# Patient Record
Sex: Female | Born: 1955 | Race: White | Hispanic: No | Marital: Single
Health system: Midwestern US, Community
[De-identification: ages and names within clinical notes are randomized; demographics above are authoritative.]

## PROBLEM LIST (undated history)

## (undated) DIAGNOSIS — J189 Pneumonia, unspecified organism: Secondary | ICD-10-CM

## (undated) DIAGNOSIS — Z9289 Personal history of other medical treatment: Secondary | ICD-10-CM

## (undated) DIAGNOSIS — Z8719 Personal history of other diseases of the digestive system: Secondary | ICD-10-CM

## (undated) DIAGNOSIS — F909 Attention-deficit hyperactivity disorder, unspecified type: Secondary | ICD-10-CM

## (undated) DIAGNOSIS — I341 Nonrheumatic mitral (valve) prolapse: Secondary | ICD-10-CM

## (undated) HISTORY — PX: TUBAL LIGATION: SHX77

## (undated) HISTORY — PX: VAGINAL HYSTERECTOMY: SUR661

## (undated) HISTORY — PX: DE QUERVAIN'S RELEASE: SHX1439

## (undated) HISTORY — PX: FRACTURE SURGERY: SHX138

## (undated) HISTORY — PX: TONSILLECTOMY: SUR1361

---
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
## 2003-11-10 HISTORY — PX: ROUX-EN-Y GASTRIC BYPASS: SHX1104

## 2003-11-10 HISTORY — PX: CHOLECYSTECTOMY: SHX55

## 2005-11-09 DIAGNOSIS — Z9289 Personal history of other medical treatment: Secondary | ICD-10-CM

## 2005-11-09 HISTORY — DX: Personal history of other medical treatment: Z92.89

## 2013-07-31 ENCOUNTER — Emergency Department (HOSPITAL_COMMUNITY): Payer: BC Managed Care – PPO

## 2013-07-31 ENCOUNTER — Emergency Department (HOSPITAL_COMMUNITY)
Admission: EM | Admit: 2013-07-31 | Discharge: 2013-07-31 | Disposition: A | Discharge status: Home / Self Care | Payer: BC Managed Care – PPO | Attending: Emergency Medicine | Admitting: Emergency Medicine

## 2013-07-31 DIAGNOSIS — Z79899 Other long term (current) drug therapy: Secondary | ICD-10-CM | POA: Insufficient documentation

## 2013-07-31 DIAGNOSIS — K920 Hematemesis: Secondary | ICD-10-CM | POA: Insufficient documentation

## 2013-07-31 DIAGNOSIS — Z9889 Other specified postprocedural states: Secondary | ICD-10-CM | POA: Insufficient documentation

## 2013-07-31 DIAGNOSIS — Z880 Allergy status to penicillin: Secondary | ICD-10-CM | POA: Insufficient documentation

## 2013-07-31 LAB — COMPREHENSIVE METABOLIC PANEL
ALT: 18 U/L (ref 0–35)
AST: 28 U/L (ref 0–37)
Albumin: 3.9 g/dL (ref 3.5–5.2)
CO2: 19 mEq/L (ref 19–32)
Calcium: 9 mg/dL (ref 8.4–10.5)
Chloride: 99 mEq/L (ref 96–112)
Creatinine, Ser: 0.55 mg/dL (ref 0.50–1.10)
Potassium: 4 mEq/L (ref 3.5–5.1)
Sodium: 133 mEq/L — ABNORMAL LOW (ref 135–145)
Total Bilirubin: 0.2 mg/dL — ABNORMAL LOW (ref 0.3–1.2)

## 2013-07-31 LAB — LIPASE, BLOOD: Lipase: 22 U/L (ref 11–59)

## 2013-07-31 LAB — CBC
Hemoglobin: 10 g/dL — ABNORMAL LOW (ref 12.0–15.0)
MCH: 21.7 pg — ABNORMAL LOW (ref 26.0–34.0)
MCV: 71.4 fL — ABNORMAL LOW (ref 78.0–100.0)
Platelets: 392 10*3/uL (ref 150–400)
RBC: 4.61 MIL/uL (ref 3.87–5.11)
RDW: 17.5 % — ABNORMAL HIGH (ref 11.5–15.5)
WBC: 7.1 10*3/uL (ref 4.0–10.5)

## 2013-07-31 LAB — PROTIME-INR
INR: 0.95 (ref 0.00–1.49)
Prothrombin Time: 12.5 seconds (ref 11.6–15.2)

## 2013-07-31 MED ORDER — SODIUM CHLORIDE 0.9 % IV SOLN
1000.0000 mL | Freq: Once | INTRAVENOUS | Status: AC
  Administered 2013-07-31: 1000 mL via INTRAVENOUS

## 2013-07-31 MED ORDER — OMEPRAZOLE 20 MG PO CPDR: 20.0000 mg | DELAYED_RELEASE_CAPSULE | Freq: Every day | ORAL | Status: DC

## 2013-07-31 MED ORDER — SUCRALFATE 1 G PO TABS: 1.0000 g | ORAL_TABLET | Freq: Four times a day (QID) | ORAL | Status: DC

## 2013-07-31 MED ORDER — SODIUM CHLORIDE 0.9 % IV SOLN
8.0000 mg/h | INTRAVENOUS | Status: DC
  Administered 2013-07-31: 8 mg/h via INTRAVENOUS
  Filled 2013-07-31 (×2): qty 80

## 2013-07-31 MED ORDER — PANTOPRAZOLE SODIUM 40 MG IV SOLR
40.0000 mg | Freq: Once | INTRAVENOUS | Status: AC
  Administered 2013-07-31: 40 mg via INTRAVENOUS
  Filled 2013-07-31: qty 40

## 2013-07-31 MED ORDER — SODIUM CHLORIDE 0.9 % IV SOLN
1000.0000 mL | INTRAVENOUS | Status: DC
  Administered 2013-07-31: 1000 mL via INTRAVENOUS

## 2013-07-31 MED ORDER — ONDANSETRON HCL 4 MG PO TABS: 4.0000 mg | ORAL_TABLET | Freq: Three times a day (TID) | ORAL | Status: DC | PRN

## 2013-07-31 NOTE — ED Provider Notes (Signed)
CSN: [id number]     Arrival date & time 07/31/13  1026 History   First MD Initiated Contact with Patient 07/31/13 1050     Chief Complaint  Patient presents with  . Emesis   (Consider location/radiation/quality/duration/timing/severity/associated sxs/prior Treatment) HPI Patient presents with concern of multiple episodes of emesis today. She states that prior to today she had no episodes of emesis, but however, had recent generalized illness, described as"flu like". Today, in the hours prior to arrival the patient had sudden onset nausea.  Subsequent she had 5 episodes of emesis.  The material seemed to coffee ground like. No concurrent chest pain, dyspnea, lightheadedness, syncope, significant abdominal pain. Patient has a notable history of prior gastric bypass surgery. And this was performed 7 years ago, after lap band procedure failed. She states that she lost 300 pounds following the bypass procedure.  No past medical history on file. No past surgical history on file. No family history on file. History  Substance Use Topics  . Smoking status: Not on file  . Smokeless tobacco: Not on file  . Alcohol Use: Not on file   OB History   No data available     Review of Systems  Constitutional:       Per HPI, otherwise negative  HENT:       Per HPI, otherwise negative  Respiratory:       Per HPI, otherwise negative  Cardiovascular:       Per HPI, otherwise negative  Gastrointestinal: Positive for nausea and vomiting. Negative for abdominal pain and diarrhea.  Endocrine:       Negative aside from HPI  Genitourinary:       Neg aside from HPI   Musculoskeletal:       Per HPI, otherwise negative  Skin: Negative.   Neurological: Negative for syncope.    Allergies  Penicillins  Home Medications   Current Outpatient Rx  Name  Route  Sig  Dispense  Refill  . amphetamine-dextroamphetamine (ADDERALL) 20 MG tablet   Oral   Take 20 mg by mouth 3 (three) times daily.          . Multiple Vitamins-Minerals (OPTISOURCE PO)   Oral   Take 1 tablet by mouth daily.          SpO2 99% Physical Exam  Nursing note and vitals reviewed. Constitutional: She is oriented to person, place, and time. She appears well-developed and well-nourished. No distress.  HENT:  Head: Normocephalic and atraumatic.  There is trace vomiting about dentures.  Patient is edentulous.  Eyes: Conjunctivae and EOM are normal.  Cardiovascular: Normal rate and regular rhythm.   Pulmonary/Chest: Effort normal and breath sounds normal. No stridor. No respiratory distress.  Abdominal: Soft. Normal appearance. She exhibits no distension. There is no tenderness.  Musculoskeletal: She exhibits no edema.  Neurological: She is alert and oriented to person, place, and time. No cranial nerve deficit.  Skin: Skin is warm and dry.  Psychiatric: She has a normal mood and affect.    ED Course  Procedures (including critical care time) Labs Review Labs Reviewed  CBC  COMPREHENSIVE METABOLIC PANEL  LACTIC ACID, PLASMA  LIPASE, BLOOD  PROTIME-INR   Imaging Review No results found.  12:56 PM On repeat exam the patient is sitting upright.  Vital signs are stable.  We discussed her laboratory findings.  She states that her hemoglobin is actually increased from her last lab check.  Update: I discussed the patient's case with our gastroenterologist on  call.we'll repeat labs, history.  Patient will follow up in the office promptly.  This patient with history of gastric bypass  MDM  No diagnosis found.  This patient with a history of gastric bypass now presents after several episodes of emesis.  On exam she is awake and alert and hemodynamically stable.  Patient has no abdominal tenderness, no additional episodes of emesis, and has had no syncope, chest pain, dyspnea throughout.  Given her description of coffee ground like emesis, there is some suspicion for esophageal or gastric irritation.  Absent  distress, there is low suspicion for early systemic compromise.  Patient's labs are abnormal, though the patient states that they are actually improved from her baseline.  Patient is appropriate for discharge with close outpatient followup, which I discussed with our gastroenterology team.   Gerhard Munch, MD 07/31/13 1258

## 2013-07-31 NOTE — ED Notes (Signed)
Pt comfortable with d/c and f/u instructions. Prescriptions x3 

## 2013-07-31 NOTE — ED Notes (Signed)
EMS called out for c/o coffee ground emesis.  Pt states she started vomiting coffee ground emesis at 0600.  Pt told EMS she had vomited x 6.  Denies any other symptoms.  EMS reports that pt has not vomited with them and they have been with her since 1000.  Pt now denies nausea.  Pt states her BP is normally around 96/60.

## 2016-07-01 HISTORY — PX: ANKLE FRACTURE SURGERY: SHX122

## 2016-07-01 NOTE — Nursing Note (Signed)
Basic Admission Information - Text       Basic Admission Information Adult Entered On:  07/01/2016 15:42 EDT    Performed On:  07/01/2016 15:42 EDT by Anice PaganiniKREIDER, RN, KATHERINE A               Safety   Environmental Safety in Place :   Adequate room lighting, Bed exit alarm, Bed in low position, Call device within reach   Demonstrates Ability to Use Call Light :   Yes   Room Orientation/Facility Policy Reviewed With :   Patient   Ephraim HamburgerKREIDER, RN, KATHERINE A - 07/01/2016 15:42 EDT

## 2016-07-01 NOTE — Nursing Note (Signed)
Medication Administration Follow Up-Text       Medication Administration Follow Up Entered On:  07/01/2016 19:07 EDT    Performed On:  07/01/2016 19:21 EDT by Milagros EvenerHUNNICUTT, RN, Foy GuadalajaraSUZANNE R      Intervention Information:     morphine  Performed by Milagros EvenerHUNNICUTT, RN, SUZANNE R on 07/01/2016 19:06:00 EDT       morphine,5mg   IV Push,Forearm, Mid Right,other (see comment)       Medication Effectiveness Evaluation   Medication Administration Reason :   Pain   Medication Response :   Continue to observe for symptoms   Milagros EvenerHUNNICUTT, RRosalita Chessman, SUZANNE R - 07/01/2016 19:07 EDT

## 2016-07-01 NOTE — Nursing Note (Signed)
Medication Administration Follow Up-Text       Medication Administration Follow Up Entered On:  07/01/2016 13:11 EDT    Performed On:  07/01/2016 13:10 EDT by Marliss Czar, RN, Bridgette Habermann      Intervention Information:     morphine  Performed by Marliss Czar, RN, Bridgette Habermann on 07/01/2016 11:38:00 EDT       morphine,2mg   IV Push,Forearm, Mid Right,moderate pain (4-7)       Medication Effectiveness Evaluation   Medication Administration Reason :   Pain   Medication Effective :   Yes   Medication Response :   Symptoms improved, Continue to observe for symptoms, Provider notified, Other: still c/ pain 10/10 after splinting, PA ordered dilaudid   Marliss Czar, RN, Bridgette Habermann - 07/01/2016 13:10 EDT

## 2016-07-01 NOTE — Procedures (Signed)
 IntraOp Record - [facility]             IntraOp Record - [facility] Summary                                                                   Primary Physician:        FRITZ SHARPER    Case Number:              [id number]    Finalized Date/Time:      07/01/16 22:08:05    Pt. Name:                 Anita Vazquez, Anita Vazquez    D.O.B./Sex:               04-16-1956    Female    Med Rec #:                [id number]    Physician:                FRITZ SHARPER    Financial #:              [id number]    Pt. Type:                 I    Room/Bed:                 0513/01    Admit/Disch:              07/01/16 11:20:00 -    Institution:       [facility] - Case Times                                                                                                         Entry 1                                                                                                          Patient      In Room Time             07/01/16 17:13:00               Out Room Time                   07/01/16 18:48:00    Anesthesia     Procedure  Start Time               07/01/16 17:44:00               Stop Time                       07/01/16 18:45:00    Last Modified By:         Jereld OBIE Camellia GORMAN                              07/01/16 18:47:16      SFOR - Case Times Audit                                                                          07/01/16 18:47:16         Owner: [employer]                               Modifier: HANSER                                                        <+> 1         Out Room Time        <+> 1         Stop Time        SFOR - Safety Checklist - Sign In                                                               Pre-Care Text:            A.10 Confirms patient identity A.20 Verifies operative procedure, surgical site, and laterality A.20.1 Verifies           consent for planned procedure A.30 Verifies allergies                              Entry 1                                                                                                           History/Physical on       Yes  Procedure Consent               Yes    Chart                                                     on Chart     Site Marked (if           Yes    applicable)     Last Modified By:         Jereld OBIE Camellia GORMAN                              07/01/16 18:00:01    Post-Care Text:            E.30 Evaluates verification process for correct patient, site, side, and level surgery      S[facility] - Case Attendance                                                                                                    Entry 1                         Entry 2                         Entry 3                                          Case Attendee             WILDSTEIN-MD,  MICHAEL          GERDING-MD,  REX ELSIE Jereld, RN, Camellia GORMAN    Role Performed            Surgeon Primary                 Anesthesiologist                Circulator    Time In                   07/01/16 17:13:00               07/01/16 17:13:00               07/01/16 17:13:00    Time Out                  07/01/16 18:48:00               07/01/16 18:48:00               07/01/16 18:48:00    Procedure  Ankle ORIF(Right, Ankle)        Ankle ORIF(Right, Ankle)        Ankle ORIF(Right, Ankle)    Last Modified By:         Jereld, RN, Camellia GORMAN Jereld, RN, Camellia GORMAN Jereld, RNCamellia GORMAN                              07/01/16 18:47:17               07/01/16 18:47:17               07/01/16 18:47:17                                Entry 4                         Entry 5                         Entry 6                                          Case Attendee             SHERRILEE,  JESSICA LITTIE CARALEE VINIE DELENA           MASSE-CRNA,  LYNWOOD CHARLESTON    Role Performed            Surgical Scrub                  Anesthesiologist                CRNA    Time In                    07/01/16 17:13:00               07/01/16 18:00:00               07/01/16 18:25:00    Time Out                  07/01/16 18:48:00               07/01/16 18:19:00               07/01/16 18:48:00    Procedure                 Ankle ORIF(Right, Ankle)        Ankle ORIF(Right, Ankle)  Ankle ORIF(Right, Ankle)    Last Modified By:         Jereld, RN, Camellia GORMAN Jereld, RN, Camellia GORMAN Jereld, RNCamellia GORMAN                              07/01/16 18:47:17               07/01/16 18:47:17               07/01/16 18:47:17      SFOR - Case Attendance Audit                                                                     07/01/16 18:47:17         Owner: [employer]                               Modifier: HANSER                                                            1     <+> Time Out            1     <*> Procedure                              Ankle ORIF(Right, Ankle)            2     <+> Time Out            2     <*> Procedure                              Ankle ORIF(Right, Ankle)            3     <+> Time Out            3     <*> Procedure                              Ankle ORIF(Right, Ankle)            4     <+> Time Out            4     <*> Procedure                              Ankle ORIF(Right, Ankle)            5     <*> Procedure  Ankle ORIF(Right, Ankle)            6     <+> Time Out            6     <*> Procedure                              Ankle ORIF(Right, Ankle)     07/01/16 18:25:48         Owner: [employer]                               Modifier: HANSER                                                        <+> 6         Case Attendee        <+> 6         Role Performed        <+> 6         Time In        <+> 6         Procedure     07/01/16 18:19:17         Owner: [employer]                               Modifier: HANSER                                                            2     <-> Time Out                               07/01/16 18:01:00            2      <*> Procedure                              Ankle ORIF(Right, Ankle)            5     <+> Time Out            5     <*> Procedure                              Ankle ORIF(Right, Ankle)     07/01/16 18:17:54         Owner: [employer]                               Modifier: HANSER  1     <*> Procedure                              Ankle ORIF(Right, Ankle)            2     <+> Time In            2     <+> Time Out            2     <*> Procedure                              Ankle ORIF(Right, Ankle)            3     <+> Time In            3     <*> Procedure                              Ankle ORIF(Right, Ankle)            4     <+> Time In            4     <*> Procedure                              Ankle ORIF(Right, Ankle)        <+> 5         Case Attendee        <+> 5         Role Performed        <+> 5         Time In        <+> 5         Procedure     07/01/16 17:51:26         Owner: [employer]                               Modifier: HANSER                                                            1     <+> Time In            1     <*> Procedure                              Ankle ORIF(Right, Ankle)        <+> 2         Case Attendee        <+> 2         Role Performed        <+> 2         Procedure        <+> 3         Case Attendee        <+> 3         Role Performed        <+>  3         Procedure        <+> 4         Case Attendee        <+> 4         Role Performed        <+> 4         Procedure        SFOR - Skin Assessment                                                                          Pre-Care Text:            A.240 Assesses baseline skin condition Im.120 Implements protective measures to prevent skin or tissue injury           due to mechanical sources  Im.280.1 Implements progective measures to prevent skin or tissue injury due to           thermal sources Im.360 Monitors for signs and symptons of infection                              Entry 1                                                                                                           Skin Integrity            Not Intact/Abnormality          Abnormality                     RT. ANKLE IN SUGAR TONG                                                              Description                     SPLINT, REMOVED PER DR.                                                                                              ANDA PRIOR TO  PROCEDURE    Last Modified By:         Jereld, RN, Camellia RAMAN                              07/01/16 18:00:57    Post-Care Text:            E.10 Evaluates for signs and symptoms of physical injury to skin and tissue E.270 Evaluate tissue perfusion           O.60 Patient is free from signs and symptoms of injury caused by extraneous objects   O.210 Patinet's tissue           perfusion is consistent with or improved from baseline levels      SFOR - Patient Positioning                                                                      Pre-Care Text:            A.240 Assesses baseline skin condition A.280 Identifies baseline musculoskeletal status A.280.1 Identifies           physical alterations that require additional precautions for procedure-specific positioning A.510.8 Maintains           patient's dignity and privacy Im.120 Implements protective measures to prevent skin/tissue injury due to           mechanical sources Im.40 Positions the patient Im.80 Applies safety devices                              Entry 1                                                                                                          Procedure                 Ankle ORIF(Right, Ankle)        Body Position                   Supine    Left Arm Position         Extended on Padded Arm          Right Arm Position              Extended on Padded Arm                              Board w/Security Strap  Board w/Security Strap    Left Leg Position         Extended Security Strap         Right Leg Position              Held on [employer] Uncrossed            Yes                             Pressure Points                 Yes                                                              Checked     Positioning Device        Rolled Blanket, Pillow          Positioned By                   [place], RN, Camellia GORMAN RECORDS,  MICHAEL    Outcome Met (O.80)        Yes    Last Modified By:         Jereld RN, Camellia GORMAN                              07/01/16 17:59:51    Post-Care Text:            E.10 Evaluates for signs and symptoms of physical injury to skin and tissue E.290 Evaluates musculoskeletal           status O.80 Patient is free from signs and symptoms of injury related to positioning O.120 the patient is free           from signs and symptoms of injury related to transfer/transport  O.250 Patient's musculoskeletal status is           maintained at or improved from baseline levels    General Comments:            BUMP UNDER RT. HIP      SFOR - Skin Prep                                                                                Pre-Care Text:  A.30 Verifies allergies A.20 Verifies procedure, surgical site, and laterality A.510.8 Maintains paritnet's           dignity and privacy Im.270 Performs Skin Preparation Im.270.1 Implements protective measures to prevent skin           and tissue injury due to chemical sources  A.300.1 Protects from cross-contamination                              Entry 1                                                                                                          Hair Removal     Skin Prep      Prep Agents (Im.270)     Chlorhexidine Gluconate         Prep Area (Im.270)              Ankle                              2% w/Alcohol     Prep By                   FRITZ SHARPER    Outcome Met (O.100)       Yes    Last Modified By:         Jereld, RN, Camellia RAMAN                              07/01/16 18:01:41    Post-Care Text:            E.10 Evaluates for signs and symptoms of physical injury to skin and tissue O.100 Patient is free from signs           and symptoms of chemical injury  O.740 The patient's right to privacy is maintained    General Comments:            PRE PREP PER DR. ANDA WITH CHLORAHEXIDINE SCRUB BRUSH OF THE FOOT AND ANKLE      SFOR - Counts Initial and Final                                                                 Pre-Care Text:            A.20 Verifies operative procedure, sugical site, and laterality A.20.2 Assesses the risk for unintended           retained foreign body Im.20 Performs required counts                              Entry  1                                                                                                          Initial Counts      Initial Counts           Hanselman, RN, Camellia RAMAN,          Items included in               Sponges, Sharps     Performed By             [employer],  JESSICA L            the Initial Count     Final Counts      Final Counts             Hanselman, RN, Camellia RAMAN,          Final Count Status              Correct     Performed By             SHERRILEE,  JESSICA L     Items Included in        Sponges, Sharps     Final Count     Outcome Met (O.20)        Yes    Last Modified By:         Jereld RNCamellia RAMAN                              07/01/16 18:47:54    Post-Care Text:            E.50 Evaluates results of the surgical count O.20 Patient is free from unintended retained foreign objects      SFOR - Counts Initial and Final Audit                                                            07/01/16 18:47:54         Owner: [employer]                               Modifier: HANSER                                                        <+> 1         Final Count Status        SFOR - Counts  Additional  Pre-Care Text:            A.20 Verifies operative procedure, sugical site, and laterality A.20.2 Assesses the risk for unintended           retained foreign body Im.20 Performs required counts                              Entry 1                                                                                                          Additional Count          Closing Count                   Additional Count                MCKENZIE,  JESSICA L,    Type                                                      Participants                    Hanselman, RN, Camellia RAMAN    Count Status              Correct                         Items Counted                   Sponges, Sharps    Outcome Met (O.20)        Yes    Last Modified By:         Jereld RN, Camellia RAMAN                              07/01/16 18:18:14    Post-Care Text:            E.50 Evaluates results of the surgical count O.20 Patient is free from unintended retained foreign objects      SFOR - General Case Data                                                                        Pre-Care Text:            A.350.1 Classifies surgical wound  Entry 1                                                                                                          Case Information      ASA Class                1                               Case Level                      Level 3     OR                       SF 08                           Specialty                       Orthopedic (SN)     Wound Class              1-Clean    Preop Diagnosis           RIGHT BIMALLEOLAR                              FRACTURE    Last Modified By:         Jereld OBIE Camellia GORMAN                              07/01/16 19:08:56    Post-Care Text:            O.760 Patient receives consistent and comparable care regardless of the setting      [facility] - General Case Data Audit                                                                    07/01/16 19:08:56         Owner: [employer]                               Modifier: HANSER                                                            1     <*> OR  SF Add On        SFOR - Fire Risk Assessment                                                                                               Entry 1                                                                                                          Fire Risk                 Alcohol Based Prep              Fire Risk Score                 2    Assessment: If            Solution, Ignition    checked, checkmark        Source In Use    = 1 point     Last Modified By:         Jereld OBIE Camellia GORMAN                              07/01/16 17:58:55      SFOR - Safety Checklist - Time Out                                                              Pre-Care Text:            A.10 Confirms patient identity A.20 Verifies operative procedure, surgical site, and laterality A.20.1 Verifies           consent for planned procedure A.30 Verifies allergies                              Entry 1  Surgical/Procedure        Yes                             Time Out Complete               07/01/16 17:38:00    Team confirms     correct patient,     correct site and     correct procedure     Last Modified By:         Jereld RN, Camellia RAMAN                              07/01/16 18:00:17    Post-Care Text:            E.30 Evaluates verification process for correct patient, site, side, and level surgery      SFOR - Cautery                                                                                  Pre-Care Text:            A.240 Assesses baseline skin condition A280.1 Identifies baseline musculoskeletal status Im.50 Implements           protective measures to prevent injury due to electrical sources  Im.60 Uses  supplies and equipment within safe           parameters Im.80 Applies safety devices                              Entry 1                                                                                                          ESU Type                  GENERATOR                       Identification                  [id number]                              [employer]OVIDI[employer]              Number     Coag Setting (watts)      35                              Cut Setting (  watts)             1    Grounding Pad             Yes                             Grounding Pad Site              Thigh, right    Needed?     Grounding Pastor Neptune, RN, Camellia RAMAN           Outcome Met (O.10)              Yes    Applied By     Last Modified By:         Neptune RNCamellia RAMAN                              07/01/16 17:57:52    Post-Care Text:            E.10 Evaluates for signs and symptoms of physical injury to skin and tissue O.10 Patient is free from signs and           symptoms of injury related to thermal sources  O.70 Patient is free from signs and symptoms of electrical injury      SFOR - Patient Care Devices                                                                     Pre-Care Text:            A.200 Assesses risk for normothermia regulation A.40 Verifies presence of prosthetics or corrective devices           Im.280 Implements thermoregulation measures Im.60 Uses supplies and equipment within safe parameters                              Entry 1                                                                                                          Equipment Type            MACHINE SEQUENTIAL              SCD Sleeve Site                 Leg Right                              COMPRESSION    Equipment/Tag Number  [id number]                           Initiated Pre                   Yes                                                              Induction     Last Modified By:         Jereld RN, Camellia RAMAN                               07/01/16 17:59:23    Post-Care Text:            E.10 Evaluates signs and symptoms of physical injury to skin and tissue O.60 Patient is free from signs and           symptoms of injury caused by extraneous objects      SFOR - Tourniquet                                                                               Pre-Care Text:            A.20 Verifies procedure, surgical site, and laterality A.220.2 Identifies baseline tissue perfusion A.240           Assesses baseline skin condition A.40 Verifies presence of prosthetics or corrective devices Im.120 Implements           protective measures to prevent skin or tissue injury due to mechanical sources  Im.60 Uses supplies and           equipement within safe parameters Im.80 Applies safety devices                              Entry 1                                                                                                          Tourniquet Type           TOURNIQUET PNEUMATIC            Serial Number                   [id number]    Setting                   250 mmHg  Placement                       Ankle Right    Padding (Im.120)          Yes    Tourniquet Times      Inflated                 07/01/16 17:43:00               Deflated                        07/01/16 18:36:00    Applied By                Jereld RN, Eric S           Outcome Met (O.60)              Yes    Last Modified By:         Jereld, RN, Camellia RAMAN                              07/01/16 18:41:34    Post-Care Text:            E.10 Evaluates for signs and symptoms of physical injury to skin and tissue E. 270 Evaluates tissue perfusion           O.210 Patient's tissue perfusion is consisent with or improved from baseline levels O.30 Patient's procedure is           performed on the correct site, side, level O.60 Patient is free from sign and symptoms of injury caused by           extraneous objects    General Comments:            53 MINUTES      SFOR - Tourniquet Audit                                                                           07/01/16 18:41:34         Owner: [employer]                               Modifier: HANSER                                                            1     <*> Tourniquet Type            1     <*> Tourniquet Type            1     <*> Tourniquet Type            1     <*> Tourniquet Type  TOURNIQUET PNEUMATIC            1     <*> Tourniquet Type                        TOURNIQUET PNEUMATIC            1     <*> Tourniquet Type                        TOURNIQUET PNEUMATIC            1     <*> Tourniquet Type                        TOURNIQUET PNEUMATIC            1     <*> Deflated            1     <*> Deflated            1     <*> Deflated        SFOR - Implants/Endoscopy Stents                                                                Pre-Care Text:            A.20 Verifies operative procedure, surgical site, and laterality A.20.1 Verifies consent for planned procedure           Im.350 Records implants inserted during the operative or invasive procedure                              Entry 1                         Entry 2                         Entry 3                                          Implant/Explant           Implant                         Implant                         Implant    Catalog #                [id number]                         212.103    Implant     Identification      Description              SCREW CORTEX 3.5MM X  SCREW CANCELLOUS 4.0MM          SCREW LOCKING 3.5MM X                              SYNTHES 204.814            X F/T SYNTHES              SYNTHES 212.103                                                              206.012     Expiration Date      Lot Number      Unique ID Number      Manufacturer             [employer]                      [employer]                      [employer]      Serial Number     Usage Data      Implant Site              RT. ANKLE                       RT. ANKLE                       RT. ANKLE     Quantity                 2                               1                               1     Last Modified By:         Jereld, RN, Camellia GORMAN Jereld, RN, Camellia GORMAN Jereld, RNCamellia GORMAN                              07/01/16 18:12:02               07/01/16 18:12:02               07/01/16 18:12:02                                Entry 4                         Entry 5  Implant/Explant           Implant                         Implant    Catalog #                [id number]                         [id number]    Implant     Identification      Description              PLATE LCP ONE-THIRD             SCREW CORTEX 3.5MM X                              TUBULAR 6 HOLE             SY[employer]THES 204.840                              SYNTHES 241.361     Expiration Date      Lot Number      Unique ID Number      Manufacturer             [employer]                      [employer]      Serial Number     Usage Data      Implant Site             RT. ANKLE                       RT. ANKLE     Quantity                 1                               2    Last Modified By:         Jereld, RN, Camellia GORMAN Jereld, RNCamellia GORMAN                              07/01/16 18:12:02               07/01/16 18:25:18    Post-Care Text:            E.30 Evaluates verification process for correct patient, site, side and level surgery O.30 Patient's procedure           is performed on the correct site, side, and level      [facility] - Implants/Endoscopy Stents Audit                                                           07/01/16 18:25:18         Owner: [employer]  Modifier: HANSER                                                        <+> 5         Description        <+> 5         Manufacturer        <+> 5         Implant Site        <+> 5          Quantity        <+> 5         Catalog #        <+> 5         Implant/Explant        [facility] - Communication                                                                            Pre-Care Text:            A.520 Identifies barriers to communication (Patient and Family Communications) A.20 Verifies operative           procedure, surgical site, and laterality (Hand-off Communications) Im.500 Provides status reports to family           members Im.150 Develops individualized plan of care                              Entry 1                                                                                                          Communication             Phone Call                      Communication By                FRITZ SHARPER    Last Modified By:         Jereld OBIE Camellia GORMAN                              07/01/16 17:58:11    Post-Care Text:            E.520 Evaluates psychosocial response to plan of care O.500 Patient or designated support person demonstrates           knowledge of the expected psychosocial responses to  the procedure E.800 Ensures continuity of care O.50           Patient's current status is communicated throughout the continuum of care      [facility] - Dressing/Packing                                                                         Pre-Care Text:            A.350 Assesses susceptibility for infection Im.250 Administers care to invasive devices Im.290 Administer care           to wound sites  Im.300 Implements aseptic technique                              Entry 1                                                                                                          Site                      Ankle                           Site Details                    Right    Dressing Item     Details      Dressing Item            Medicated Gauze, 4x4's,         Cast/Splint (Im.290)            Cast Padding, Plaster     (Im.290)                 Elastic wrap                                                     Cast    Last Modified By:         Jereld RN, Camellia RAMAN                              07/01/16 18:18:57    Post-Care Text:            E.320 Evaluate factors associted with increased risk for postoperative infection at the completion of the           procedure O.200 Patient's wound perfusion is consistent with or improved from baseline levels  O.Patient is  free from signs and symptoms of infection      [facility] - Procedures                                                                               Pre-Care Text:            A.20 Verifies operative procedure, surgical site, and laterality Im.150 Develops individualized plan of care                              Entry 1                                                                                                          Procedure     Description      Procedure                Ankle ORIF                      Modifiers                       Right, Ankle     Surgical Procedure       RIGHT ANKLE ORIF     Text     Primary Procedure         Yes                             Primary Surgeon                 FRITZ SHARPER    Start                     07/01/16 17:44:00               Stop                            07/01/16 18:45:00    Anesthesia Type           General                         Surgical Service                Orthopedic (SN)    Wound Class               1-Clean    Last Modified By:         Jereld RN, Camellia RAMAN  07/01/16 18:47:19    Post-Care Text:            O.730 The patient's care is consistent with the individualized perioperative plan of care      [facility] - Procedures Audit                                                                          07/01/16 18:47:19         Owner: [employer]                               Modifier: HANSER                                                        <+> 1         Stop        [facility] - Safety Checklist - Sign Out                                                              Pre-Care  Text:            Im.330 Manages specimen handling and disposition                              Entry 1                                                                                                          Patient Safety            Yes    Communication Guide     Used Throughout Case     Last Modified By:         Jereld OBIE Camellia GORMAN                              07/01/16 18:47:24    Post-Care Text:            E.800 Ensures continuity of care E.50 Evaluates results of the surgical count O.30 Patient's procedure is           performed on the correct site, side, and level O.50 patient's current status is communicated throughout the           continuum of  care O.40 Patient's specimen(s) is managed in the appropriate manner      [facility] - Transfer                                                                                                           Entry 1                                                                                                          Transferred By            Jereld, RN, Camellia RAMAN,          Via                             Bed                              GERDING-MD,  TRESEA FALLOW    Post-op Destination       PACU    Skin Assessment      Condition                Intact    Last Modified By:         Jereld OBIE Camellia RAMAN                              07/01/16 18:03:51      Case Comments                                                                                         <None>              Finalized By: Jereld OBIE Camellia RAMAN      Document Signatures  Signed By:           Jereld OBIE Camellia GORMAN 07/01/16 18:47          Jereld, RN, Camellia GORMAN 07/01/16 19:09          Jereld, RN, Camellia GORMAN 07/01/16 22:08      Unfinalized History                                                                                     Date/Time            Username    Reason for Unfinalizing         Freetext Reason for Unfinalizing                                           07/01/16 19:08       HANSER      Modify Pick List          07/01/16 21:59       HANSER      Modify Pick List

## 2016-07-01 NOTE — Nursing Note (Signed)
Medication Administration Follow Up-Text       Medication Administration Follow Up Entered On:  07/01/2016 18:57 EDT    Performed On:  07/01/2016 19:11 EDT by Milagros EvenerHUNNICUTT, RN, Foy GuadalajaraSUZANNE R      Intervention Information:     morphine  Performed by Milagros EvenerHUNNICUTT, RN, SUZANNE R on 07/01/2016 18:56:00 EDT       morphine,5mg   IV Push,Forearm, Mid Right,other (see comment)       Medication Effectiveness Evaluation   Medication Administration Reason :   Pain   Medication Response :   Continue to observe for symptoms   Milagros EvenerHUNNICUTT, RFoy Guadalajara, SUZANNE R - 07/01/2016 18:57 EDT

## 2016-07-01 NOTE — Nursing Note (Signed)
Medication Administration Follow Up-Text       Medication Administration Follow Up Entered On:  07/01/2016 19:37 EDT    Performed On:  07/01/2016 19:45 EDT by Milagros EvenerHUNNICUTT, RN, Foy GuadalajaraSUZANNE R      Intervention Information:     lorazepam  Performed by Milagros EvenerHUNNICUTT, RN, SUZANNE R on 07/01/2016 19:30:00 EDT       lorazepam,0.5mg   IV Push,Forearm, Mid Right,anxiety       Medication Effectiveness Evaluation   Medication Administration Reason :   Anxiety, Pain   Medication Response :   Continue to observe for symptoms   Milagros EvenerHUNNICUTT, RFoy [place], SUZANNE R - 07/01/2016 19:37 EDT

## 2016-07-01 NOTE — Nursing Note (Signed)
Adult Patient History Form-Text       Adult Patient History Entered On:  07/01/2016 14:39 EDT    Performed On:  07/01/2016 14:32 EDT by Mitzi HansenMOODY, RN, Anita CrouchJEAN Vazquez               General Info   In Charge of News (ICON) Name :   Anita LionsMolly Vazquez-daughter [phone]   MOODY, RN, Anita CrouchJEAN Vazquez - 07/01/2016 14:39 EDT   Preferred Name :   Anita Vazquez   Admitted From :   ER   Mode of Arrival on Unit :   Stretcher   Information Given By :   Self   Primary Language :   English   Pregnancy Status :   Patient denies   Has the patient received chemotherapy or biotherapy within the last 48 hours? :   No   Is the patient currently (2-3 days) receiving radiation treatment? :   No   MOODY, RN, Anita Vazquez - 07/01/2016 14:32 EDT   Problem History   (As Of: 07/01/2016 14:39:22 EDT)   Problems(Active)    Anemia (SNOMED CT  :ACSYtQDsdrRUkXtoqf79/w )  Name of Problem:   Anemia ; Recorder:   CANADY, RN, BROOKE Vazquez; Confirmation:   Confirmed ; Classification:   Medical ; Code:   ACSYtQDsdrRUkXtoqf79/w ; Contributor System:   DietitianowerChart ; Last Updated:   05/19/2016 7:01 EDT ; Life Cycle Date:   05/19/2016 ; Life Cycle Status:   Active ; Vocabulary:   SNOMED CT        Gastric bypass (SNOMED CT  :[phone] )  Name of Problem:   Gastric bypass ; Recorder:   Ronny FlurryWORTHINGTON,  Anita Vazquez; Confirmation:   Confirmed ; Classification:   Medical ; Code:   [phone] ; Contributor System:   DietitianowerChart ; Last Updated:   05/19/2016 7:07 EDT ; Life Cycle Date:   05/19/2016 ; Life Cycle Status:   Active ; Vocabulary:   SNOMED CT   ; Comments:        05/19/2016 7:07 - Ronny FlurryWORTHINGTON,  Anita Vazquez  1997        Diagnoses(Active)    Ankle injury - Minor  Date:   07/01/2016 ; Diagnosis Type:   Reason For Visit ; Confirmation:   Complaint of ; Clinical Dx:   Ankle injury - Minor ; Classification:   Medical ; Clinical Service:   Emergency medicine ; Code:   PNED ; Probability:   0 ; Diagnosis Code:   [id number]      Fall  Date:   07/01/2016 ; Diagnosis Type:   Discharge ; Confirmation:    Confirmed ; Clinical Dx:   Fall ; Classification:   Medical ; Clinical Service:   Non-Specified ; Code:   ICD-10-CM ; Probability:   0 ; Diagnosis Code:   [id number]      Fx bimalleolar-closed  Date:   07/01/2016 ; Diagnosis Type:   Discharge ; Confirmation:   Confirmed ; Clinical Dx:   Fx bimalleolar-closed ; Classification:   Medical ; Clinical Service:   Non-Specified ; Code:   ICD-10-CM ; Probability:   0 ; Diagnosis Code:   [id number]        Family History   Family History   (As Of: 07/01/2016 14:39:22 EDT)     Allergy   (As Of: 07/01/2016 14:39:22 EDT)   Allergies (Active)   penicillins  Estimated Onset Date:   Unspecified ; Created ByPatsi Sears:   CANADY, RN, BROOKE Vazquez; Reaction Status:   Active ; Category:  Drug ; Substance:   penicillins ; Type:   Allergy ; Severity:   Severe ; Updated By:   Annamarie Major; Reviewed Date:   07/01/2016 14:34 EDT        Immunizations   Influenza Vaccine Status :   Non-influenza season (before Oct 1st and after Mar 31st)   MOODY, RN, Anita Vazquez - 07/01/2016 14:32 EDT   ID Risk Screen   Patient Recent Travel History :   No recent travel   Family Member Travel History :   No recent travel   MOODY, RN, Anita Vazquez - 07/01/2016 14:32 EDT   Infectious Disease Risk Factor Grid   Chills :   No   Fever :   No   Fatigue :   No   Headache :   No   Runny or Stuffy Nose :   No   Sore Throat :   No   Difficulty Breathing :   No   Shortness of Breath :   No   New or Worsening Cough :   No   Wheezing :   No   Vomiting :   No   Diarrhea :   No   Abdominal (Stomach Pain) :   No   Muscle Pain :   No   Weakness/Numbness :   No   Recent Exposure to Communicable Disease :   No   Illness With Generalized Rash :   No   Abnormal Bleeding :   No   Unexplained Hemorrhage (Bleeding or Bruising) :   No   Arthralgia :   No   Conjunctivitis :   No   MOODY, RN, Anita Vazquez - 07/01/2016 14:32 EDT   MRSA/VRE Screening :   No   MOODY, RN, Anita Vazquez - 07/01/2016 14:32 EDT   Chills :   No   Cough (Any Duration) :   No   Fever :   No    Hemoptysis (Blood in Sputum) :   No   Night Sweats :   No   Weight Loss Greater Than 10 Pounds :   No   Hx of TB Now or at Any Time In the Past (Even if on Meds) :   No   Foreign-Born :   No   Homeless or In Shelter :   No   Incarcerated Within Last 2 Years :   No   Intravenous Drug User :   No   Female Homosexual :   No   New TST/IGRA Results Pos(Within 2 yrs),Hx Recent TB Exposure :   No   MOODY, RN, Anita Vazquez - 07/01/2016 14:32 EDT   Does the Patient Have any of the Following Conditions That Compromise the Immune System :   None   MOODY, RN, Anita Vazquez - 07/01/2016 14:32 EDT   C. diff Screen   Have you had 3 or more loose/watery stool in 24 hours? :   No   MOODY, RN, Anita Vazquez - 07/01/2016 14:32 EDT   Procedure History        -    Procedure History   (As Of: 07/01/2016 14:39:22 EDT)     Anesthesia Minutes:   0 ; Procedure Name:   Gastric bypass operation ; Procedure Minutes:   0 ; Last Reviewed Dt/Tm:   07/01/2016 14:35:46 EDT            Anesthesia Minutes:   0 ; Procedure Name:   C section delivery ;  Procedure Minutes:   0 ; Last Reviewed Dt/Tm:   07/01/2016 14:35:46 EDT            Anesthesia Minutes:   0 ; Procedure Name:   Cholecystectomy ; Procedure Minutes:   0 ; Last Reviewed Dt/Tm:   07/01/2016 14:35:46 EDT            Anesthesia/Sedation   Anesthesia History :   Prior general anesthesia   Anesthesia Reaction :   None   Moderate Sedation History :   Prior sedation for procedure   MOODY, RN, Anita Vazquez - 07/01/2016 14:32 EDT   Transfusion/Bloodless Med   Transfusion History :   Prior transfusion without reaction   Will Patient Accept Blood Transfusion and/or Blood Products :   Yes   MOODY, RN, Anita Vazquez - 07/01/2016 14:32 EDT   Nutrition   Home Diet :   Regular   Appetite :   Good   Feeding Ability :   Complete independence   Unintentional Weight Change Greater Than 10 lbs in the Last 6 Months :   No   MOODY, RN, Anita Vazquez - 07/01/2016 14:32 EDT   Functional   Cognitive Function Concerns Prior to Admission :   None reported   Ability to  Ambulate Prior to Admission :   Independent   Ability to Move in Bed Prior to Admission :   Independent   ADLs :   Independent   MOODY, RN, Anita CrouchJEAN Vazquez - 07/01/2016 14:32 EDT   Living and Resources   Lines/Tubes Present on Admission :   None   MOODY, RN, Anita CrouchJEAN Vazquez - 07/01/2016 14:32 EDT   Social History   Social History   (As Of: 07/01/2016 14:39:22 EDT)   Tobacco:        Never smoker, Cigarettes   (Last Updated: 07/01/2016 08:06:54 EDT by Marliss CzarLeigh, RN, Bridgette HabermannKerry A)          Alcohol:        Current, Wine, 1-2 times per week   (Last Updated: 07/01/2016 14:38:39 EDT by MOODY, RN, Anita Vazquez)            Sexual Assault/Domestic Violence Screen   Have You Been Physically Hurt by Someone Within the Past Year :   No   MOODY, RN, Anita CrouchJEAN Vazquez - 07/01/2016 14:32 EDT   Spiritual   Do You Receive Comfort From Spiritual Practices :   Yes   Religious Preference :   [employer] [place], [employer]   MOODY, RN, Anita CrouchJEAN Vazquez - 07/01/2016 14:32 EDT   Advance Directive   *Advance Directive :   No   MOODY, RN, Anita CrouchJEAN Vazquez - 07/01/2016 14:32 EDT   Educ Needs   Patient/Family Learning Style Preferences   Patient :   Verbal explanation   MOODY, RN, Anita Vazquez - 07/01/2016 14:32 EDT   Barriers to Learning :   None evident   Preventative Measures Information Given :   Hand Hygiene, Environmental Safety, Rapid Response Team (RRT) program, Unit/Room Orientation   MOODY, RN, Anita Vazquez - 07/01/2016 14:32 EDT   DC Needs   Discharge To :   Home independently   MOODY, RN, Anita CrouchJEAN Vazquez - 07/01/2016 14:32 EDT

## 2016-07-01 NOTE — Nursing Note (Signed)
Medication Administration Follow Up-Text       Medication Administration Follow Up Entered On:  07/01/2016 19:25 EDT    Performed On:  07/01/2016 19:28 EDT by Milagros Evener, RN, Foy Guadalajara      Intervention Information:     lorazepam  Performed by Milagros Evener RN, SUZANNE R on 07/01/2016 19:13:00 EDT       lorazepam,0.5mg   IV Push,Forearm, Mid Right,anxiety       Medication Effectiveness Evaluation   Medication Administration Reason :   Anxiety, Pain   HUNNICUTT, RN, SUZANNE R - 07/01/2016 19:37 EDT     Medication Response :   Continue to observe for symptoms   Milagros Evener RNFoy Guadalajara - 07/01/2016 19:25 EDT

## 2016-07-02 NOTE — Nursing Note (Signed)
Medication Administration Follow Up-Text       Medication Administration Follow Up Entered On:  07/02/2016 5:40 EDT    Performed On:  07/02/2016 3:34 EDT by Shirl HarrisASSIN, RN, EDWIN L      Intervention Information:     acetaminophen-hydrocodone  Performed by Shirl HarrisASSIN, RN, EDWIN L on 07/02/2016 02:34:00 EDT       HYDROcodone-acetaminophen,2tabs  Oral,moderate pain (4-7)       Medication Effectiveness Evaluation   Medication Administration Reason :   Pain   Medication Effective :   Yes   Medication Response :   Symptoms improved   TASSIN, RN, EDWIN L - 07/02/2016 5:39 EDT   Pain Assessment   Numeric Rating Pain Scale :   4   TASSIN, RN, EDWIN L - 07/02/2016 5:39 EDT   Image 4 -  Images currently included in the form version of this document have not been included in the text rendition version of the form.

## 2016-07-02 NOTE — Nursing Note (Signed)
Medication Administration Follow Up-Text       Medication Administration Follow Up Entered On:  07/02/2016 17:13 EDT    Performed On:  07/02/2016 12:53 EDT by Matthias Hughs'ANGELO, RN, ALEXANDRA      Intervention Information:     ondansetron  Performed by Matthias Hughs'ANGELO, RN, ALEXANDRA on 07/02/2016 12:38:00 EDT       ondansetron,4mg   IV Push,Forearm, Mid Left,nausea/vomiting       Medication Effectiveness Evaluation   Medication Administration Reason :   Nausea   Medication Effective :   Yes   Medication Response :   Symptoms improved   D'ANGELO, RN, ALEXANDRA - 07/02/2016 17:13 EDT

## 2016-07-02 NOTE — Progress Notes (Signed)
 Inpatient PT Examination - Text       Inpatient PT Examination Entered On:  07/02/2016 13:28 EDT    Performed On:  07/02/2016 13:14 EDT by STORHOLT, PT, CASSIE W               Reason for Treatment   Subjective Statement :   Pt found supine, requires min encouragement to attempt PT eval 2* pt anxiety about getting up for 1st time. RN cleared pt for session. How am I going to walk? i laid on the floor for 12 hours     *Reason for Referral :   Pt admit c fall, R ankle fx s/p ORIF 8/23    fall precautions, R LE NWB, IV    Pt reports she was ind amb s AD prior to admit. [place] c large flight of rickedy steps to enter. Owns no DME. No family/ neighbors available to A. Works as IT consultant.     *Chief Complaint :   R LE pain 6/10 c movement     STORHOLT, PT, CASSIE W - 07/02/2016 13:14 EDT   General Info   Physical Therapy Orders :   Physical Therapy Evaluation and Treatment Acute - 07/02/16 2:22:00 EDT, AM performed - prior to noon, Stop date 07/02/16 2:22:00 EDT, NWB LLE     Precautions RTF :    Communication Order, 07/02/16 2:22:00 EDT, Constant order, elevate affected ankle above heart level for 48 hours.  Ice pack to affected ankle PRN, 07/02/16 2:22:00 EDT, Ordered   Notify Provider, 07/02/16 2:22:00 EDT, Courtesy notify of patient's procedure, 07/02/16 2:22:00 EDT, 07/02/16 2:22:00 EDT, Ordered   Notify Provider Vital Signs, 07/02/16 2:22:00 EDT, SBP greater than 180, SBP less than 90, Ordered   Notify Provider Vital Signs, 07/02/16 2:22:00 EDT, O2 sat less than 90, Ordered   Notify Provider Vital Signs, 07/02/16 2:22:00 EDT, RR greater than 30, Ordered   Notify Provider Vital Signs, 07/02/16 2:22:00 EDT, HR greater than 120, HR less than 50, Ordered   Notify Provider Vital Signs, 07/02/16 2:22:00 EDT, UO less than 120 mL per 4hrs, Ordered   Notify Provider Vital Signs, 07/02/16 2:22:00 EDT, Temp greater than 38.3, Ordered   Notify Rapid Response Team, 07/02/16 2:22:00 EDT, For concerns  regarding patient condition & notify MD, 07/02/16 2:22:00 EDT, 07/02/16 2:22:00 EDT, Ordered   Oxygen Therapy, 07/02/16 2:22:00 EDT, L/min: 2, Nasal Cannula, titrate for SpO2 greater than or equal to 92%, Ordered   Communication Order, 07/01/16 14:38:00 EDT, NPO: Per Anesthesia Protocol, 07/01/16 14:38:00 EDT, 07/01/16 14:38:00 EDT, Ordered   Notify Rapid Response Team, 07/01/16 14:38:00 EDT, For concerns regarding patient condition & notify MD, 07/01/16 14:38:00 EDT, 07/01/16 14:38:00 EDT, Ordered   Home Med Hx Update Notification, 07/01/16 14:14:00 EDT, 07/01/16 14:14:00 EDT, 07/01/16 14:14:00 EDT, Ordered   Change attending to, 07/01/16 11:20:00 EDT, FRITZ SHARPER, Ordered     Orientation Assessment :   Oriented x 4   Affect/Behavior :   Fearful   Basic Command Following :   Intact   Safety/Judgment :   Intact   Pain Present :   Yes actual or suspected pain   STORHOLT, PT, CASSIE W - 07/02/2016 13:14 EDT   Problem List   (As Of: 07/02/2016 13:28:38 EDT)   Problems(Active)    Alteration in comfort: pain (SNOMED CT  :65759985 )  Name of Problem:   Alteration in comfort: pain ; Recorder:   SYSTEM,  SYSTEM; Confirmation:   Confirmed ;  Classification:   Interdisciplinary ; Code:   [id number] ; Last Updated:   07/02/2016 6:20 EDT ; Life Cycle Date:   07/02/2016 ; Life Cycle Status:   Active ; Vocabulary:   SNOMED CT   ; Comments:        07/02/2016 6:20 - SYSTEM,  SYSTEM  Problem added automatically by system based on initiation of Alteration in Comfort Plan of Care      Anemia (SNOMED CT  :ACSYtQDsdrRUkXtoqf79/w )  Name of Problem:   Anemia ; Recorder:   CANADY, RN, BROOKE N; Confirmation:   Confirmed ; Classification:   Medical ; Code:   ACSYtQDsdrRUkXtoqf79/w ; Contributor System:   PowerChart ; Last Updated:   05/19/2016 7:01 EDT ; Life Cycle Date:   05/19/2016 ; Life Cycle Status:   Active ; Vocabulary:   SNOMED CT        Gastric bypass (SNOMED CT  :7180576986 )  Name of Problem:   Gastric bypass ; Recorder:    VIKI NEARING; Confirmation:   Confirmed ; Classification:   Medical ; Code:   7180576986 ; Contributor System:   PowerChart ; Last Updated:   05/19/2016 7:07 EDT ; Life Cycle Date:   05/19/2016 ; Life Cycle Status:   Active ; Vocabulary:   SNOMED CT   ; Comments:        05/19/2016 7:07 - VIKI NEARING  1997      Impaired tissue integrity (SNOMED CT  :18596984 )  Name of Problem:   Impaired tissue integrity ; Recorder:   SYSTEM,  SYSTEM; Confirmation:   Confirmed ; Classification:   Interdisciplinary ; Code:   18596984 ; Last Updated:   07/02/2016 6:20 EDT ; Life Cycle Date:   07/02/2016 ; Life Cycle Status:   Active ; Vocabulary:   SNOMED CT   ; Comments:        07/02/2016 6:20 - SYSTEM,  SYSTEM  Problem added automatically by system based on initiation of Impaired Tissue Integrity Plan of Care        Diagnoses(Active)    Ankle injury - Minor  Date:   07/01/2016 ; Diagnosis Type:   Reason For Visit ; Confirmation:   Complaint of ; Clinical Dx:   Ankle injury - Minor ; Classification:   Medical ; Clinical Service:   Emergency medicine ; Code:   PNED ; Probability:   0 ; Diagnosis Code:   [id number]      Closed displaced bimalleolar fracture of right ankle  Date:   07/02/2016 ; Diagnosis Type:   Discharge ; Confirmation:   Confirmed ; Clinical Dx:   Closed displaced bimalleolar fracture of right ankle ; Classification:   Medical ; Clinical Service:   Non-Specified ; Code:   ICD-10-CM ; Probability:   0 ; Diagnosis Code:   D17.158J      Fall  Date:   07/01/2016 ; Diagnosis Type:   Discharge ; Confirmation:   Confirmed ; Clinical Dx:   Fall ; Classification:   Medical ; Clinical Service:   Non-Specified ; Code:   ICD-10-CM ; Probability:   0 ; Diagnosis Code:   T80.XXXA      Other abnormalities of gait and mobility  Date:   07/02/2016 ; Diagnosis Type:   Other ; Confirmation:   Differential ; Clinical Dx:   Other abnormalities of gait and mobility ; Classification:   Interdisciplinary ;  Clinical Service:   Non-Specified ; Code:   ICD-10-CM ; Probability:   0 ; Diagnosis  Code:   R26.89        Pain Assessment   Pain Location :   Ankle   Laterality :   Right   Self Report Pain :   Numeric rating scale   Numeric Pain Scale :   6   Numeric Pain Score :   6    STORHOLT, PT, CASSIE W - 07/02/2016 13:14 EDT   Home Environment   Living Environment :   Home Environment  *ADL:  Independent  Performed By:  HOLLICE, OT, STEPHEN 07/02/2016  *Cognitive-Communication Skills:  Independent  Performed By:  HOLLICE, OT, STEPHEN 07/02/2016  *Instrumental ADL:  Independent  Performed By:  HOLLICE, OT, STEPHEN 07/02/2016     Living Situation :   Home independently   Lives With :   Alone   STORHOLT, PT, CASSIE W - 07/02/2016 13:14 EDT   Stairs     Outside Stairs          Number of Stairs :    15                 STORHOLT, PT, CASSIE W - 07/02/2016 13:14 EDT         Home Environment II   Living Environment :   Home Environment  *ADL:  Independent  Performed By:  HOLLICE, OT, STEPHEN 07/02/2016  *Cognitive-Communication Skills:  Independent  Performed By:  HOLLICE, OT, STEPHEN 07/02/2016  *Instrumental ADL:  Independent  Performed By:  HOLLICE GILLIE SENIOR 07/02/2016     STORHOLT, PT, CASSIE W - 07/02/2016 13:14 EDT   Prior Functional Status   ADL :   Independent   Mobility :   Independent   Instrumental ADL :   Independent   Cognitive-Communication Skills :   Independent   STORHOLT, PT, CASSIE W - 07/02/2016 13:14 EDT   LE Range/Strength   LE Overall Range of Motion Grid   Left Lower Extremity Active Range :   Within functional limits   Right Lower Extremity Active Range :   limitations 2* pain& surgery yesterday, distal R LE in splint   STORHOLT, PT, CASSIE W - 07/02/2016 13:14 EDT   Lt Lower Extremity Strength :   Within functional limits   Rt Lower Extremity Strength :   Other: limitations 2* pain & surgery yesterday, not formally MMT   STORHOLT, PT, CASSIE W - 07/02/2016 13:14 EDT   Mobility   Mobility Grid   Supine to Sit :   Rehab  Contact guard assistance   Transfer Sit to Stand :   Rehab Minimal assistance   Transfer Stand to Sit :   Rehab Moderate assistance   (Comment: uncontrolled descent s therapist A, pt sat before close enough despite VCs for safe sequencing 2* pain & anxiety [STORHOLT, PT, CASSIE W - 07/02/2016 13:14 EDT] )   Transfer Bed to and From Chair :   Rehab Moderate assistance   STORHOLT, PT, CASSIE W - 07/02/2016 13:14 EDT   Ambulation Level :   Moderate assistance   Ambulation Quality :   pt able to keep R LE off floor, unable to completely lift  L LE off floor during swing phase/ shuffles, VCs for safety & sequencing. min - up to mod A for balance throughout   Ambulation Distance :   4 ft   Device :   Rolling walker   STORHOLT, PT, CASSIE W - 07/02/2016 13:14 EDT   Balance   Balance Tests Performed :   Other: good seated  balance, fair standing balacne c RW   STORHOLT, PT, CASSIE W - 07/02/2016 13:14 EDT   Assessment   PT Impairments or Limitations :   Ambulation deficits, Balance deficits, Endurance deficits, Pain limiting function, Range of motion deficits, Strength deficits, Transfer deficits   Discharge Recommendations :   eval 8/24 - inpatinet rehab stay     PT Treatment Recommendations :   Pt presents to acute PT 2* fall requiring R ankle ORIF c deficits in functional mobility, requiring up to mod A for bed to chair transfers. Unable to assess gait further than bed to chair on PT eval 2* pain & anxiety. Pt would benefit from acute PT while inpatinet & continued PT in post-acute setting to maximize functional independence / safety & return to high PLOF.      STORHOLT, PT, CASSIE W - 07/02/2016 13:14 EDT   Education   Responsible Learner Present for Session :   Yes   Teaching Method :   Demonstration, Explanation   STORHOLT, PT, CASSIE W - 07/02/2016 13:14 EDT   Physical Therapy Education Grid   Ambulation with Carlis Finder :   Needs further teaching, Needs practice/supervision   Bed Mobility :   Demonstrates   Bed to Chair  Transfers :   Needs further teaching, Needs practice/supervision   Physical Therapy Plan of Care :   Verbalizes understanding   Stairs :   Needs further teaching, Needs practice/supervision   STORHOLT, PT, CASSIE W - 07/02/2016 13:14 EDT   Long Term Goals   Bed, Chair, Wheelchair Goal   Bed, Chair, Wheelchair Goal :   Supervision or setup   (Comment: to be met in 8 days [STORHOLT, PT, CASSIE W - 07/02/2016 13:14 EDT] )   Toilet Transfer Goal :   Supervision or setup   Ambulation Level Surfaces Goal :   Supervision or setup   (Comment: 75 ft c RW [STORHOLT, PT, CASSIE W - 07/02/2016 13:14 EDT] )   Stairs Ambulation :   Minimal contact assistance   (Comment: 8 steps [STORHOLT, PT, CASSIE W - 07/02/2016 13:14 EDT] )   STORHOLT, PT, CASSIE W - 07/02/2016 13:14 EDT   PT LT Goals Reviewed :   Yes   STORHOLT, PT, CASSIE W - 07/02/2016 13:14 EDT   Short Term Goals   Other PT Goals Grid     Goal #1  Goal #2  Goal #3      Goal :    1. Pt no > min A amb 30 ft c RW   2. Pt CGA bed to chair transfers c RW   3. Pt ind c HEP        Status :    Initial   Initial   Initial        Comments :    to be met in 4 days                STORHOLT, PT, CASSIE W - 07/02/2016 13:14 EDT  STORHOLT, PT, CASSIE W - 07/02/2016 13:14 EDT  STORHOLT, PT, CASSIE W - 07/02/2016 13:14 EDT       PT ST Goals Reviewed :   Yes   STORHOLT, PT, CASSIE W - 07/02/2016 13:14 EDT   Plan   PT Frequency Acute :   Daily   Duration :   30    PT Duration Unit Rehab :   Days   Treatments Planned :   Balance training, Basic activities of daily living, Bed  mobility training, Functional training, Patient education, Stair training, Therapeutic activities, Transfer training   Treatment Plan/Goals Established With Patient/Caregiver :   Yes   Other PT Treatment Provided :   daily for acute PT as able   Evaluation Complete :   Yes   STORHOLT, PT, CASSIE W - 07/26/2016 13:14 EDT   Time Spent With Patient   PT Evaluation Units, Moderate Complexity :   1 Unit   PT Individual Eval Time,  Moderate Complexity :   15 minutes   PT Therapeutic Activity Units :   1 units   PT Therapeutic Activity Time :   10 minutes   PT Total Individual Min :   15    PT Treatment Time Comment :   PT eval, sup to sit to stand, amb 4 ft c RW to chair, reviewed HEP (UE punches & shld flex, antiembolisms), education re: d/c planning & safety c transfers, left in chiar c needs in reach , nsg updated   PT Total Timed Code Treatment Units :   1 units   PT Total Timed Code Min :   10    PT Total Untimed Min Ac/Outp :   15    PT Total Treatment Time Ac/Outp :   25    STORHOLT, PT, CASSIE W - 07-26-2016 13:14 EDT   PT G-Codes and Modifiers   Primary Functional Limitation Current Status (ref) :   Mobility   Mobility Current Status [phone]) :   CL At least 60% but less than 80% impaired   Current Status Selection Method :   Used clinical judgment   Primary Functional Limitation Goal Status (ref) :   Mobility   Mobility Goal Status (G-8979) :   CJ At least 20% but less than 40% impaired   Goal Status Selection Method :   Used clinical judgment   STORHOLT, PT, CASSIE W - July 26, 2016 13:14 EDT

## 2016-07-02 NOTE — Nursing Note (Signed)
Medication Administration Follow Up-Text       Medication Administration Follow Up Entered On:  07/02/2016 17:13 EDT    Performed On:  07/02/2016 15:07 EDT by Matthias Hughs, RN, ALEXANDRA      Intervention Information:     acetaminophen-oxycodone  Performed by Matthias Hughs, RN, ALEXANDRA on 07/02/2016 14:07:00 EDT       oxyCODONE-acetaminophen,1tabs  Oral,moderate pain (4-7)       Medication Effectiveness Evaluation   Medication Administration Reason :   Pain   Medication Effective :   Yes   Medication Response :   Symptoms improved   D'ANGELO, RN, ALEXANDRA - 07/02/2016 17:13 EDT

## 2016-07-02 NOTE — Nursing Note (Signed)
Medication Administration Follow Up-Text       Medication Administration Follow Up Entered On:  07/02/2016 12:42 EDT    Performed On:  07/02/2016 9:12 EDT by Matthias Hughs, RN, ALEXANDRA      Intervention Information:     acetaminophen-oxycodone  Performed by Matthias Hughs, RN, ALEXANDRA on 07/02/2016 08:12:00 EDT       oxyCODONE-acetaminophen,1tabs  Oral,moderate pain (4-7)       Medication Effectiveness Evaluation   Medication Administration Reason :   Pain   Medication Effective :   Yes   Medication Response :   Symptoms improved   D'ANGELO, RN, ALEXANDRA - 07/02/2016 12:41 EDT

## 2016-07-02 NOTE — Progress Notes (Signed)
Outpatient OT Certification Letter-Text       Outpatient OT Certification Letter Entered On:  07/02/2016 13:12 EDT    Performed On:  07/02/2016 13:10 EDT by Crisoforo Oxford OT, STEPHEN               Physician Certification   Date of Injury or Surgery :   07/01/2016 EDT   Date of Evaluation :   07/02/2016 EDT   Ordering Physician Name :   Junious Silk   Number of Visits This Interval :   1   Dear Physician :   Thank you for your referral. Below is the patient information for the stated interval of treatment. Please review, modify (if necessary), sign, and return. Thank you.   OT Certification Interval Start :   07/02/2016 EDT   OT Certification Interval End :   08/02/2016 EDT   Physician Signature Required :   Yes   Staff Physician Signature :   The physician's electronic signature noted above indicates approval of the documented Plan of Care for the stated interval.   AMMONS, OT, STEPHEN - 07/02/2016 13:10 EDT   Problem List   (As Of: 07/02/2016 13:12:03 EDT)   Problems(Active)    Alteration in comfort: pain (SNOMED CT  :16109604 )  Name of Problem:   Alteration in comfort: pain ; Recorder:   SYSTEM,  SYSTEM; Confirmation:   Confirmed ; Classification:   Interdisciplinary ; Code:   54098119 ; Last Updated:   07/02/2016 6:20 EDT ; Life Cycle Date:   07/02/2016 ; Life Cycle Status:   Active ; Vocabulary:   SNOMED CT   ; Comments:        07/02/2016 6:20 - SYSTEM,  SYSTEM  Problem added automatically by system based on initiation of Alteration in Comfort Plan of Care      Anemia (SNOMED CT  :ACSYtQDsdrRUkXtoqf79/w )  Name of Problem:   Anemia ; Recorder:   CANADY, RN, BROOKE N; Confirmation:   Confirmed ; Classification:   Medical ; Code:   ACSYtQDsdrRUkXtoqf79/w ; Contributor System:   Dietitian ; Last Updated:   05/19/2016 7:01 EDT ; Life Cycle Date:   05/19/2016 ; Life Cycle Status:   Active ; Vocabulary:   SNOMED CT        Gastric bypass (SNOMED CT  :1478295621 )  Name of Problem:   Gastric bypass ; Recorder:   Ronny Flurry; Confirmation:   Confirmed ; Classification:   Medical ; Code:   3086578469 ; Contributor System:   Dietitian ; Last Updated:   05/19/2016 7:07 EDT ; Life Cycle Date:   05/19/2016 ; Life Cycle Status:   Active ; Vocabulary:   SNOMED CT   ; Comments:        05/19/2016 7:07 - Ronny Flurry  1997      Impaired tissue integrity (SNOMED CT  :62952841 )  Name of Problem:   Impaired tissue integrity ; Recorder:   SYSTEM,  SYSTEM; Confirmation:   Confirmed ; Classification:   Interdisciplinary ; Code:   32440102 ; Last Updated:   07/02/2016 6:20 EDT ; Life Cycle Date:   07/02/2016 ; Life Cycle Status:   Active ; Vocabulary:   SNOMED CT   ; Comments:        07/02/2016 6:20 - SYSTEM,  SYSTEM  Problem added automatically by system based on initiation of Impaired Tissue Integrity Plan of Care        Diagnoses(Active)    Ankle injury - Minor  Date:   07/01/2016 ; Diagnosis Type:   Reason For Visit ; Confirmation:   Complaint of ; Clinical Dx:   Ankle injury - Minor ; Classification:   Medical ; Clinical Service:   Emergency medicine ; Code:   PNED ; Probability:   0 ; Diagnosis Code:   [id number]      Closed displaced bimalleolar fracture of right ankle  Date:   07/02/2016 ; Diagnosis Type:   Discharge ; Confirmation:   Confirmed ; Clinical Dx:   Closed displaced bimalleolar fracture of right ankle ; Classification:   Medical ; Clinical Service:   Non-Specified ; Code:   ICD-10-CM ; Probability:   0 ; Diagnosis Code:   Z61.096E      Fall  Date:   07/01/2016 ; Diagnosis Type:   Discharge ; Confirmation:   Confirmed ; Clinical Dx:   Fall ; Classification:   Medical ; Clinical Service:   Non-Specified ; Code:   ICD-10-CM ; Probability:   0 ; Diagnosis Code:   A54.XXXA      Other abnormalities of gait and mobility  Date:   07/02/2016 ; Diagnosis Type:   Other ; Confirmation:   Differential ; Clinical Dx:   Other abnormalities of gait and mobility ; Classification:   Interdisciplinary ; Clinical  Service:   Non-Specified ; Code:   ICD-10-CM ; Probability:   0 ; Diagnosis Code:   R26.89        Plan   OT Evaluation Date :   07/02/2016 EDT   OT Frequency Rehab :   Mo/Tu/We/Th/Fr   OT Duration Unit Rehab :   Days   OT Duration Rehab :   15   OT Anticipated Treatments :   Activity of daily living training, Balance training, Edema management, Mobility training, Pain management, Safety education, Therapeutic exercises   OT Certification Letter Complete :   Yes   AMMONS, OT, STEPHEN - 07/02/2016 13:10 EDT   Outpatient Review   Rehab Potential Occupational Therapy :   Good   AMMONS, OT, STEPHEN - 07/02/2016 13:10 EDT   Prior Functional Level Grid   ADL :   Independent   Bed Mobility :   Independent   Instrumental ADL :   Independent   Cognitive-Communication Skills :   Independent   AMMONS, OT, STEPHEN - 07/02/2016 13:10 EDT   OT Impairments or Limitations :   Balance deficits, Basic activity of daily living deficits, Endurance deficits, Equipment training, Mobility deficits, Pain, Strength deficits, Other: NWB status R LE   OT Clinical Assessment Summary :   Pt may benefit from acute rehab upon d/c.     AMMONS, OT, STEPHEN - 07/02/2016 13:10 EDT    Signature Line                                                 Electronically Signed On 07/02/16 01:10 PM                                               _________________________________________________  AMMONS, OT, STEPHEN                                                     Electronically Signed On 07/07/16 10:46 AM                                               _________________________________________________                                                Junious Silk                  Dicatation Date: 07/02/16 01:10 PM

## 2016-07-02 NOTE — Progress Notes (Signed)
Inpatient OT Evaluation - Text       Inpatient OT Evaluation Entered On:  07/02/2016 13:09 EDT    Performed On:  07/02/2016 12:59 EDT by AMMONS, OT, STEPHEN               Reason for Treatment   Subjective Statement :   Evaluation complete     *Reason for Referral :   s/p R ankle fx     *Chief Complaint :   R LE pain     AMMONS, OT, STEPHEN - 07/02/2016 12:59 EDT   General Information   Occupational Therapy Orders :   Occupational Therapy Evaluation and Treatment Inpatient Acute - 07/02/16 11:26:00 EDT, Soon, Stop date 07/02/16 11:26:00 EDT     Precautions RTF :   No qualifying data available.     Pain Present :   Yes actual or suspected pain   Orientation Assessment :   Oriented x 4   Affect/Behavior :   Appropriate   AMMONS, OT, STEPHEN - 07/02/2016 12:59 EDT   Problem List   (As Of: 07/02/2016 13:09:58 EDT)   Problems(Active)    Alteration in comfort: pain (SNOMED CT  :16109604 )  Name of Problem:   Alteration in comfort: pain ; Recorder:   SYSTEM,  SYSTEM; Confirmation:   Confirmed ; Classification:   Interdisciplinary ; Code:   [id number] ; Last Updated:   07/02/2016 6:20 EDT ; Life Cycle Date:   07/02/2016 ; Life Cycle Status:   Active ; Vocabulary:   SNOMED CT   ; Comments:        07/02/2016 6:20 - SYSTEM,  SYSTEM  Problem added automatically by system based on initiation of Alteration in Comfort Plan of Care      Anemia (SNOMED CT  :ACSYtQDsdrRUkXtoqf79/w )  Name of Problem:   Anemia ; Recorder:   CANADY, RN, BROOKE N; Confirmation:   Confirmed ; Classification:   Medical ; Code:   ACSYtQDsdrRUkXtoqf79/w ; Contributor System:   Dietitian ; Last Updated:   05/19/2016 7:01 EDT ; Life Cycle Date:   05/19/2016 ; Life Cycle Status:   Active ; Vocabulary:   SNOMED CT        Gastric bypass (SNOMED CT  :1478295621 )  Name of Problem:   Gastric bypass ; Recorder:   Ronny Flurry; Confirmation:   Confirmed ; Classification:   Medical ; Code:   [id number] ; Contributor System:   Dietitian ; Last Updated:   05/19/2016 7:07  EDT ; Life Cycle Date:   05/19/2016 ; Life Cycle Status:   Active ; Vocabulary:   SNOMED CT   ; Comments:        05/19/2016 7:07 - Ronny Flurry  1997      Impaired tissue integrity (SNOMED CT  :62952841 )  Name of Problem:   Impaired tissue integrity ; Recorder:   SYSTEM,  SYSTEM; Confirmation:   Confirmed ; Classification:   Interdisciplinary ; Code:   32440102 ; Last Updated:   07/02/2016 6:20 EDT ; Life Cycle Date:   07/02/2016 ; Life Cycle Status:   Active ; Vocabulary:   SNOMED CT   ; Comments:        07/02/2016 6:20 - SYSTEM,  SYSTEM  Problem added automatically by system based on initiation of Impaired Tissue Integrity Plan of Care        Diagnoses(Active)    Ankle injury - Minor  Date:   07/01/2016 ; Diagnosis Type:   Reason For Visit ;  Confirmation:   Complaint of ; Clinical Dx:   Ankle injury - Minor ; Classification:   Medical ; Clinical Service:   Emergency medicine ; Code:   PNED ; Probability:   0 ; Diagnosis Code:   [id number]      Closed displaced bimalleolar fracture of right ankle  Date:   07/02/2016 ; Diagnosis Type:   Discharge ; Confirmation:   Confirmed ; Clinical Dx:   Closed displaced bimalleolar fracture of right ankle ; Classification:   Medical ; Clinical Service:   Non-Specified ; Code:   ICD-10-CM ; Probability:   0 ; Diagnosis Code:   [id number]      Fall  Date:   07/01/2016 ; Diagnosis Type:   Discharge ; Confirmation:   Confirmed ; Clinical Dx:   Fall ; Classification:   Medical ; Clinical Service:   Non-Specified ; Code:   ICD-10-CM ; Probability:   0 ; Diagnosis Code:   [id number]      Other abnormalities of gait and mobility  Date:   07/02/2016 ; Diagnosis Type:   Other ; Confirmation:   Differential ; Clinical Dx:   Other abnormalities of gait and mobility ; Classification:   Interdisciplinary ; Clinical Service:   Non-Specified ; Code:   ICD-10-CM ; Probability:   0 ; Diagnosis Code:   R26.89        Pain Assessment   Pain Location :   Ankle   Laterality :    Right   Quality :   Aching   Self Report Pain :   Numeric rating scale   Numeric Pain Scale :   6   Numeric Pain Score :   6    AMMONS, OT, STEPHEN - 07/02/2016 12:59 EDT   OT Basic ADL   Basic ADL Grid   Eating :   Supervision or setup   Grooming :   Supervision or setup   Bathing :   Minimal contact assistance   UE Dressing :   Supervision or setup   LE Dressing :   Minimal contact assistance   AMMONS, OT, STEPHEN - 07/02/2016 12:59 EDT   ADL Comments :   supine>sit sba;  sit>stand min assist    NWB R LE     Limiting Factors :   Pain, Other: NWB status R LE   AMMONS, OT, STEPHEN - 07/02/2016 12:59 EDT   UE Strength/ROM   Upper Extremity Overall ROM Grid   Left Upper Extremity Passive Range :   Within functional limits   Left Upper Extremity Active Range :   Within functional limits   Right Upper Extremity Passive Range :   Within functional limits   Right Upper Extremity Active Range :   Within functional limits   AMMONS, OT, STEPHEN - 07/02/2016 12:59 EDT   Lt Upper Extremity Strength :   Within functional limits   AMMONS, OT, STEPHEN - 07/02/2016 12:59 EDT   Left Upper Extremity Strength Grid   Scapular Elevation :   5   Shoulder Flexion :   5   Shoulder Extension :   5   Shoulder Abduction :   5   Shoulder Adduction :   5   Shoulder External Rotation :   5   Shoulder Internal Rotation :   5   Elbow Flexion :   5   Elbow Extension :   5   Forearm Pronation :   5   Forearm Supination :   5  Wrist Flexion :   5   Wrist Extension :   5   Finger Flexion :   5   Finger Extension :   5   AMMONS, OT, STEPHEN - 07/02/2016 12:59 EDT   Rt Upper Extremity Strength :   Within functional limits   AMMONS, OT, STEPHEN - 07/02/2016 12:59 EDT   Right Upper Extremity Strength Grid   Scapular Elevation :   5   Shoulder Flexion :   5   Shoulder Extension :   5   Shoulder Abduction :   5   Shoulder Adduction :   5   Shoulder External Rotation :   5   Shoulder Internal Rotation :   5   Elbow Flexion :   5   Elbow Extension :   5   Forearm  Pronation :   5   Forearm Supination :   5   Wrist Flexion :   5   Wrist Extension :   5   Finger Flexion :   5   Finger Extension :   5   AMMONS, OT, STEPHEN - 07/02/2016 12:59 EDT   Long Term Goals   Outpatient PT Long Term Goals Rehab     Long Term Goal 1  Long Term Goal 2  Long Term Goal 3      Goal :    Mod Indep with LE ADLs   Mod Indep with toilet t/f   Maximize B UE strength/edurance          AMMONS, OT, STEPHEN - 07/02/2016 12:59 EDT  AMMONS, OT, STEPHEN - 07/02/2016 12:59 EDT  AMMONS, OT, STEPHEN - 07/02/2016 12:59 EDT       OT LT Goals Reviewed :   Yes   AMMONS, OT, STEPHEN - 07/02/2016 12:59 EDT   Short Term Goals   Eating Goal Grid     Goal #1          Assist :    Setup                AMMONS, OT, STEPHEN - 07/02/2016 12:59 EDT         Grooming Goal Grid     Goal #1          Assist :    Setup                AMMONS, OT, STEPHEN - 07/02/2016 12:59 EDT         Upper Body Dressing Short Term Goal Grid     Goal #1          Assist :    Setup                AMMONS, OT, STEPHEN - 07/02/2016 12:59 EDT         Lower Body Dressing Grid     Goal #1          Assist :    Contact guard assistance                AMMONS, OT, STEPHEN - 07/02/2016 12:59 EDT         Bathing Goal Grid     Goal #1          Assist :    Contact guard assistance                AMMONS, OT, STEPHEN - 07/02/2016 12:59 EDT         Toileting  and Transfers Goal Grid     Goal #1          Activity :    Toilet transfers              Assist :    Contact guard assistance                AMMONS, OT, STEPHEN - 07-14-16 12:59 EDT         OT ST Goals Reviewed :   Yes   AMMONS, OT, STEPHEN - July 14, 2016 12:59 EDT   Plan   OT Evaluation Date :   07-14-16 EDT   OT Frequency Acute :   Mo/Tu/We/Th/Fr   Duration :   15    Duration Unit :   Days   Estimated Hours Per Day :   Other: 15 mins per day   Planned Treatments :   Balance training, Basic Activities of Daily Living, Equipment training, Mobility training, Pain management, Patient education, Safety education, Therapeutic  activities, Therapeutic exercises, Therapeutic exercises for strengthening and ROM   Treatment Plan/Goals Established With Patient/Caregiver :   Yes   OT Evaluation Complete :   Yes   AMMONS, OT, STEPHEN - 07/14/2016 12:59 EDT   Time Spent With Patient   OT Evaluation Units, Low Complexity :   1 Unit   OT Time In :   11:45 EST   OT Time Out :   11:55 EST   OT Individual Eval Time, Low Complexity :   10 minutes   OT Total Individual Therapy Time :   10 minutes   OT Total Untimed Code Treatment Minutes :   10 minutes   OT Total Treatment Time Rehab :   10 minutes   AMMONS, OT, STEPHEN - 07-14-2016 12:59 EDT   Assessment   OT Impairments or Limitations :   Balance deficits, Basic activity of daily living deficits, Endurance deficits, Equipment training, Mobility deficits, Pain, Strength deficits, Other: NWB status R LE   Barriers to Safe Discharge OT :   Limited family support, Limited social support, Severity of deficits   OT Discharge Recommendations :   Pt may benefit from acute rehab upon d/c.     OT Treatment Recommendations :   Pt limited by pain and NWB status R LE.  Pt has 15 STE home.     AMMONS, OT, STEPHEN - 07/14/16 12:59 EDT   OT G-Codes and Modifiers   G-Code Required :   Yes   Primary Functional Limitation Reported :   Self-Care   Reason for G-Code :   Initial assessment   Status Selection Method :   Used clinical judgment   Self-Care Current Status (E-4540) :   CJ At least 20% but less than 40% impaired   Self-Care Goal Status (J-8119) :   CI At least 1% but less than 20% impaired   OT Required Sections - Self Care :   Current Status and Goal Status   AMMONS, OT, STEPHEN - 07/14/2016 12:59 EDT

## 2016-07-03 NOTE — Case Communication (Signed)
CM Discharge Planning Assessment - Text       CM Discharge Planning Assessment Entered On:  07/03/2016 12:36 EDT    Performed On:  07/03/2016 12:33 EDT by Lee,  [place] :   Living Situation: Home independently  Current Home Treatments:   Home Devices/Equipment   Professional Skilled Services:   Special Services and Community Resources:   Sensory Deficits:   Performed by: Dedra Skeens, PT, CASSIE W-07/02/16 13:14:00       Affect/Behavior :   Appropriate   Lives With :   Alone   Lives In :   Single level home   Anita Vazquez - 07/03/2016 12:33 EDT   [place]     Outside Stairs          Number of Stairs :    [place] - 07/03/2016 12:33 EDT         Living Situation :   Home independently   Job Responsibilities :   works at [employer] at Home :   External stairs   Anita Vazquez - 07/03/2016 12:33 EDT   Home Environment   ADLs :   Minimal assist   Anita Vazquez - 07/03/2016 12:33 EDT   Discharge Needs I   Previously Documented Discharge Needs :   DISCHARGE PLAN/NEEDS:  Discharge To, Anticipated: Home independently - MOODY, RN, JEAN N - 07/01/16 14:32:00  EQUIPMENT/TREATMENT NEEDS:       Previously Documented Benefits Information :   No discharge data available.     Anticipated Discharge Date :   07/03/2016 EDT   Anticipated Discharge Time Slot :   1400-1600   Discharge To :   Acute Rehab   CM Progress Note :   07/03/16 jml: Per Cassie PT yesterday, pt lives alone and will need acute rehab. She lives on [address] and would prefer [facility]. I contacted [facility] and requested an eval. Precert was started yesterday and approved today. I met w/pt; she is anxious to d/c to rehab today after she speaks w/Dr. Tamera Stands. [facility] arranged for 2 PM. She has notified her family.     Anita Vazquez - 07/03/2016 12:33 EDT   Discharge Needs II   Professional Skilled Services :   No Needs   Needs Assistance with Transportation :    Yes   Needs Assistance at Home Upon Discharge :   Yes   Discharge Planning Time Spent :   25 minutes   Anita Vazquez - 07/03/2016 12:33 EDT   Benefits   Insurance Information :   [place]   Anita Vazquez - 07/03/2016 12:33 EDT   Advance Directive   *Advance Directive :   No   Patient Wishes to Receive Further Information on Advance Directives :   No   Anita Vazquez - 07/03/2016 12:33 EDT

## 2016-07-03 NOTE — Nursing Note (Signed)
Basic Admission Information - Text       Basic Admission Information Adult Entered On:  07/03/2016 18:53 EDT    Performed On:  07/03/2016 18:52 EDT by Georgian CoWILLIAMS,  LAUREN F               Vital Signs   Temperature Oral :   36.6 degC   Peripheral Pulse Rate :   71 bpm   Respiratory Rate :   19 br/min   Systolic/ Diastolic BP :   137 mmHg   Diastolic Blood Pressure :   72 mmHg   SpO2 :   96 %   O2 Therapy :   Room air   Michaelle BirksWILLIAMS,  LAUREN F - 07/03/2016 18:52 EDT   Height/Weight   Weight Dosing :   84.3 kg(Converted to: 185 lb 14 oz, 185.850 lb)    Weight Measured :   84.3 kg(Converted to: 185 lb 14 oz, 185.850 lb)    Height/Length Measured :   160 cm(Converted to: 5 ft 3 in, 5.25 ft, 62.99 in)    BSA Measured :   1.87 m2   Body Mass Index Measured :   32.93 kg/m2   Ideal Body Weight Calculated :   52.382 kg   Michaelle BirksWILLIAMS,  LAUREN F - 07/03/2016 18:52 EDT   Valuables/Belongings   Patient Search Completed :   Not done   Michaelle BirksWILLIAMS,  LAUREN F - 07/03/2016 18:52 EDT   Clothing Valuables Belongings Grid     Clothes, Patient Valuables          At Bedside :    1  (Comment: clothes jewlery and phone and glasses    [employer][WILLIAMS,  LAUREN F - 07/03/2016 18:52 EDT] )               Georgian CoWILLIAMS,  LAUREN F - 07/03/2016 18:52 EDT

## 2016-07-03 NOTE — Progress Notes (Signed)
Preadmission Screen Chart Review - Text       Preadmission Screening Chart Review Entered On:  07/03/2016 11:05 EDT    Performed On:  07/03/2016 10:39 EDT by Anita Vazquez               Data History   Type of Evaluation :   Medical record review onsite   Date of Onset :   07/01/2016 EDT   Referring Physician :   Matthew Folks   Review  of Present Illness :   59yoF admitted to hospital for a right ankle fx. She had displaced bimalleolar ankle fx . She denies any numbness or tingling of the right  lower extremity. She have a h/o anemia. gastric bypass     Anita Vazquez,  Anita Vazquez - 07/03/2016 10:39 EDT   Med History   Medication List   (As Of: 07/03/2016 11:05:57 EDT)   Normal Order    Sodium Chloride 0.9% intravenous solution 1,000 mL  :   Sodium Chloride 0.9% intravenous solution 1,000 mL ; Status:   Ordered ; Ordered As Mnemonic:   Sodium Chloride 0.9% 1,000 mL ; Simple Display Line:   75 mL/hr, IV ; Ordering Provider:   Matthew Folks; Catalog Code:   Sodium Chloride 0.9% ; Order Dt/Tm:   07/02/2016 02:22:26          Lactated Ringers Injection solution 1,000 mL  :   Lactated Ringers Injection solution 1,000 mL ; Status:   Ordered ; Ordered As Mnemonic:   Lactated Ringers Injection 1,000 mL ; Simple Display Line:   40 mL/hr, IV ; Ordering Provider:   Matthew Folks; Catalog Code:   Lactated Ringers Injection ; Order Dt/Tm:   07/01/2016 14:38:55 ; Comment:   Perioperative use ONLY  For Non Dialysis Patient          Sodium Chloride 0.9%  solution 500 mL  :   Sodium Chloride 0.9%  solution 500 mL ; Status:   Ordered ; Ordered As Mnemonic:   Sodium Chloride 0.9% 500 mL ; Simple Display Line:   10 mL/hr, IV ; Ordering Provider:   Matthew Folks; Catalog Code:   Sodium Chloride 0.9% ; Order Dt/Tm:   07/01/2016 14:38:55 ; Comment:   Perioperative use ONLY  For Dialysis Patient          ondansetron 2 mg/mL Inj Soln 2 mL  :   ondansetron 2 mg/mL Inj Soln 2 mL ; Status:   Ordered ; Ordered As  Mnemonic:   Zofran ; Simple Display Line:   4 mg, 2 mL, IV Push, q6hr, PRN: nausea/vomiting ; Ordering Provider:   Matthew Folks; Catalog Code:   ondansetron ; Order Dt/Tm:   07/03/2016 04:36:01          acetaminophen-oxyCODONE 325 mg-5 mg Tab  :   acetaminophen-oxyCODONE 325 mg-5 mg Tab ; Status:   Ordered ; Ordered As Mnemonic:   Percocet 5/325 oral tablet range dose ; Simple Display Line:   see comments, Oral, q4hr, PRN: moderate pain (4-7) ; Ordering Provider:   Matthew Folks; Catalog Code:   acetaminophen-oxyCODONE ; Order Dt/Tm:   07/02/2016 18:22:43 ; Comment:   MAX DAILY DOSE OF ACETAMINOPHEN = 3000 MG          acetaminophen-oxyCODONE 325 mg-7.5 mg Tab  :   acetaminophen-oxyCODONE 325 mg-7.5 mg Tab ; Status:   Discontinued ; Ordered As Mnemonic:   Percocet 7.5/325 oral tablet range dose ; Simple  Display Line:   see comments, Oral, q4hr, PRN: moderate pain (4-7) ; Ordering Provider:   Matthew Folks; Catalog Code:   acetaminophen-oxyCODONE ; Order Dt/Tm:   07/02/2016 18:21:10 ; Comment:   MAX DAILY DOSE OF ACETAMINOPHEN = 3000 MG          A Patient Specific Medication  :   A Patient Specific Medication ; Status:   Ordered ; Ordered As Mnemonic:   A Patient Specific Medication ; Simple Display Line:   1 EA, Kit-Combo, q60mn, PRN: other (see comment) ; Ordering Provider:   WMatthew Folks Catalog Code:   A Patient Specific Medication ; Order Dt/Tm:   07/02/2016 13:25:43 ; Comment:   to access the patient specific medication drawer          A Patient Specific Refrigerated Medication  :   A Patient Specific Refrigerated Medication ; Status:   Ordered ; Ordered As Mnemonic:   A Patient Specific Refrigerated Medication ; Simple Display Line:   1 EA, Kit-Combo, q583m, PRN: other (see comment) ; Ordering Provider:   WIMatthew FolksCatalog Code:   A Patient Specific Refrigerated Medicati ; Order Dt/Tm:   07/02/2016 13:25:43 ; Comment:   to access the patient specific Refrigerated  medications          lidocaine 1% PF Inj Soln 2 mL  :   lidocaine 1% PF Inj Soln 2 mL ; Status:   Ordered ; Ordered As Mnemonic:   lidocaine 1% preservative-free injectable solution ; Simple Display Line:   0.5 mL, ID, q5m98m PRN: other (see comment) ; Ordering Provider:   WILMatthew Folksatalog Code:   lidocaine ; Order Dt/Tm:   07/02/2016 13:25:44 ; Comment:   to access lidocaine 1%  2 mL vial for IV start and Life Port access          Respiratory MDI Treatment  :   Respiratory MDI Treatment ; Status:   Ordered ; Ordered As Mnemonic:   Respiratory MDI Treatment ; Simple Display Line:   1 EA, Kit-Combo, q5mi72mPRN: other (see comment) ; Ordering Provider:   WILDMatthew Folkstalog Code:   Respiratory MDI Treatment ; Order Dt/Tm:   07/02/2016 13:25:43          sodium chloride 0.9% Inj Soln 10 mL syringe  :   sodium chloride 0.9% Inj Soln 10 mL syringe ; Status:   Ordered ; Ordered As Mnemonic:   sodium chloride 0.9% flush syringe range dose ; Simple Display Line:   30 mL, see comments, IV Push, q5min61mRN: other (see comment) ; Ordering Provider:   WILDSMatthew Folksalog Code:   sodium chloride flush ; Order Dt/Tm:   07/02/2016 13:25:44 ; Comment:   for access to sodium chloride 0.9% syringe for INT flush if needed          sodium chloride 0.9% Inj Soln 10 mL vial  :   sodium chloride 0.9% Inj Soln 10 mL vial ; Status:   Ordered ; Ordered As Mnemonic:   sodium chloride 0.9% vial for reconstitution range dose ; Simple Display Line:   30 mL, see comments, IV Push, q5min,65mN: other (see comment) ; Ordering Provider:   WILDSTMatthew Folkslog Code:   sodium chloride flush ; Order Dt/Tm:   07/02/2016 13:25:44 ; Comment:   for access to sodium chloride 0.9% vial when needed as a diluent for reconstitutable medications          sterile  water Inj Soln 10 mL  :   sterile water Inj Soln 10 mL ; Status:   Ordered ; Ordered As Mnemonic:   sterile water for reconstitution ; Simple Display Line:    10 mL, N/A, q15mn, PRN: other (see comment) ; Ordering Provider:   WMatthew Folks Catalog Code:   sterile water for reconstitution ; Order Dt/Tm:   07/02/2016 13:25:45 ; Comment:   to access sterile water when needed as a diluent for reconstitutable medications. Not for IV use.          acetaminophen-oxyCODONE 325 mg-5 mg Tab  :   acetaminophen-oxyCODONE 325 mg-5 mg Tab ; Status:   Discontinued ; Ordered As Mnemonic:   Percocet 5/325 oral tablet ; Simple Display Line:   1 tabs, Oral, q6hr, PRN: moderate pain (4-7) ; Ordering Provider:   WMatthew Folks Catalog Code:   acetaminophen-oxyCODONE ; Order Dt/Tm:   07/02/2016 07:51:08 ; Comment:   ---  At home, patient was taking medication with the following details:  PRN Instructions: moderate pain (4-7)  Comments: MAX DAILY DOSE OF ACETAMINOPHEN = 3000 MG  ---          clindamycin  :   clindamycin ; Status:   Completed ; Ordered As Mnemonic:   clindamycin IVPB ; Simple Display Line:   900 mg, 50 mL, 100 mL/hr, IV Piggyback, q8hr ; Ordering Provider:   WMatthew Folks Catalog Code:   clindamycin ; Order Dt/Tm:   07/02/2016 064:33:29         acetaminophen 325 mg Tab  :   acetaminophen 325 mg Tab ; Status:   Ordered ; Ordered As Mnemonic:   acetaminophen ; Simple Display Line:   650 mg, 2 tabs, Oral, q4hr, PRN: mild pain (1-3) ; Ordering Provider:   WMatthew Folks Catalog Code:   acetaminophen ; Order Dt/Tm:   07/02/2016 02:22:26 ; Comment:   Max 3 g/24hrs  MAX DAILY DOSE OF ACETAMINOPHEN = 3000 MG          morphine 2 mg/mL preservative-free Sol  :   morphine 2 mg/mL preservative-free Sol ; Status:   Ordered ; Ordered As Mnemonic:   morphine range dose ; Simple Display Line:   2 mg, 1 mL, IV Push, q3hr, PRN: severe pain (8-10) ; Ordering Provider:   WMatthew Folks Catalog Code:   morphine ; Order Dt/Tm:   07/02/2016 02:22:27          ondansetron 2 mg/mL Inj Soln 2 mL  :   ondansetron 2 mg/mL Inj Soln 2 mL ; Status:   Completed ;  Ordered As Mnemonic:   ondansetron ; Simple Display Line:   4 mg, 2 mL, IV Push, Once, PRN: nausea/vomiting ; Ordering Provider:   WMatthew Folks Catalog Code:   ondansetron ; Order Dt/Tm:   07/02/2016 02:22:27          morphine 2 mg/mL preservative-free Sol  :   morphine 2 mg/mL preservative-free Sol ; Status:   Ordered ; Ordered As Mnemonic:   morphine ; Simple Display Line:   2 mg, 1 mL, IV Push, q2hr, PRN: moderate pain (4-7) ; Ordering Provider:   WMatthew Folks Catalog Code:   morphine ; Order Dt/Tm:   07/01/2016 11:13:43            Prescription/Discharge Order    acetaminophen-oxyCODONE  :   acetaminophen-oxyCODONE ; Status:   Prescribed ; Ordered As Mnemonic:   Percocet 5/325 oral tablet ;  Simple Display Line:   1-2 tabs, Oral, q6hr, PRN: moderate pain (4-7), 50 tabs, 0 Refill(s) ; Ordering Provider:   Matthew Folks; Catalog Code:   acetaminophen-oxyCODONE ; Order Dt/Tm:   07/01/2016 19:04:17 ; Comment:   MAX DAILY DOSE OF ACETAMINOPHEN = 3000 MG            Home Meds    acetaminophen-oxyCODONE  :   acetaminophen-oxyCODONE ; Status:   Documented ; Ordered As Mnemonic:   Percocet 5/325 oral tablet ; Simple Display Line:   1 tabs, Oral, q6hr, PRN: moderate pain (4-7), 0 Refill(s) ; Ordering Provider:   Matthew Folks; Catalog Code:   acetaminophen-oxyCODONE ; Order Dt/Tm:   07/02/2016 07:52:39            Problem History   (As Of: 07/03/2016 11:05:57 EDT)   Problems(Active)    Alteration in comfort: pain (SNOMED CT  :40347425 )  Name of Problem:   Alteration in comfort: pain ; Recorder:   SYSTEM,  SYSTEM; Confirmation:   Confirmed ; Classification:   Interdisciplinary ; Code:   [id number] ; Last Updated:   07/02/2016 6:20 EDT ; Life Cycle Date:   07/02/2016 ; Life Cycle Status:   Active ; Vocabulary:   SNOMED CT   ; Comments:        07/02/2016 6:20 - SYSTEM,  SYSTEM  Problem added automatically by system based on initiation of Alteration in Comfort Plan of Care      Anemia (SNOMED  CT  :[id number]/w )  Name of Problem:   Anemia ; Recorder:   CANADY, RN, [place]; Confirmation:   Confirmed ; Classification:   Medical ; Code:   [id number]/w ; Contributor System:   Conservation officer, nature ; Last Updated:   05/19/2016 7:01 EDT ; Life Cycle Date:   05/19/2016 ; Life Cycle Status:   Active ; Vocabulary:   SNOMED CT        Gastric bypass (SNOMED CT  :4332951884 )  Name of Problem:   Gastric bypass ; Recorder:   Augustina Mood; Confirmation:   Confirmed ; Classification:   Medical ; Code:   [id number] ; Contributor System:   Conservation officer, nature ; Last Updated:   05/19/2016 7:07 EDT ; Life Cycle Date:   05/19/2016 ; Life Cycle Status:   Active ; Vocabulary:   SNOMED CT   ; Comments:        05/19/2016 7:07 - Augustina Mood  1997      Impaired tissue integrity (SNOMED CT  :10932355 )  Name of Problem:   Impaired tissue integrity ; Recorder:   SYSTEM,  SYSTEM; Confirmation:   Confirmed ; Classification:   Interdisciplinary ; Code:   [id number] ; Last Updated:   07/02/2016 6:20 EDT ; Life Cycle Date:   07/02/2016 ; Life Cycle Status:   Active ; Vocabulary:   SNOMED CT   ; Comments:        07/02/2016 6:20 - SYSTEM,  SYSTEM  Problem added automatically by system based on initiation of Impaired Tissue Integrity Plan of Care        Allergy   (As Of: 07/03/2016 11:05:57 EDT)   Allergies (Active)   penicillins  Estimated Onset Date:   Unspecified ; Created ByDeri Fuelling, RN, BROOKE N; Reaction Status:   Active ; Category:   Drug ; Substance:   penicillins ; Type:   Allergy ; Severity:   Severe ; Updated By:   Deri Fuelling, RN, Iva Lento; Reviewed Date:  07/01/2016 14:34 EDT        Data   Preadmission Data Results :   Rehab Preadmission Data  History of TB RIPT: No (07/01/16 14:39:21)  Home Diet: Regular (07/01/16 14:39:21)       Weight Dosing :   75.5 kg(Converted to: 166.449 lb, 1,610.960 oz)    Height :   160 cm(Converted to: 5 ft 3 in, 5.25 ft, 62.99 in)    Body Mass Index Dosing :   29 kg/m2   Current IV :   No    Pain Present :   No actual or suspected pain   Isolation Precautions :   Standard   Anita Vazquez,  Anita Vazquez - 07/03/2016 10:39 EDT   Preadm Precautions Grid   Fall :   Yes   Orthopedic :   Yes   Anita Vazquez,  Anita Vazquez - 07/03/2016 10:39 EDT   Behavioral Problems :   None   Dialysis :   None   Diet Type :   Regular   Anita Vazquez,  Anita Vazquez - 07/03/2016 10:39 EDT   Lab Values, Diagnostics, Vital Signs   Lab Results RTF :   No qualifying data available.     Vital Signs Results :   Rehab Vital Signs Grouper  Diastolic Blood Pressure: 67 mmHg (07/03/16 45:40:98)  Diastolic Blood Pressure: 75 mmHg (07/03/16 11:91:47)  Diastolic Blood Pressure: 69 mmHg (07/03/16 82:95:62)  Diastolic Blood Pressure: 68 mmHg (07/02/16 13:08:65)  Diastolic Blood Pressure: 62 mmHg (07/02/16 78:46:96)  Diastolic Blood Pressure: 67 mmHg (07/02/16 29:52:84)  Diastolic Blood Pressure: 74 mmHg (07/02/16 13:24:40)  Diastolic Blood Pressure: 69 mmHg (07/02/16 10:27:25)  Diastolic Blood Pressure: 72 mmHg (07/02/16 36:64:40)  Diastolic Blood Pressure: 77 mmHg (07/01/16 34:74:25)  Diastolic Blood Pressure: 63 mmHg (07/01/16 95:63:87)  Diastolic Blood Pressure: 75 mmHg (07/01/16 56:43:32)  Diastolic Blood Pressure: 72 mmHg (07/01/16 95:18:84)  Diastolic Blood Pressure: 77 mmHg (07/01/16 16:60:63)  Diastolic Blood Pressure: 72 mmHg (07/01/16 01:60:10)  Diastolic Blood Pressure: 85 mmHg (07/01/16 93:23:55)  Diastolic Blood Pressure: 81 mmHg (07/01/16 73:22:02)  Diastolic Blood Pressure: 81 mmHg (07/01/16 54:27:06)  Diastolic Blood Pressure: 96 mmHg High (07/01/16 23:76:28)  Diastolic Blood Pressure: 315 mmHg Critical (07/01/16 17:61:60)  Diastolic Blood Pressure: 90 mmHg (07/01/16 73:71:06)  Diastolic Blood Pressure: 81 mmHg (07/01/16 12:32:14)  Heart Rate Monitored: 70 bpm (07/03/16 09:37:33)  Heart Rate Monitored: 77 bpm (07/02/16 13:42:58)  Heart Rate Monitored: 73 bpm (07/02/16 09:20:21)  Heart Rate Monitored: 74 bpm (07/02/16 02:19:05)  Heart Rate Monitored:  86 bpm (07/01/16 23:13:44)  Heart Rate Monitored: 89 bpm (07/01/16 22:23:37)  Heart Rate Monitored: 89 bpm (07/01/16 20:12:10)  Heart Rate Monitored: 87 bpm (07/01/16 19:56:38)  Heart Rate Monitored: 84 bpm (07/01/16 19:42:47)  Heart Rate Monitored: 89 bpm (07/01/16 19:40:33)  Heart Rate Monitored: 99 bpm (07/01/16 19:36:42)  Heart Rate Monitored: 89 bpm (07/01/16 19:35:13)  Heart Rate Monitored: 101 bpm High (07/01/16 19:35:13)  Heart Rate Monitored: 93 bpm (07/01/16 19:03:28)  Heart Rate Monitored: 104 bpm High (07/01/16 18:59:29)  Heart Rate Monitored: 89 bpm (07/01/16 13:41:24)  Heart Rate Monitored: 92 bpm (07/01/16 12:32:14)  Mean Arterial Pressure, Cuff: 82 mmHg (07/03/16 09:37:33)  Mean Arterial Pressure, Cuff: 90 mmHg (07/03/16 05:49:01)  Mean Arterial Pressure, Cuff: 87 mmHg (07/02/16 21:45:59)  Mean Arterial Pressure, Cuff: 80 mmHg (07/02/16 18:22:16)  Mean Arterial Pressure, Cuff: 83 mmHg (07/02/16 13:42:58)  Mean Arterial Pressure, Cuff: 88 mmHg (07/02/16 09:20:21)  Mean Arterial Pressure, Cuff: 84 mmHg (07/02/16 05:44:51)  Mean  Arterial Pressure, Cuff: 87 mmHg (07/02/16 02:19:05)  Mean Arterial Pressure, Cuff: 96 mmHg (07/01/16 23:13:44)  Mean Arterial Pressure, Cuff: 78 mmHg (07/01/16 22:23:37)  Mean Arterial Pressure, Cuff: 120 mmHg (07/01/16 13:41:24)  Peripheral Pulse Rate: 88 bpm (07/03/16 05:49:01)  Peripheral Pulse Rate: 75 bpm (07/03/16 01:51:16)  Peripheral Pulse Rate: 75 bpm (07/02/16 21:45:59)  Peripheral Pulse Rate: 77 bpm (07/02/16 18:22:16)  Peripheral Pulse Rate: 75 bpm (07/02/16 05:44:51)  Respiratory Rate: 18 br/min (07/03/16 09:37:33)  Respiratory Rate: 16 br/min (07/03/16 05:49:01)  Respiratory Rate: 17 br/min (07/03/16 01:51:16)  Respiratory Rate: 17 br/min (07/02/16 21:45:59)  Respiratory Rate: 17 br/min (07/02/16 18:22:16)  Respiratory Rate: 16 br/min (07/02/16 13:42:58)  Respiratory Rate: 16 br/min (07/02/16 09:20:21)  Respiratory Rate: 15 br/min (07/02/16 05:44:51)  Respiratory  Rate: 15 br/min (07/02/16 02:19:05)  Respiratory Rate: 16 br/min (07/01/16 23:13:44)  Respiratory Rate: 15 br/min (07/01/16 22:23:37)  Respiratory Rate: 16 br/min (07/01/16 20:12:10)  Respiratory Rate: 14 br/min (07/01/16 19:56:38)  Respiratory Rate: 14 br/min (07/01/16 19:42:47)  Respiratory Rate: 12 br/min (07/01/16 19:40:33)  Respiratory Rate: 24 br/min High (07/01/16 19:36:42)  Respiratory Rate: 14 br/min (07/01/16 19:35:13)  Respiratory Rate: 20 br/min (07/01/16 19:35:13)  Respiratory Rate: 16 br/min (07/01/16 19:03:28)  Respiratory Rate: 14 br/min (07/01/16 18:59:29)  Respiratory Rate: 16 br/min (07/01/16 13:41:24)  Respiratory Rate: 16 br/min (07/01/16 62:70:35)  Systolic Blood Pressure: 009 mmHg (07/03/16 38:18:29)  Systolic Blood Pressure: 937 mmHg (07/03/16 16:96:78)  Systolic Blood Pressure: 938 mmHg (07/03/16 10:17:51)  Systolic Blood Pressure: 025 mmHg (07/02/16 85:27:78)  Systolic Blood Pressure: 242 mmHg (07/02/16 35:36:14)  Systolic Blood Pressure: 431 mmHg (07/02/16 54:00:86)  Systolic Blood Pressure: 761 mmHg (07/02/16 95:09:32)  Systolic Blood Pressure: 671 mmHg (07/02/16 24:58:09)  Systolic Blood Pressure: 983 mmHg (07/02/16 38:25:05)  Systolic Blood Pressure: 397 mmHg (07/01/16 67:34:19)  Systolic Blood Pressure: 379 mmHg (07/01/16 02:40:97)  Systolic Blood Pressure: 353 mmHg (07/01/16 29:92:42)  Systolic Blood Pressure: 683 mmHg (07/01/16 41:96:22)  Systolic Blood Pressure: 297 mmHg (07/01/16 98:92:11)  Systolic Blood Pressure: 941 mmHg (07/01/16 74:08:14)  Systolic Blood Pressure: 481 mmHg (07/01/16 85:63:14)  Systolic Blood Pressure: 970 mmHg High (07/01/16 26:37:85)  Systolic Blood Pressure: 885 mmHg High (07/01/16 02:77:41)  Systolic Blood Pressure: 287 mmHg High (07/01/16 86:76:72)  Systolic Blood Pressure: 094 mmHg (07/01/16 70:96:28)  Systolic Blood Pressure: 366 mmHg High (07/01/16 29:47:65)  Systolic Blood Pressure: 465 mmHg (07/01/16 12:32:14)  Temperature Oral: 36.5 degC (07/03/16  05:49:01)  Temperature Oral: 36.8 degC (07/03/16 01:51:16)  Temperature Oral: 36.8 degC (07/02/16 21:45:59)  Temperature Oral: 37 degC (07/02/16 18:22:16)  Temperature Oral: 36.5 degC (07/02/16 13:42:58)  Temperature Oral: 36.6 degC (07/02/16 09:20:21)  Temperature Oral: 36.4 degC (07/02/16 05:44:51)  Temperature Oral: 36.6 degC (07/02/16 02:19:05)  Temperature Oral: 36.8 degC (07/01/16 23:13:44)  Temperature Oral: 36.5 degC (07/01/16 22:23:37)  Temperature Oral: 37 degC (07/01/16 19:42:47)  Temperature Oral: 36.4 degC (07/01/16 18:59:29)  Temperature Oral: 36.6 degC (07/01/16 13:41:24)       Anita Vazquez,  Anita Vazquez - 07/03/2016 10:39 EDT   Respiratory   Respiratory Status Results :   Rehab Respiratory Grouper  Respirations: Unlabored (07/03/16 00:11:19)  Respirations: Unlabored (07/02/16 08:49:58)  Respirations: Unlabored (07/01/16 23:21:28)  Respirations: Unlabored (07/01/16 20:12:10)  Respirations: Unlabored (07/01/16 19:56:38)  Respirations: Unlabored (07/01/16 19:42:47)  Respirations: Unlabored (07/01/16 19:40:33)  Respirations: Unlabored (07/01/16 19:36:42)  Respirations: Unlabored (07/01/16 19:35:13)  Respirations: Unlabored (07/01/16 19:03:28)  Respirations: Unlabored (07/01/16 19:03:28)  Respirations: Unlabored (07/01/16 13:53:05)  Respirations: Unlabored (07/01/16 08:09:50)  Respiratory Pattern: Regular (07/03/16  00:11:19)  Respiratory Pattern: Regular (07/02/16 08:49:58)  Respiratory Pattern: Regular (07/01/16 23:21:28)  Respiratory Pattern: Regular (07/01/16 20:12:10)  Respiratory Pattern: Regular (07/01/16 19:56:38)  Respiratory Pattern: Regular (07/01/16 19:42:47)  Respiratory Pattern: Regular (07/01/16 19:40:33)  Respiratory Pattern: Regular (07/01/16 19:36:42)  Respiratory Pattern: Regular (07/01/16 19:35:13)  Respiratory Pattern: Regular (07/01/16 19:03:28)  Respiratory Pattern: Regular (07/01/16 19:03:28)  Respiratory Pattern: Regular (07/01/16 13:53:05)  Respiratory Pattern: Regular (07/01/16  08:09:50)  Respiratory Symptoms: None (07/03/16 00:11:19)  Respiratory Symptoms: None (07/02/16 23:56:18)  Respiratory Symptoms: None (07/02/16 08:49:58)  Respiratory Symptoms: None (07/01/16 23:21:28)  Respiratory Symptoms: None (07/01/16 19:52:41)  Respiratory Symptoms: None (07/01/16 19:51:01)  Respiratory Symptoms: None (07/01/16 13:58:21)       Respiratory Needs :   Room air   Anita Vazquez,  Anita Vazquez - 07/03/2016 10:39 EDT   Bowel and Bladder   Premorbid Bladder Management :   Continent   Premorbid Urinary Elimination :   Voiding, no difficulties   Urinary Elimination :   Voiding, no difficulties   Bladder Management :   Continent   Bowel Results :   No qualifying data available.     Premorbid Bowel Management :   Continent   Premorbid Bowel Program :   None   Bowel Program :   None   Bowel Management :   Continent   Ostomy :   No   Anita Vazquez,  Anita Vazquez - 07/03/2016 10:39 EDT   Wound   Incision Wound Results :   Rehab Incision/Wound Information  Ankle Right - Incision, Wound Dressing Assessment: Clean, Dry, Intact (07/02/16 23:56:18)  Ankle Right - Incision, Wound Dressing Assessment: Clean, Dry, Intact (07/02/16 08:49:58)  Ankle Right - Incision, Wound Dressing Assessment: Clean, Dry, Intact (07/01/16 23:21:28)  Ankle Right - Incision, Wound Dressing Assessment: Clean, Dry, Intact (07/01/16 20:12:10)  Ankle Right - Incision, Wound Dressing Assessment: Clean, Dry, Intact (07/01/16 19:56:38)  Ankle Right - Incision, Wound Dressing Assessment: Clean, Dry, Intact (07/01/16 19:42:47)  Ankle Right - Incision, Wound Dressing Assessment: Clean, Dry, Intact (07/01/16 19:03:28)  Ankle Right - Incision, Wound Dressing: Adhesive bandage, Composite (07/02/16 23:56:18)  Ankle Right - Incision, Wound Dressing: Composite (07/02/16 08:49:58)  Ankle Right - Incision, Wound Dressing: Composite, Elastic wrap bandage (07/01/16 23:21:28)  Ankle Right - Incision, Wound Dressing: Composite, Elastic wrap bandage, Gauze bandage roll  (07/01/16 20:12:10)  Ankle Right - Incision, Wound Dressing: Composite, Elastic wrap bandage, Gauze bandage roll (07/01/16 19:42:47)  Ankle Right - Incision, Wound Dressing: Composite, Elastic wrap bandage, Gauze dressing (07/01/16 19:03:28)  Ankle Right - Incision,Wound Surrounding Tissue Color: Normal (07/01/16 20:12:10)  Ankle Right - Incision,Wound Surrounding Tissue Color: Normal (07/01/16 19:56:38)  Ankle Right - Incision,Wound Surrounding Tissue Color: Normal (07/01/16 19:42:47)  Ankle Right - Incision,Wound Surrounding Tissue Color: Normal (07/01/16 19:03:28)  Ankle Right - Skin Abnormality Type: Surgical incision (07/02/16 23:56:18)  Ankle Right - Skin Abnormality Type: Surgical incision (07/02/16 08:49:58)  Ankle Right - Skin Abnormality Type: Surgical incision (07/01/16 23:21:28)  Ankle Right - Skin Abnormality Type: Surgical incision (07/01/16 20:12:10)  Ankle Right - Skin Abnormality Type: Surgical incision (07/01/16 19:42:47)  Ankle Right - Skin Abnormality Type: Surgical incision (07/01/16 19:03:28)  Ankle Right - Wound Exudate Amount: None (07/01/16 23:21:28)  Ankle Right - Wound Exudate Amount: None (07/01/16 20:12:10)  Ankle Right - Wound Exudate Amount: None (07/01/16 19:56:38)  Ankle Right - Wound Exudate Amount: None (07/01/16 19:42:47)  Ankle Right - Wound Exudate Amount: None (07/01/16 19:03:28)  Anita Vazquez - 07/03/2016 10:39 EDT   Functional Status   Languages :   English   Preferred Mode of Communication :   Verbal   Supports in Hospital? :   Glasses   Functional Status Results :   Rehab Functional Status Grouper  ADL Index Score: 9 (07/02/16 23:56:18)  ADL Index Score: 8 (07/02/16 08:49:58)  ADL Index Score: 8 (07/01/16 23:21:28)  ADLs: Minimal assist (07/02/16 23:56:18)  ADLs: Minimal assist (07/02/16 08:49:58)  ADLs: Minimal assist (07/01/16 23:21:28)  ADLs: Independent (07/01/16 14:39:21)  Ambulation Distance: 2 ft (07/03/16 05:45:53)  Ambulation Distance: 4 ft (07/02/16  13:28:37)  Ambulation Distance: 4 ft (07/02/16 09:50:23)  Antiembolism Device: Intermittent pneumatic compression devices, knee high (07/01/16 19:54:23)  Antiembolism Device: Intermittent pneumatic compression devices, knee high (07/01/16 13:53:05)  Bathing ADL Index: Independent (2) (07/02/16 23:56:18)  Bathing ADL Index: Requires assistance (1) (07/02/16 08:49:58)  Bathing ADL Index: Requires assistance (1) (07/01/16 23:21:28)  Continence ADL Index: Independent (2) (07/02/16 23:56:18)  Continence ADL Index: Independent (2) (07/02/16 08:49:58)  Continence ADL Index: Independent (2) (07/01/16 23:21:28)  Demos Ability-Uses Call Light w/ Success: Yes (07/01/16 15:42:59)  Dressing ADL Index: Requires assistance (1) (07/02/16 23:56:18)  Dressing ADL Index: Requires assistance (1) (07/02/16 08:49:58)  Dressing ADL Index: Requires assistance (1) (07/01/16 23:21:28)  Elimination Assistance Offered Q2H: Offered/Declined (07/03/16 09:37:33)  Elimination Assistance Offered Q2H: Offered/Declined (07/03/16 08:16:40)  Elimination Assistance Offered Q2H: Offered/Performed (07/03/16 05:45:53)  Elimination Assistance Offered Q2H: Offered/Declined (07/02/16 21:45:59)  Elimination Assistance Offered Q2H: Offered/Declined (07/02/16 18:23:48)  Elimination Assistance Offered Q2H: Offered/Declined (07/02/16 13:43:43)  Elimination Assistance Offered Q2H: Offered/Performed (07/02/16 09:50:23)  Elimination Assistance Offered Q2H: Offered/Declined (07/02/16 09:23:46)  Elimination Assistance Offered Q2H: Offered/Declined (07/02/16 05:44:51)  Elimination Assistance Offered Q2H: Offered/Declined (07/02/16 02:19:05)  Elimination Assistance Offered Q2H: Offered/Declined (07/01/16 22:23:37)  Elimination Assistance Offered Q2H: Offered/Declined (07/01/16 13:42:10)  Environmental Safety Implemented: Adequate room lighting, Bed exit alarm, Bed in low position, Call device within reach, Non-slip footwear, Patient specific safety measures, Personal  items within reach, Traffic path in room free of clutter, Wheels locked (07/03/16 09:37:33)  Environmental Safety Implemented: Adequate room lighting, Bed exit alarm, Bed in low position, Call device within reach, Non-slip footwear, Patient specific safety measures, Personal items within reach, Traffic path in room free of clutter, Wheels locked (07/03/16 08:16:40)  Environmental Safety Implemented: Adequate room lighting, Attend patient with toileting, Bed exit alarm, Bed in low position, Call device within reach, Encourage handrail/safety bar use, Fall risk magnet on door, Night light, Non-slip footwear, Personal items within reach, Traffic path in room free of clutter, Upper/Half length side rails for bed mobility, Wheels locked (07/03/16 05:45:53)  Environmental Safety Implemented: Adequate room lighting, Bed exit alarm, Bed in low position, Call device within reach, Non-slip footwear, Traffic path in room free of clutter, Wheels locked (07/03/16 01:48:41)  Environmental Safety Implemented: Adequate room lighting, Bed exit alarm, Bed in low position, Call device within reach, Fall risk magnet on door, Night light, Non-slip footwear, Personal items within reach, Traffic path in room free of clutter, Upper/Half length side rails for bed mobility, Wheels locked (07/02/16 21:45:59)  Environmental Safety Implemented: Adequate room lighting, Bed exit alarm, Bed in low position, Call device within reach, Encourage handrail/safety bar use, Fall risk magnet on door, Non-slip footwear, Personal items within reach, Traffic path in room free of clutter, Upper/Half length side rails for bed mobility, Wheels locked (07/02/16 18:23:48)  Environmental Safety Implemented: Adequate room lighting, Bed exit alarm, Bed in  low position, Call device within reach, Fall risk magnet on door, Non-slip footwear, Personal items within reach, Traffic path in room free of clutter, Wheels locked, Other: friend at bedside (07/02/16  13:43:43)  Environmental Safety Implemented: Adequate room lighting, Attend patient with toileting, Bed exit alarm, Bed in low position, Call device within reach, Fall risk magnet on door, Non-slip footwear, Personal items within reach, Traffic path in room free of clutter, Wheels locked (07/02/16 09:50:23)  Environmental Safety Implemented: Adequate room lighting, Bed exit alarm, Bed in low position, Call device within reach, Fall risk magnet on door, Non-slip footwear, Personal items within reach, Traffic path in room free of clutter, Wheels locked (07/02/16 09:23:46)  Environmental Safety Implemented: Adequate room lighting, Bed exit alarm, Bed in low position, Call device within reach, Fall risk magnet on door, Night light, Non-slip footwear, Personal items within reach, Traffic path in room free of clutter, Upper/Half length side rails for bed mobility, Wheels locked (07/02/16 05:44:51)  Environmental Safety Implemented: Adequate room lighting, Bed exit alarm, Bed in low position, Call device within reach, Fall risk magnet on door, Night light, Non-slip footwear, Personal items within reach, Traffic path in room free of clutter, Upper/Half length side rails for bed mobility, Wheels locked (07/02/16 02:19:05)  Environmental Safety Implemented: Adequate room lighting, Bed exit alarm, Bed in low position, Call device within reach, Fall risk magnet on door, Night light, Non-slip footwear, Personal items within reach, Traffic path in room free of clutter, Upper/Half length side rails for bed mobility, Wheels locked (07/01/16 22:23:37)  Environmental Safety Implemented: Adequate room lighting, Bed exit alarm, Bed in low position, Call device within reach (07/01/16 15:42:59)  Environmental Safety Implemented: Adequate room lighting, Bed exit alarm, Bed in low position, Call device within reach (07/01/16 15:42:29)  Environmental Safety Implemented: Adequate room lighting, Bed exit alarm, Bed in low position, Call  device within reach, Fall risk magnet on door, Non-slip footwear, Personal items within reach, Traffic path in room free of clutter, Wheels locked (07/01/16 13:42:10)  Feeding ADL Index: Independent (2) (07/02/16 23:56:18)  Feeding ADL Index: Independent (2) (07/02/16 08:49:58)  Feeding ADL Index: Independent (2) (07/01/16 23:21:28)  Head of Bed Position: 30 - 45 Degrees (semi-Fowlers) (07/03/16 09:37:33)  Head of Bed Position: 30 - 45 Degrees (semi-Fowlers) (07/03/16 08:16:40)  Head of Bed Position: 30 - 45 Degrees (semi-Fowlers) (07/03/16 05:45:53)  Head of Bed Position: 45 - 80 Degrees (07/03/16 01:48:41)  Head of Bed Position: 45 - 80 Degrees (07/02/16 21:45:59)  Head of Bed Position: 30 - 45 Degrees (semi-Fowlers) (07/02/16 18:23:48)  Head of Bed Position: 30 - 45 Degrees (semi-Fowlers) (07/02/16 13:43:43)  Head of Bed Position: 30 - 45 Degrees (semi-Fowlers) (07/02/16 09:50:23)  Head of Bed Position: 30 - 45 Degrees (semi-Fowlers) (07/02/16 09:23:46)  Head of Bed Position: 30 - 45 Degrees (semi-Fowlers) (07/02/16 05:44:51)  Head of Bed Position: 30 Degrees or less (07/02/16 02:19:05)  Head of Bed Position: 30 Degrees or less (07/01/16 22:23:37)  Head of Bed Position: 30 - 45 Degrees (semi-Fowlers) (07/01/16 13:42:10)  Patient Position: Head of bed elevated (07/03/16 09:37:33)  Patient Position: Head of bed elevated, Supine (07/03/16 08:16:40)  Patient Position: Head of bed elevated (07/03/16 05:45:53)  Patient Position: Head of bed elevated (07/03/16 01:48:41)  Patient Position: Head of bed elevated (07/02/16 21:45:59)  Patient Position: Head of bed elevated, Supine (07/02/16 18:23:48)  Patient Position: Head of bed elevated (07/02/16 13:43:43)  Patient Position: Head of bed elevated (07/02/16 09:50:23)  Patient Position: Head of  bed elevated (07/02/16 09:23:46)  Patient Position: Head of bed elevated, Supine (07/02/16 05:44:51)  Patient Position: Head of bed flat, Supine (07/02/16 02:19:05)  Patient Position:  Head of bed flat, Supine (07/01/16 22:23:37)  Patient Position: Head of bed elevated, Supine (07/01/16 15:42:29)  Patient Position: Head of bed elevated (07/01/16 13:42:10)  Positioning/Pressure Reducing Devices: Heel off loading device (07/03/16 09:37:33)  Positioning/Pressure Reducing Devices: Heel off loading device (07/03/16 08:16:40)  Positioning/Pressure Reducing Devices: Heel off loading device, Pillow (07/03/16 05:45:53)  Positioning/Pressure Reducing Devices: Heel off loading device (07/03/16 01:48:41)  Positioning/Pressure Reducing Devices: Heel off loading device, Pillow (07/02/16 21:45:59)  Positioning/Pressure Reducing Devices: Heel off loading device (07/02/16 18:23:48)  Positioning/Pressure Reducing Devices: Heel off loading device, Pillow (07/02/16 13:43:43)  Positioning/Pressure Reducing Devices: Heel off loading device, Pillow (07/02/16 09:50:23)  Positioning/Pressure Reducing Devices: Heel off loading device, Pillow (07/02/16 09:23:46)  Positioning/Pressure Reducing Devices: Heel off loading device, Pillow (07/02/16 05:44:51)  Positioning/Pressure Reducing Devices: Heel off loading device, Pillow (07/02/16 02:19:05)  Positioning/Pressure Reducing Devices: Heel off loading device, Pillow (07/01/16 22:23:37)  Positioning/Pressure Reducing Devices: Pillow (07/01/16 13:42:10)  Toileting ADL Index: Requires assistance (1) (07/02/16 23:56:18)  Toileting ADL Index: Requires assistance (1) (07/02/16 08:49:58)  Toileting ADL Index: Requires assistance (1) (07/01/16 23:21:28)  Transferring Bed or Chair ADL Index: Requires assistance (1) (07/02/16 23:56:18)  Transferring Bed or Chair ADL Index: Requires assistance (1) (07/02/16 08:49:58)  Transferring Bed or Chair ADL Index: Requires assistance (1) (07/01/16 23:21:28)       Anita Vazquez,  Anita Vazquez - 07/03/2016 10:39 EDT   Premorbid Medication Self Management   Bathing :   Complete independence   Bed Mobility :   Complete independence   Bed Wheelchair Transfer :    Complete independence   Bladder :   Complete independence   Bowel :   Complete independence   Eating :   Complete independence   Grooming :   Complete independence   Locomotion Walk :   Complete independence   Locomotion Wheelchair :   Complete independence   Lower Extremity Dressing :   Complete independence   Sit to Stand :   Complete independence   Supine to Sit :   Complete independence   Toilet Transfer :   Complete independence   Toileting :   Complete independence   Tub, Shower Transfer :   Complete independence   Upper Extremity Dressing :   Complete independence   Comprehension :   Complete independence   Expression :   Complete independence   Cognition :   Complete independence   Social Interaction :   Complete independence   Medication Self Management :   Complete independence   Anita Vazquez,  Anita Vazquez - 07/03/2016 10:39 EDT   Current Medication Self-Management   Bathing :   Supervision or setup   Bed Mobility :   Supervision or setup   Bed Wheelchair Transfer :   Supervision or setup   Bladder :   Modified independence   Bowel :   Modified independence   Eating :   Supervision or setup   Grooming :   Supervision or setup   Locomotion Walk :   Minimal contact assistance   Locomotion Wheelchair :   Minimal contact assistance   Lower Extremity Dressing :   Minimal contact assistance   Sit to Stand :   Minimal contact assistance   Supine to Sit :   Minimal contact assistance   Toilet Transfer :   Minimal  contact assistance   Toileting :   Minimal contact assistance   Tub, shower transfer :   Minimal contact assistance   Upper Extremity Dressing :   Minimal contact assistance   Comprehension :   Complete independence   Expression :   Complete independence   Cognition :   Complete independence   Social Interaction :   Complete independence   Medication Self-Management :   Complete independence   Anita Vazquez,  Anita Vazquez - 07/03/2016 10:39 EDT   Anticipated Rehab Goals   Bathing :   Complete independence   Bed Mobility  :   Complete independence   Bed Wheelchair Transfer :   Complete independence   Bladder :   Complete independence   Bowel :   Complete independence   Eating :   Complete independence   Grooming :   Complete independence   Locomotion Walk :   Complete independence   Locomotion Wheelchair :   Complete independence   Lower Extremity Dressing :   Complete independence   Sit to Stand :   Complete independence   Supine to Sit :   Complete independence   Toilet Transfer :   Complete independence   Toileting :   Complete independence   Tub, Shower Transfer :   Complete independence   Upper Extremity Dressing :   Complete independence   Comprehension :   Complete independence   Expression :   Complete independence   Cognition :   Complete independence   Social Interaction :   Complete independence   Medication Self-Management :   Complete independence   Anita Vazquez,  Anita Vazquez - 07/03/2016 10:39 EDT   Care Tool Section GG: Functional Abilities and Goals   Functional Abilities and Goals Grid   GG0100 Self Care :   Independent - Patient/Resident completed the activities by him/herself, with or without an assistive device, with no assistance from a helper. - 3   GG0100 Indoor Mobility (Ambulation) :   Independent - Patient/Resident completed the activities by him/herself, with or without an assistive device, with no assistance from a helper. - 3   GG0100 Stairs :   Independent - Patient/Resident completed the activities by him/herself, with or without an assistive device, with no assistance from a helper. - 3   GG0100 Functional Cognition :   Independent - Patient/Resident completed the activities by him/herself, with or without an assistive device, with no assistance from a helper. - 3   Anita Vazquez,  Anita Vazquez - 07/03/2016 10:39 EDT   GG0110 Prior Device Use :   None of the below   Anita Vazquez,  Anita Vazquez - 07/03/2016 10:39 EDT   Home Environment   Home Environment Results :   Home Environment Rehab  Lives With: Alone (07/02/16  13:28:37)  Living Situation: Home independently (07/02/16 13:28:37)  Number of Stairs Outside: 15 (07/02/16 13:28:37)  Prior ADL Status: Independent (07/02/16 13:28:37)  Prior ADL Status: Independent (07/02/16 13:12:03)  Prior Cognitive-Communication Skills: Independent (07/02/16 13:28:37)  Prior Cognitive-Communication Skills: Independent (07/02/16 13:12:03)  Prior Instrumental ADL Level: Independent (07/02/16 13:28:37)  Prior Instrumental ADL Level: Independent (07/02/16 13:12:03)  Prior Mobility Status: Independent (07/02/16 13:28:37)       Living Situation :   Home independently   Lives With :   Alone   Lives In :   Single level home   Anticipated Need for Home Modification :   No   Anita Vazquez,  Anita Vazquez - 07/03/2016 10:39 EDT   Rehabilitation  Stairs Grid     Outside [place]          Number of Stairs :    15               Comments  (Comment: indenpendent Anita Vazquez - 07/03/2016 10:39 EDT] )         Anita Vazquez - 07/03/2016 10:39 EDT         Home Setup Grid   Primary Bedroom :   1st floor   Primary Bathroom :   1st floor   Kitchen :   1st floor   Laundry :   1st floor   Merilyn Baba - 07/03/2016 10:39 EDT   Patient's Responsibilities Rehab :   Driver, Employed, Health and wellness, 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, Laundry, Manage Medications, Meal preparation, Personal ADL, Retired, Actor   Patient's Lifestyle :   Active   Preadm Amt/Type of Assist Caregiver Able to Prov :   24-hour Supervision, Bathing, Dressing, Hygiene, Lifting, Meals, Medication management, Safety monitoring, Taking to follow up appointment, Toileting, Wound care   Culture/Spiritual Beliefs to Incorporate :   No   Anita Vazquez,  Anita Vazquez - 07/03/2016 10:39 EDT   Plan   Barriers to Safe Discharge :   Medical instability   Preadm Expected Level of Improvement :   Good   Preadm Patient/Family Agreement :   No   Rehab Expected Length of Stay :   10 days   Preadm PT Hours/Day :   1.5   Preadm PT Days/Week :   5   Preadm PT Interventions :    Ambulation, Body mechanics, Cognition, Endurance, Family training, Mobility   Preadm PT Duration :   2 weeks   Preadm OT Hours/Day :   1.5   Preadm OT Days/Week :   5   Preadm OT Interventions :   Bathing, Body mechanics, Cognition, Dressing, Endurance, Family training, Feeding, Grooming, Safety, Toileting, Transfers   Preadm OT Duration :   2 weeks   Preadm SLP Hours/Day :   N/A   Rehab Nursing Care :   24/7   Preadm Nursing Interventions :   Health management, Medication management, Pain management, Reinforcement of self-care/mobility skills   Preadm Neuropsychology Hours/Day :   N/A   Preadm Neuropsychology Days/Week :   N/A   Preadm Neuropsychology Interventions :   N/A   Preadm Prosthetics/Orthotics Hrs/Day :   N/A   Preadm Prosthetics/Orthotics Days/Week :   N/A   Merilyn Baba - 07/03/2016 10:39 EDT   Benefits   Insurance Contact Information :   Approved 7 days  Auth# [id number]  Update due 07/10/16  Sherlie Ban ph# [phone] ext. [phone], [phone]# [phone]   Other Benefits :   BCBS ID# [id number]  active/effective 01/08/16 current benefit period 11/10/15-11/09/16  70% covered  ded $1000/$484.08 met  oop max $ 3500/$484.08 met   Clare Charon - 07/03/2016 11:09 EDT   Insurance Information :   [place]   [place],  [place] Vazquez - 07/03/2016 10:39 EDT   Preadmission Review   Preadmission Screening Revisions :   from reading therapy notes she requires moderate assist to walk 4 feet and has difficulty maintaining non weight bearing status . 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  she has been apprpoved by her insurance carrier for intensive rehab, and would benefit from therapy to increase her safety and strength as well as help her problem solve how she will manage after discharge until she is able to bear weight .     Preadm Prognosis :   Good   Requires Inpatient Rehab Services :   Physical Therapy, Occupational Therapy, Nursing, Case Management, Social Work   Anticipated Discharge Destination :   Home under care of organized home health service org   Anticipated Discharge Services :   Physical Therapy, Occupational Therapy, Nursing   Physician Review of Preadmission Screening :   I agree with the preadmission screening as documented   Faustino Congress - 07/03/2016 11:26 EDT

## 2016-07-03 NOTE — Nursing Note (Signed)
Nursing Discharge Summary - Text       Nursing Discharge Summary Entered On:  07/03/2016 14:40 EDT    Performed On:  07/03/2016 14:38 EDT by Matthias Hughs'ANGELO, RN, ALEXANDRA               DC Information   Discharge To, Anticipated :   Acute Rehab   Mode of Discharge :   Stretcher   Transportation :   Ground ambulance   Accompanied By :   Luther HearingSpouse   D'ANGELO, RN, ALEXANDRA - 07/03/2016 14:38 EDT   Education   Responsible Learner(s) :   Living Situation: Home independently        Performed by: Gregary CromerLee,  Jennifer M - 07/03/2016 12:33  Discharge To: Acute Rehab        Performed by: Gregary CromerLee,  Jennifer M - 07/03/2016 12:36     Home Caregiver Present for Session :   Yes   Barriers To Learning :   None evident   Teaching Method :   Teach-back   Fanny BienD'ANGELO, RN, ALEXANDRA - 07/03/2016 14:38 EDT   Post-Hospital Education Adult Grid   Activity Expectations :   Trenton GammonVerbalizes understanding   Bladder Management :   Verbalizes understanding   Bowel Management :   Verbalizes understanding   Community Resources :   [employer] SquibbVerbalizes understanding   Diagnostic Results :   Verbalizes understanding   Disease Process :   Verbalizes understanding   Importance of Follow-Up Visits :   Verbalizes understanding   Pain Management :   Verbalizes understanding   Physical Limitations :   Verbalizes understanding   Plan of Care :   Verbalizes understanding   Postoperative Instructions :   Verbalizes understanding   When to Call Health Care Provider :   [employer] Squ[employer]bVerbalizes understanding   Matthias HughsD'ANGELO, RGordy Councilman, ALEXANDRA - 07/03/2016 14:38 EDT   Health Maintenance Education Adult Grid   Diet/Nutrition :   TEFL teacherVerbalizes understanding   Exercise :   Verbalizes understanding   D'ANGELO, RN, Gordy CouncilmanALEXANDRA - 07/03/2016 14:38 EDT   Medication Education Adult Grid   Med Dosage, Route, Scheduling :   TEFL teacherVerbalizes understanding   Med Generic/Brand Name, Purpose, Action :   Verbalizes understanding   Matthias HughsD'ANGELO, RGordy Councilman, ALEXANDRA - 07/03/2016 14:38 EDT   Safety Education Adult Grid   Safety, Fall :   Verbalizes understanding    ParrishD'ANGELO, RN, Gordy CouncilmanLEXANDRA - 07/03/2016 14:38 EDT   Time Spent Educating Patient :   15 minutes   Matthias HughsD'ANGELO, RN, ALEXANDRA - 07/03/2016 14:38 EDT

## 2016-07-03 NOTE — Nursing Note (Signed)
Medication Administration Follow Up-Text       Medication Administration Follow Up Entered On:  07/03/2016 6:41 EDT    Performed On:  07/03/2016 4:55 EDT by Shirl Harris, RN, EDWIN L      Intervention Information:     ondansetron  Performed by Shirl Harris, RN, EDWIN L on 07/03/2016 04:40:00 EDT       ondansetron,4mg   IV Push,Wrist, Left,nausea/vomiting       Medication Effectiveness Evaluation   Medication Administration Reason :   Vomiting   Medication Effective :   Yes   Medication Response :   Symptoms improved   TASSIN, RN, EDWIN L - 07/03/2016 6:41 EDT

## 2016-07-03 NOTE — Progress Notes (Signed)
Nursing Admission Indep Measure - Text       Nursing Admission Indep Measure Scores Entered On:  07/03/2016 15:23 EDT    Performed On:  07/03/2016 15:22 EDT by Milon Dikes, RN, Tonye Becket               Admission Scores   Activities of Daily Living Grid   Eating :   Complete independence   Toileting :   Modified independence   Bed, Chair, Wheelchair Transfer :   Modified independence   Toilet Transfer :   Modified independence   TUELL, RN, DEBRA A - 07/06/2016 10:45 EDT   *Number of Bowel Accidents Past Four Days Prior to Admission :   0    *Number of Bladder Accidents Past Four Days Prior to Admission :   0    Kemmerlin, RN, Tonye Becket - 07/03/2016 15:22 EDT   Goals   Bladder Level of Assistance Goal :   Complete independence   Bowel Level of Assistance Goal :   Complete independence   Kemmerlin, RN, Tonye Becket - 07/03/2016 15:22 EDT

## 2016-07-03 NOTE — Progress Notes (Signed)
Functional Indep Measure Scores - Text       Functional Independence Measure Scores Entered On:  07/03/2016 15:23 EDT    Performed On:  07/03/2016 15:22 EDT by Milon DikesKemmerlin, RN, Tonye BecketBrittany Miller               Eating Score   Patient's Independence Level With Eating Tasks :   Independent/Modified independence   Independent or Modifications Needed :   Complete independence   Functional Independence Measure Eating :   Complete independence   Kemmerlin, RN, Tonye BecketBrittany Miller - 07/03/2016 15:22 EDT   Toileting Score   Patient's Independence Level with Toileting Tasks :   Assistance   Type of Assistance Necessary :   Incidental help for contact guard or steadying   Toileting :   Minimal contact assistance   Kemmerlin, RN, Tonye BecketBrittany Miller - 07/03/2016 15:22 EDT   Bladder Management Score   Patient's Independence Level with Bladder Management Tasks :   Setup/Supervision   InM Bladder Mgmt Supervision,Setup Needs :   Cueing, Empty device, Setup device, Standby prompting   Functional Independence Measure Bladder Management :   Supervision or setup   Kemmerlin, RN, Tonye BecketBrittany Miller - 07/03/2016 15:22 EDT   Bowel Management Score   Patient's Independence Level with Bowel Management Tasks :   Setup/Supervision   Supervision or Setup Needed :   Cueing, Setup equipment, Standby prompting   Functional Independence Measure Bowel Management :   Supervision or setup   Kemmerlin, RN, Tonye BecketBrittany Miller - 07/03/2016 15:22 EDT   Transfer Bed/Chair/WC Score   Patient's independence Level with Bed, Chair, Wheelchair Tasks :   Assistance   Type of Assistance Necessary :   Lifting or lowering patient   Bed, Chair, Wheelchair Transfer :   Moderate assistance   Kemmerlin, RN, Tonye BecketBrittany Miller - 07/03/2016 15:22 EDT   Admission Information   Swallowing Status on Admission :   Regular food   Kemmerlin, RN, Tonye BecketBrittany Miller - 07/03/2016 15:22 EDT

## 2016-07-03 NOTE — Progress Notes (Signed)
IRF-PAI Quality Indicators Adm -Text       IRF-PAI Quality Indicators Admission Entered On:  07/03/2016 15:24 EDT    Performed On:  07/03/2016 15:22 EDT by Milon DikesKemmerlin, RN, Tonye BecketBrittany Miller               Section B: Hearing Speech and Vision   BB0700 Expression of Ideas and Wants :   Expresses complex messages without difficulty and with speech that is clear and easy to understand - 4   BB0800 Understanding Verbal Content :   Understands - Clear comprehension without cues or repetitions - 4   Kemmerlin, RN, Tonye BecketBrittany Miller - 07/03/2016 15:22 EDT   Section H: Bladder and Bowel   H0350 Bladder Continence :   Always continent   H0400 Bowel Continence :   Always continent   Kemmerlin, RN, Tonye BecketBrittany Miller - 07/03/2016 15:22 EDT   Section J: Admission Health Conditions   J1750 2 or More Fall,Fall Injury Last Yr :   Yes   J2000 Major Surgery 100 Days Pre Admit :   Yes   Kemmerlin, RN, Tonye BecketBrittany Miller - 07/03/2016 15:22 EDT

## 2016-07-03 NOTE — Nursing Note (Signed)
Medication Administration Follow Up-Text       Medication Administration Follow Up Entered On:  07/03/2016 6:40 EDT    Performed On:  07/02/2016 19:40 EDT by Shirl Harris, RN, EDWIN L      Intervention Information:     acetaminophen-oxycodone  Performed by Matthias Hughs RN, ALEXANDRA on 07/02/2016 18:40:00 EDT       oxyCODONE-acetaminophen,2tabs  Oral,moderate pain (4-7)       Medication Effectiveness Evaluation   Medication Administration Reason :   Pain   Medication Effective :   Yes   Medication Response :   Symptoms improved   TASSIN, RN, EDWIN L - 07/03/2016 6:40 EDT   Pain Assessment   Numeric Rating Pain Scale :   4   TASSIN, RN, EDWIN L - 07/03/2016 6:40 EDT   Image 4 -  Images currently included in the form version of this document have not been included in the text rendition version of the form.

## 2016-07-03 NOTE — Case Communication (Signed)
CM Discharge Planning Assessment - Text       CM Discharge Planning Ongoing Assessment Entered On:  07/03/2016 12:37 EDT    Performed On:  07/03/2016 12:36 EDT by [name]               Discharge Needs I   Previously Documented Discharge Needs :   DISCHARGE PLAN/NEEDS:  Anticipated Discharge Date: 07/03/2016 - [name] - 07/03/16 12:33:00  Discharge To, Anticipated: Acute Rehab - [name] - 07/03/16 12:33:00  Needs Assistance with Transportation: Yes [name] - 07/03/16 12:33:00  EQUIPMENT/TREATMENT NEEDS:  Needs Assistance at Home Upon Discharge: Yes - [name] - 07/03/16 12:33:00  Professional Skilled Services: No Needs - [name] - 07/03/16 12:33:00       Previously Documented Benefits Information :   Performed By: [name]  - 07/03/16 12:33:00       Anticipated Discharge Date :   07/03/2016 EDT   Anticipated Discharge Time Slot :   1400-1600   Discharge To :   Acute Rehab   CM Progress Note :   07/03/16 jml: Per Cassie PT yesterday, pt lives alone and will need acute rehab. She lives on [address] and would prefer [facility]. I contacted [facility] and requested an eval. Precert was started yesterday and approved today. I met w/pt; she is anxious to d/c to rehab today after she speaks w/Dr. Tamera Stands. [facility] arranged for 2 PM. She has notified her family.     [name] - 07/03/2016 12:36 EDT   Discharge Needs II   Professional Skilled Services :   Nursing, Occupational Therapy, Physical Therapy   Needs Assistance with Transportation :   Yes   Needs Assistance at Home Upon Discharge :   Yes   Discharge Planning Time Spent :   10 minutes   [name] - 07/03/2016 12:36 EDT   Benefits   Insurance Information :   [place]   [name] - 07/03/2016 12:36 EDT   Discharge Planning   Discharge Arrangements :       Patient Post-Acute Information    Patient Name: Anita Vazquez, Anita Vazquez  MRN: [id number]  FIN: [id number]  Gender: Female  DOB: 02-05-2056  Age:  60  Years        *** No Post-Acute Placement(s) Listed ***          *** No Post-Acute Service(s) Listed Landscape architect Hospital Services :   [facility]     Patient Choices Made :   07/03/16 jml: Pt chose [facility]     Discharge Options Discussed with Patient :   Rehabilitation Unit   Interventions Performed :   All resolved   Discharge Plan Discussion :   Discussed with patient, Patient agrees with plan   Choice Lists Provided :   Rehab facility   Barriers to Discharge Identified :   None identified   [name] - 07/03/2016 12:36 EDT   Readmission Report   Readmission Report :   Low   Lives alone with limited community support :   Yes   Requires temporary assistance :   With ADL's (ambulation and transfers)   [name] - 07/03/2016 12:36 EDT

## 2016-07-03 NOTE — Nursing Note (Signed)
Medication Administration Follow Up-Text       Medication Administration Follow Up Entered On:  07/03/2016 14:36 EDT    Performed On:  07/03/2016 8:41 EDT by Matthias Hughs, RN, ALEXANDRA      Intervention Information:     acetaminophen-oxycodone  Performed by Matthias Hughs, RN, ALEXANDRA on 07/03/2016 07:41:00 EDT       oxyCODONE-acetaminophen,2tabs  Oral,moderate pain (4-7)       Medication Effectiveness Evaluation   Medication Administration Reason :   Pain   Medication Effective :   Yes   Medication Response :   Symptoms improved   D'ANGELO, RN, ALEXANDRA - 07/03/2016 14:36 EDT

## 2016-07-03 NOTE — Nursing Note (Signed)
Medication Administration Follow Up-Text       Medication Administration Follow Up Entered On:  07/03/2016 14:36 EDT    Performed On:  07/03/2016 12:41 EDT by Matthias Hughs, RN, ALEXANDRA      Intervention Information:     acetaminophen-oxycodone  Performed by Matthias Hughs, RN, ALEXANDRA on 07/03/2016 11:41:00 EDT       oxyCODONE-acetaminophen,2tabs  Oral,moderate pain (4-7)       Medication Effectiveness Evaluation   Medication Administration Reason :   Pain   Medication Effective :   Yes   Medication Response :   Symptoms improved   D'ANGELO, RN, ALEXANDRA - 07/03/2016 14:36 EDT

## 2016-07-03 NOTE — Nursing Note (Signed)
Medication Administration Follow Up-Text       Medication Administration Follow Up Entered On:  07/03/2016 6:40 EDT    Performed On:  07/03/2016 0:28 EDT by Shirl Harris, RN, EDWIN L      Intervention Information:     acetaminophen-oxycodone  Performed by Shirl Harris, RN, EDWIN L on 07/02/2016 23:28:00 EDT       oxyCODONE-acetaminophen,5mg   Oral,moderate pain (4-7)       Medication Effectiveness Evaluation   Medication Administration Reason :   Pain   Medication Effective :   Yes   Medication Response :   Symptoms improved   TASSIN, RN, EDWIN L - 07/03/2016 6:40 EDT   Pain Assessment   Numeric Rating Pain Scale :   4   TASSIN, RN, EDWIN L - 07/03/2016 6:40 EDT   Image 4 -  Images currently included in the form version of this document have not been included in the text rendition version of the form.

## 2016-07-03 NOTE — Nursing Note (Signed)
Adult Patient History Form-Text       Adult Patient History Entered On:  07/03/2016 15:28 EDT    Performed On:  07/03/2016 15:25 EDT by Milon Dikes, RN, Tonye Becket               General Info   Preferred Name :   Anita Vazquez of Arrival on Unit :   Stretcher   In Clinical Trial With Signed Consent for Related Condition :   No signed consent for clinical trial   Information Given By :   Self   Primary Language :   English   Has the patient received chemotherapy or biotherapy within the last 48 hours? :   No   Is the patient currently (2-3 days) receiving radiation treatment? :   No   Kemmerlin, RN, Tonye Becket - 07/03/2016 15:25 EDT   Problem History   (As Of: 07/03/2016 15:28:40 EDT)   Problems(Active)    Alteration in comfort: pain (SNOMED CT  :16109604 )  Name of Problem:   Alteration in comfort: pain ; Recorder:   SYSTEM,  SYSTEM; Confirmation:   Confirmed ; Classification:   Interdisciplinary ; Code:   54098119 ; Last Updated:   07/02/2016 6:20 EDT ; Life Cycle Date:   07/02/2016 ; Life Cycle Status:   Active ; Vocabulary:   SNOMED CT   ; Comments:        07/02/2016 6:20 - SYSTEM,  SYSTEM  Problem added automatically by system based on initiation of Alteration in Comfort Plan of Care      Anemia (SNOMED CT  :ACSYtQDsdrRUkXtoqf79/w )  Name of Problem:   Anemia ; Recorder:   CANADY, RN, BROOKE N; Confirmation:   Confirmed ; Classification:   Medical ; Code:   ACSYtQDsdrRUkXtoqf79/w ; Contributor System:   Dietitian ; Last Updated:   05/19/2016 7:01 EDT ; Life Cycle Date:   05/19/2016 ; Life Cycle Status:   Active ; Vocabulary:   SNOMED CT        Impaired tissue integrity (SNOMED CT  :14782956 )  Name of Problem:   Impaired tissue integrity ; Recorder:   SYSTEM,  SYSTEM; Confirmation:   Confirmed ; Classification:   Interdisciplinary ; Code:   21308657 ; Last Updated:   07/02/2016 6:20 EDT ; Life Cycle Date:   07/02/2016 ; Life Cycle Status:   Active ; Vocabulary:   SNOMED CT   ; Comments:        07/02/2016 6:20 -  SYSTEM,  SYSTEM  Problem added automatically by system based on initiation of Impaired Tissue Integrity Plan of Care        Diagnoses(Active)    Anemia  Date:   07/03/2016 ; Diagnosis Type:   Discharge ; Confirmation:   Confirmed ; Clinical Dx:   Anemia ; Classification:   Medical ; Code:   ICD-10-CM ; Probability:   0 ; Diagnosis Code:   D64.9      Closed bimalleolar fracture  Date:   07/03/2016 ; Diagnosis Type:   Discharge ; Confirmation:   Confirmed ; Clinical Dx:   Closed bimalleolar fracture ; Classification:   Medical ; Code:   ICD-10-CM ; Probability:   0 ; Diagnosis Code:   Q46.962X      History of gastric bypass  Date:   07/03/2016 ; Diagnosis Type:   Discharge ; Confirmation:   Confirmed ; Clinical Dx:   History of gastric bypass ; Classification:   Medical ; Code:   ICD-10-CM ;  Probability:   0 ; Diagnosis Code:   Z98.890      S/P ORIF (open reduction internal fixation) fracture  Date:   07/03/2016 ; Diagnosis Type:   Discharge ; Confirmation:   Confirmed ; Clinical Dx:   S/P ORIF (open reduction internal fixation) fracture ; Classification:   Medical ; Code:   ICD-10-CM ; Probability:   0 ; Diagnosis Code:   Z96.7        Family History   Family History   (As Of: 07/03/2016 15:28:40 EDT)     Allergy   (As Of: 07/03/2016 15:28:40 EDT)   Allergies (Active)   penicillins  Estimated Onset Date:   Unspecified ; Created ByPatsi Sears, RN, BROOKE N; Reaction Status:   Active ; Category:   Drug ; Substance:   penicillins ; Type:   Allergy ; Severity:   Severe ; Updated By:   Annamarie Major; Reviewed Date:   07/03/2016 15:25 EDT        Immunizations   Immunizations Current :   Yes   Last Tetanus :   Less than 5 years   Influenza Vaccine Status :   Non-influenza season (before Oct 1st and after Mar 31st)   Influenza Vaccine Refused :   Yes   Pneumococcal Vaccine Status :   Not received   Pneumococcal Vaccine Refused :   Yes   Kemmerlin, RN, Tonye Becket - 07/03/2016 15:25 EDT   ID Risk Screen   Patient Recent  Travel History :   No recent travel   Family Member Travel History :   No recent travel   Cassandra, RN, Tonye Becket - 07/03/2016 15:25 EDT   Infectious Disease Risk Factor Grid   Chills :   No   Fever :   No   Fatigue :   No   Headache :   No   Runny or Stuffy Nose :   No   Sore Throat :   No   Difficulty Breathing :   No   Shortness of Breath :   No   New or Worsening Cough :   No   Wheezing :   No   Vomiting :   No   Diarrhea :   No   Abdominal (Stomach Pain) :   No   Muscle Pain :   No   Weakness/Numbness :   No   Recent Exposure to Communicable Disease :   No   Illness With Generalized Rash :   No   Abnormal Bleeding :   No   Unexplained Hemorrhage (Bleeding or Bruising) :   No   Arthralgia :   No   Conjunctivitis :   No   Kemmerlin, RN, Tonye Becket - 07/03/2016 15:25 EDT   MRSA/VRE Screening :   Admitted to [facility]   [place], RN, Tonye Becket - 07/03/2016 15:25 EDT   Chills :   No   Cough (Any Duration) :   No   Fever :   No   Hemoptysis (Blood in Sputum) :   No   Night Sweats :   No   Weight Loss Greater Than 10 Pounds :   No   Hx of TB Now or at Any Time In the Past (Even if on Meds) :   No   Foreign-Born :   No   Homeless or In Shelter :   No   Incarcerated Within Last 2 Years :  No   Intravenous Drug User :   No   Female Homosexual :   No   New TST/IGRA Results Pos(Within 2 yrs),Hx Recent TB Exposure :   No   Kemmerlin, RN, Tonye Becket - 07/03/2016 15:25 EDT   Does the Patient Have any of the Following Conditions That Compromise the Immune System :   None   Kemmerlin, RN, Tonye Becket - 07/03/2016 15:25 EDT   C. diff Screen   Have you had 3 or more loose/watery stool in 24 hours? :   No   Kemmerlin, RN, Tonye Becket - 07/03/2016 15:25 EDT   Procedure History   Medical Devices :   None   Kemmerlin, RN, Tonye Becket - 07/03/2016 15:25 EDT        -    Procedure History   (As Of: 07/03/2016 15:28:40 EDT)     Anesthesia Minutes:   0 ; Procedure Name:   Gastric bypass operation ;  Procedure Minutes:   0 ; Last Reviewed Dt/Tm:   07/03/2016 15:26:33 EDT            Anesthesia Minutes:   0 ; Procedure Name:   C section delivery ; Procedure Minutes:   0 ; Last Reviewed Dt/Tm:   07/03/2016 15:26:33 EDT            Procedure Dt/Tm:   07/01/2016 17:44:00 EDT ; Location:   [facility] ; Provider:   Junious Silk; Anesthesia Type:   General ; :   Mady Gemma; Anesthesia Minutes:   0 ; Procedure Name:   Ankle ORIF (Right, Ankle) ; Procedure Minutes:   24 ; Comments:     07/01/2016 18:47 - Mathis Fare, RN, Ladona Horns  auto-populated from documented surgical case ; Clinical Service:   Surgery ; Last Reviewed Dt/Tm:   07/03/2016 15:26:33 EDT            Anesthesia Minutes:   0 ; Procedure Name:   Cholecystectomy ; Procedure Minutes:   0 ; Last Reviewed Dt/Tm:   07/03/2016 15:26:33 EDT            Anesthesia/Sedation   Anesthesia History :   Prior general anesthesia   Anesthesia Reaction :   None   Moderate Sedation History :   Prior sedation for procedure   Previous Problem With Sedation :   None   Kemmerlin, RN, Tonye Becket - 07/03/2016 15:25 EDT   Transfusion/Bloodless Med   Transfusion History :   Prior transfusion without reaction   Will Patient Accept Blood Transfusion and/or Blood Products :   Yes   Kemmerlin, RN, Tonye Becket - 07/03/2016 15:25 EDT   Nutrition   Home Diet :   Regular   Appetite :   Good   Feeding Ability :   Complete independence   Home Liquid Viscosity :   Thin   Nutritional Risk Factors :   Nausea, vomiting, diarrhea   (Comment: gastric bypass sx in past. [Kemmerlin, RN, Tonye Becket - 07/03/2016 15:25 EDT] )   Unintentional Weight Change Greater Than 10 lbs in the Last 6 Months :   No   Kemmerlin, RN, Tonye Becket - 07/03/2016 15:25 EDT   Functional   Cognitive Function Concerns Prior to Admission :   None reported   History of Falls :   Immediately prior to hospitalization   Fear of Falling :   No   Ability to Ambulate Prior to Admission :   Independent   Ability to  Move in Bed Prior to Admission :   Independent   ADLs :   Minimal assist   Sleeping Behaviors :   Difficulty sleeping at night   Kemmerlin, RN, Tonye BecketBrittany Miller - 07/03/2016 15:25 EDT   Functional Assessment   Bathing :   Independent (2)   Dressing :   Requires assistance (1)   Toileting :   Requires assistance (1)   Transferring Bed or Chair :   Requires assistance (1)   Continence :   Independent (2)   Feeding :   Independent (2)   ADL Index Score :   9    Kemmerlin, RN, Tonye BecketBrittany Miller - 07/03/2016 15:25 EDT   Living and Resources   Living Situation :   Home independently   [place] Town[place]emmer[place], RN, Tonye BecketBrittany Miller - 07/03/2016 15:25 EDT   Social History   Social History   (As Of: 07/03/2016 15:28:40 EDT)   Tobacco:        Never smoker, Cigarettes   (Last Updated: 07/01/2016 08:06:54 EDT by Marliss CzarLeigh, RN, Bridgette HabermannKerry A)          Alcohol:        Current, Wine, 1-2 times per week   (Last Updated: 07/01/2016 14:38:39 EDT by Mitzi HansenMOODY, RN, JEAN N)            Sexual Assault/Domestic Violence Screen   Have You Ever Been Emotionally or Physically Abused by Your Partner :   No   Have You Been Physically Hurt by Someone Within the Past Year :   No   Within the Last Year, Has Anyone Forced You to Have Sexual Activity :   No   Kemmerlin, RN, Tonye BecketBrittany Miller - 07/03/2016 15:25 EDT   Spiritual   Do You Receive Comfort From Spiritual Practices :   Yes   Religious Preference :   Eloise Levelshristian, Presbyterian   Hospital Clergy to Visit :   No   Spiritual Advisor/Minister to be Notified :   No   Kemmerlin, RN, Tonye BecketBrittany Miller - 07/03/2016 15:25 EDT   Advance Directive   *Advance Directive :   No   Patient Wishes to Receive Further Information on Advance Directives :   No   Kemmerlin, RN, Tonye BecketBrittany Miller - 07/03/2016 15:25 EDT   Educ Needs   Patient/Family Learning Style Preferences   Patient :   Verbal explanation   Kemmerlin, RN, Tonye BecketBrittany Miller - 07/03/2016 15:25 EDT   Barriers to Learning :   None evident   Preventative Measures Information Given :   Hand Hygiene,  Respiratory Hygiene, Isolation Precautions, Environmental Safety, Rapid Response Team (RRT) program, Venous Thromboembolism (VTE) prevention, Pain Scale, Surgical site infection, Unit/Room Orientation   Kemmerlin, RN, Tonye BecketBrittany Miller - 07/03/2016 15:25 EDT

## 2016-07-03 NOTE — Nursing Note (Signed)
Basic Admission Information - Text       Basic Admission Information Adult Entered On:  07/03/2016 23:20 EDT    Performed On:  07/03/2016 23:20 EDT by Freida BusmanALLEN, RN, RENEE L               Height/Weight   Height/Length Measured :   160 cm(Converted to: 5 ft 3 in, 5.25 ft, 62.99 in)    Ideal Body Weight Calculated :   52.382 kg   ALLEN, RN, RENEE L - 07/03/2016 23:20 EDT   Safety   Environmental Safety in Place :   Adequate room lighting, Attend patient with toileting, Bed exit alarm, Bed in low position, Call device within reach, Personal items within reach, Traffic path in room free of clutter, Upper/Half length side rails for bed mobility, Wheels locked   Demonstrates Ability to Use Call Light :   Yes   Patient Identified :   Identification band   Room Orientation/Facility Policy Reviewed :   Yes   Room Orientation/Facility Policy Reviewed With :   Patient   Freida BusmanLLEN, RN, RENEE L - 07/03/2016 23:20 EDT   Valuables/Belongings   Clothing Valuables Belongings Grid     Clothes, Patient Valuables          At Bedside :    1                ALLEN, RN, RENEE L - 07/03/2016 23:20 EDT

## 2016-07-03 NOTE — H&P (Signed)
 [facility]hab [facility]. Stanly, MD  Service Date: 07/03/2016    Post-admission evaluation completed at 4:00 p.m. on 07/03/2016.    ADMITTING PHYSICIAN:  Dr. Lynwood Stanly    REFERRING PHYSICIAN:  Dr. Ozell Shark    PRIMARY CARE PHYSICIAN:  None.    ADMITTING DIAGNOSES:  1.  Right bimalleolar ankle fracture on August 22nd.  2.  Status post open reduction internal fixation on August 23rd.  3.  History of gastric bypass surgery for obesity.  4.  Anemia.    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  She remained on the floor  overnight and called EMS the next morning who brought her to [facility], and she was admitted to Dr. Herbert service for  an open reduction internal fixation under general anesthesia.  She is  to keep her leg elevated for 48-72 hours after surgery and is to  remain nonweightbearing for 6 weeks.  She has been referred to  physical and occupational therapies and requires contact guard to come  from supine to sitting and minimal assistance to come from sitting to  standing and moderate assistance to transfer from bed to chair.  She  requires supervision for feeding, grooming, upper extremity dressing,  and minimal assistance for lower extremity dressing and bathing.  She  was able to walk only 4 feet with moderate assistance.  She has not  been on any DVT prophylaxis.  Her last bowel movement was on August  23rd.  She is voiding adequately, eating well, and sleeping well.  Her  pain is currently 6 out of 10 but is well controlled with Percocet.   As she lives alone and has minimal local social support and has a  flight of stairs to negotiate to her cottage at [facility], it was  recommended by her inpatient team that she be transferred for  intensive rehabilitation  to maximize her independence and her mobility  and self-care activities.    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  She  does not plan to return to her home on [address] and will have the  movers do all the packing and transporting her personal effects to  [place].  Unfortunately, her apartment in [place] that she has  chosen is a flat on the third floor, and she will likely stay in a  hotel in order to get to work.    PAST MEDICAL HISTORY:  Significant for gastric bypass surgery for  morbid obesity of 410 pounds for which she has learned to live with  the side effects, but occasionally has anemia.  She is on no  medications at home.    ALLERGIES:  PENICILLIN.    PAST SURGICAL HISTORY:  Significant for a cesarean section,  cholecystectomy, and the gastric bypass which was done in [place],  [place] .    SOCIAL HISTORY:  She was born in [place] as her father was medical  missionary.  She lived there until age 9 and then they moved to  [place]  after which they moved to [place] where she continued her  education.  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  She has 3 children, a  daughter who works for [employer] in [place], [place], another daughter  who is a physician in [place], [place] , and another daughter  who lives in [place] .  She denies alcohol or tobacco use.   Premorbidly, she was totally independent with basic and instrumental  activities of daily living including driving to and from [facility] to work.    FAMILY HISTORY:  Her father died at age 43 with pulmonary fibrosis  which was idiopathic, and her mother died at age 29 with endometrial  cancer.    REVIEW OF SYSTEMS:  She says that her  pain is adequately controlled  with Percocet as long as she gets it on time.  She had a bowel  movement today.  She is voiding adequately, eating well, sleeping  well, and coping as best as she can with the circumstances of her  life.    PHYSICAL EXAMINATION:  Temperature is 36.5 Celsius with pulse of 88,  respirations of 16, blood pressure 118/90, and O2 saturation is 97% on  room air.  Her weight is 75.5 kg, and her height is 160 cm.  She is an  alert, cooperative, very pleasant, fully-oriented lady who is somewhat  anxious but in no acute distress.  Her head is normocephalic.  Her  neck is supple.  There are no bruits and no JVD.  Her lungs are clear  to auscultation.  Her heart shows a regular rhythm.  Her abdomen is  obese.  There are positive bowel sounds.  The right lower extremity  has a cast in place below the knee.  She moves her toes well, and  there is no swelling or bruising noted.  She is able to raise her  right leg off the bed.  The left lower extremity has a well-healed  scar from a previous compound fracture of the lower leg, but active  movement in all muscle groups bilateral upper extremities have  functional range and strength.    ASSESSMENT:  Right bimalleolar fracture status post open reduction  internal fixation on August 23rd who is to be nonweightbearing on the  right lower extremity for possibly up to 6 weeks who is determined to  keep her appointment at her job in [place], [place]  on  September 5th who is appropriate for this level of rehabilitation and  who is medically appropriate to participate in the intensity of this  program.    PLAN:  She will be treated with an interdisciplinary team approach  consisting of physical and occupational therapy, social services,  rehab nursing, and physicians.  Physical and occupational therapy will  design programs that will increase her strength and stamina and safety  and confidences in order to perform dressing, bathing,  toileting,  transitional movements, household ambulation, and car transfers at a  modified independent to supervision level.  Rehabilitation nursing  will monitor bowel and bladder functioning and maintain skin  integrity.  Social services will assist her in discharge planning.  We  shall assess her on a daily basis ourselves to monitor for any postop  complications, and we will get lower extremity Doppler ultrasounds to  rule out any evidence of existing deep  vein thrombophlebitis.  There  are no discrepancies noted on the pre-admission evaluation tool that  was completed this morning, and she appears to have the motivation and  stamina to participate the requisite 3 hours of therapy 5 days a week.   We shall have weekly team conferences to upgrade her plan of care and  determine her length of stay, but she will likely benefit from 2 weeks  of inpatient rehabilitation to get to a level that will allow her to  travel to her new home in [place] .  Her prognosis for achieving  her goals is good, and barriers will be predominantly social and  architectural because of her nonweightbearing status.      Lynwood FORBES Bruce, MD  TR: *n DD: 07/03/2016 16:18 TD: 07/03/2016 18:10 Job#: [id number]  \[id number]#: [id number]  \[id number]    Signature Line     Electronically Signed on 07/05/2016 08:18 AM EDT   ________________________________________________   MARZETTE LYNWOOD FORBES               Modified by: MARZETTE LYNWOOD FORBES on 07/05/2016 08:18 AM EDT

## 2016-07-03 NOTE — Nursing Note (Signed)
Medication Administration Follow Up-Text       Medication Administration Follow Up Entered On:  07/03/2016 6:40 EDT    Performed On:  07/03/2016 4:20 EDT by Shirl Harris, RN, EDWIN L      Intervention Information:     acetaminophen-oxycodone  Performed by Shirl Harris, RN, EDWIN L on 07/03/2016 03:20:00 EDT       oxyCODONE-acetaminophen,10mg   Oral,moderate pain (4-7)       Medication Effectiveness Evaluation   Medication Administration Reason :   Pain   Medication Effective :   Yes   Medication Response :   Symptoms improved   TASSIN, RN, EDWIN L - 07/03/2016 6:40 EDT   Pain Assessment   Numeric Rating Pain Scale :   4   TASSIN, RN, EDWIN L - 07/03/2016 6:40 EDT   Image 4 -  Images currently included in the form version of this document have not been included in the text rendition version of the form.

## 2016-07-04 NOTE — Nursing Note (Signed)
Medication Administration Follow Up-Text       Medication Administration Follow Up Entered On:  07/04/2016 0:18 EDT    Performed On:  07/03/2016 21:35 EDT by Freida Busman, RN, RENEE L      Intervention Information:     acetaminophen-oxycodone  Performed by Freida Busman, RN, RENEE L on 07/03/2016 20:35:00 EDT       oxyCODONE-acetaminophen,2tabs  Oral,severe pain (8-10)       Medication Effectiveness Evaluation   Medication Administration Reason :   Pain   Medication Effective :   Yes   Medication Response :   Symptoms improved   ALLEN, RN, RENEE L - 07/04/2016 0:18 EDT

## 2016-07-04 NOTE — Progress Notes (Signed)
PT Inpatient Examination - Text       PT Inpatient Evaluation Entered On:  07/04/2016 17:55 EDT    Performed On:  07/04/2016 17:06 EDT by Jonette Mate, PT, KATHLEEN A               Reason for Treatment   Subjective Statement :   "I haven't been getting my pain meds on time"  Pt initially sitting in w/c and left in w/c  seatbelt/alarm secured, all needs in reachand in PM was initially in bed and left in bed w RLE elevated on pillow, alarm set, and all needs in reach.     *Reason for Referral :   R bimalleolar fx s/p ORIF and cast    PMH: gastric bypass, anemia    Precautions: NWB  on RLE, fall    schedule: 3 hr.     *Chief Complaint :   9/10 pain in R ankle in AM, 10/10 in PM (already asked but hadn't received meds)     MCGREEVY, PT, KATHLEEN A - 07/04/2016 17:06 EDT   General Information   Physical Therapy Orders :   PT FIMS - 07/03/16 15:22:00 EDT, Daily  PT Inpatient Evaluation and Treatment Rehab - 07/03/16 15:22:00 EDT, Stop date 07/03/16 15:22:00 EDT     Precautions RTF :   Precaution Orders  Fall Risk Precautions - Ordered    -- 07/03/16 15:22:00 EDT, Stop date 07/03/16 15:22:00 EDT       Orientation Assessment :   Oriented x 4   Affect/Behavior :   Anxious, Cooperative   Basic Command Following :   Intact   Safety/Judgment :   Intact   Pain Present :   Yes actual or suspected pain   Culture/Spiritual Beliefs to Incorporate :   No   MCGREEVY, PT, KATHLEEN A - 07/04/2016 17:06 EDT   Problem List   (As Of: 07/04/2016 17:55:52 EDT)   Problems(Active)    Alteration in comfort: pain (SNOMED CT  :16109604 )  Name of Problem:   Alteration in comfort: pain ; Recorder:   SYSTEM,  SYSTEM; Confirmation:   Confirmed ; Classification:   Interdisciplinary ; Code:   54098119 ; Last Updated:   07/02/2016 6:20 EDT ; Life Cycle Date:   07/02/2016 ; Life Cycle Status:   Active ; Vocabulary:   SNOMED CT   ; Comments:        07/02/2016 6:20 - SYSTEM,  SYSTEM  Problem added automatically by system based on initiation of Alteration in Comfort  Plan of Care      Anemia (SNOMED CT  :ACSYtQDsdrRUkXtoqf79/w )  Name of Problem:   Anemia ; Recorder:   CANADY, RN, BROOKE N; Confirmation:   Confirmed ; Classification:   Medical ; Code:   ACSYtQDsdrRUkXtoqf79/w ; Contributor System:   Dietitian ; Last Updated:   05/19/2016 7:01 EDT ; Life Cycle Date:   05/19/2016 ; Life Cycle Status:   Active ; Vocabulary:   SNOMED CT        At risk for infection (SNOMED CT  :147829562 )  Name of Problem:   At risk for infection ; Recorder:   SYSTEM,  SYSTEM; Confirmation:   Confirmed ; Classification:   Interdisciplinary ; Code:   130865784 ; Last Updated:   07/03/2016 18:47 EDT ; Life Cycle Date:   07/03/2016 ; Life Cycle Status:   Active ; Vocabulary:   SNOMED CT   ; Comments:        07/03/2016 18:47 - SYSTEM,  SYSTEM  Problem added automatically by system based on initiation of Risk For Infection Plan of Care      Bowel dysfunction (SNOMED CT  :098119147 )  Name of Problem:   Bowel dysfunction ; Recorder:   SYSTEM,  SYSTEM; Confirmation:   Confirmed ; Classification:   Interdisciplinary ; Code:   829562130 ; Last Updated:   07/03/2016 18:47 EDT ; Life Cycle Date:   07/03/2016 ; Life Cycle Status:   Active ; Vocabulary:   SNOMED CT   ; Comments:        07/03/2016 18:47 - SYSTEM,  SYSTEM  Problem added automatically by system based on initiation of Bowel Dysfunction Plan of Care      Impaired tissue integrity (SNOMED CT  :86578469 )  Name of Problem:   Impaired tissue integrity ; Recorder:   SYSTEM,  SYSTEM; Confirmation:   Confirmed ; Classification:   Interdisciplinary ; Code:   62952841 ; Last Updated:   07/02/2016 6:20 EDT ; Life Cycle Date:   07/02/2016 ; Life Cycle Status:   Active ; Vocabulary:   SNOMED CT   ; Comments:        07/02/2016 6:20 - SYSTEM,  SYSTEM  Problem added automatically by system based on initiation of Impaired Tissue Integrity Plan of Care        Diagnoses(Active)    Anemia  Date:   07/03/2016 ; Diagnosis Type:   Discharge ; Confirmation:   Confirmed ; Clinical  Dx:   Anemia ; Classification:   Medical ; Code:   ICD-10-CM ; Probability:   0 ; Diagnosis Code:   D64.9      Closed bimalleolar fracture  Date:   07/03/2016 ; Diagnosis Type:   Discharge ; Confirmation:   Confirmed ; Clinical Dx:   Closed bimalleolar fracture ; Classification:   Medical ; Code:   ICD-10-CM ; Probability:   0 ; Diagnosis Code:   L24.401U      History of gastric bypass  Date:   07/03/2016 ; Diagnosis Type:   Discharge ; Confirmation:   Confirmed ; Clinical Dx:   History of gastric bypass ; Classification:   Medical ; Code:   ICD-10-CM ; Probability:   0 ; Diagnosis Code:   Z98.890      S/P ORIF (open reduction internal fixation) fracture  Date:   07/03/2016 ; Diagnosis Type:   Discharge ; Confirmation:   Confirmed ; Clinical Dx:   S/P ORIF (open reduction internal fixation) fracture ; Classification:   Medical ; Code:   ICD-10-CM ; Probability:   0 ; Diagnosis Code:   Z96.7        Pain Assessment   Pain Location :   Ankle   Laterality :   Right   Quality :   Throbbing   Time Pattern :   Constant   Pain Negatively Impacts :   Concentration, Daily life, Emotions, Sleep, Work   Self Report Pain :   Numeric rating scale   Numeric Pain Scale :   9   Numeric Pain Score :   9    MCGREEVY, PT, KATHLEEN A - 07/04/2016 17:06 EDT   Home Environment   Living Environment :   Home Environment  *ADL:  Independent  Performed By:  Wyonia Hough, ANN K 07/04/2016  *Cognitive-Communication Skills:  Independent  Performed By:  Wyonia Hough, ANN K 07/04/2016  *Instrumental ADL:  Independent  Performed By:  Wyonia Hough, ANN K 07/04/2016  *Mobility:  Independent  Performed  By:  Eliseo Gum, OT, ANN K 07/04/2016  Kitchen:  1st floor  Performed By:  Wyonia Hough, ANN K 07/04/2016  Laundry:  1st floor  Performed By:  Wyonia Hough, ANN K 07/04/2016  Lives In:  Single level home  Performed By:  Wyonia Hough, ANN K 07/04/2016  Lives With:  Alone  Performed By:  Wyonia Hough, ANN K 07/04/2016  Living Situation:  Home independently  Performed By:   Wyonia Hough, ANN K 07/04/2016  Number of Stairs Outside:  15  Performed By:  Wyonia Hough, ANN K 07/04/2016  Patient's Responsibilities:  Driver, Employed, Health and wellness, 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, Laundry, Manage Medications, Meal preparation, Personal ADL, Retired, Proofreader By:  Eliseo Gum, OT, ANN K 07/04/2016  Primary Bathroom:  1st floor  Performed By:  Wyonia Hough, ANN K 07/04/2016  Primary Bedroom:  1st floor  Performed By:  Wyonia Hough, ANN K 07/04/2016  Anticipated Need for Home Modification:  No  Performed By:  Alison Murray B 07/03/2016     Living Situation :   Home independently   Lives With :   Alone   Lives In :   Single level home   Anticipated   Need For Home   Modification :   No   MCGREEVY, PT, KATHLEEN A - 07/04/2016 17:06 EDT   Stairs     Outside Stairs          Number of Stairs :    15               Rail :    Rail on right going up                [place], PT, KATHLEEN A - 07/04/2016 17:06 EDT         Home Setup   Primary Bedroom :   1st floor   Primary Bathroom :   1st floor   Kitchen :   1st floor   Laundry :   1st floor   [place], PT, KATHLEEN A - 07/04/2016 17:06 EDT   Patient's Responsibilities :   Driver, Employed, Health and wellness, 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   MCGREEVY, PT, KATHLEEN A - 07/04/2016 17:06 EDT   LE ROM/Strength   LE Overall Range of Motion Grid   Left Lower Extremity Active Range :   Within functional limits   Left Lower Extremity Passive Range :   Within functional limits   Right Lower Extremity Active Range :   ankle in cast @ 0 deg DF   Right Lower Extremity Passive Range :    "   "   "   MCGREEVY, PT, KATHLEEN A - 07/04/2016 17:06 EDT    Lt Lower Extremity Strength :   Within functional limits   Rt Lower Extremity Strength :   Other: WFL except ankle immobilized in cast (able to SLR in cast)   MCGREEVY, PT, KATHLEEN A - 07/04/2016 17:06 EDT   LE/Trunk Tone   Left Lower Extremity Tone   Left Lower Extremity :   Normal   Right Lower Extremity :   Normal   Trunk :  Normal   MCGREEVY, PT, KATHLEEN A - 07/04/2016 17:06 EDT   UE ROM/Strength   Upper Extremity Overall ROM Grid   Left Upper Extremity Active Range :   Within functional limits   Left Upper Extremity Passive Range :   Within functional limits   Right Upper Extremity Active Range :   Within functional limits   Right Upper Extremity Passive Range :   Within functional limits   MCGREEVY, PT, KATHLEEN A - 07/04/2016 17:06 EDT   Lt Upper Extremity Strength :   Within functional limits   MCGREEVY, PT, KATHLEEN A - 07/04/2016 17:06 EDT   Left Upper Extremity Strength Grid   Shoulder Flexion :   4+   Shoulder Abduction :   4+   Elbow Flexion :   4+   Elbow Extension :   4   Finger Flexion :   4+   MCGREEVY, PT, KATHLEEN A - 07/04/2016 17:06 EDT   Rt Upper Extremity Strength :   Within functional limits   MCGREEVY, PT, KATHLEEN A - 07/04/2016 17:06 EDT   Right Upper Extremity Strength Grid   Shoulder Flexion :   4+   Shoulder Abduction :   4+   Elbow Flexion :   4+   Elbow Extension :   4   Finger Flexion :   4+   MCGREEVY, PT, KATHLEEN A - 07/04/2016 17:06 EDT   UE Tone         remainder (less than 1/2 of ROM)   Left Upper Extremity :   Normal   Right Upper Extremity :   Normal   MCGREEVY, PT, KATHLEEN A - 07/04/2016 17:06 EDT   UE Coordination   Mean Age Gender Lt 9 Hole Peg Test :   19.48   Right Mean for Age/Gender :   17.86   MCGREEVY, PT, KATHLEEN A - 07/04/2016 17:06 EDT   Left Upper Extremity Coordination Grid   Diadochokinesia :   Within functional limits   Finger to Nose :   Within functional limits   Finger Opposition :   Within functional limits   MCGREEVY, PT, KATHLEEN A - 07/04/2016 17:06 EDT    Right Upper Extremty Coordination Grid   Diadochokinesia :   Within functional limits   Finger to Nose :   Within functional limits   Finger Opposition :   Within functional limits   MCGREEVY, PT, KATHLEEN A - 07/04/2016 17:06 EDT   UE Function   Left Grip Strength Trial 1 :   560 lb   Left Grip Strength Trial 2 :   60 lb   Left Grip Strength Trial 3 :   60 lb   Left Hand Grip Strength :   226.7 lb   Mean for Age,Gender Left Hand Grip :   47.3 lbs   Right Grip Strength Trial 1 :   55 lb   Right Grip Strength Trial 2 :   65 lb   Right Grip Strength Trial 3 :   65 lb   Right Hand Grip Strength :   61.7 lb   Mean for Age,Gender Right Hand Grip :   57.3 lbs   MCGREEVY, PT, KATHLEEN A - 07/04/2016 17:06 EDT   Upper Extremity Functional Scale Grid   Left :   Functional   Right :   Functional   MCGREEVY, PT, KATHLEEN A - 07/04/2016 17:06 EDT   Sensation   Left Upper Extremity Sensation   Light Touch :  Intact   Proprioception :   Intact   MCGREEVY, PT, KATHLEEN A - 07/04/2016 17:06 EDT   Right Upper Extremity Sensation   Light Touch :   Intact   Proprioception :   Intact   MCGREEVY, PT, KATHLEEN A - 07/04/2016 17:06 EDT   Left Lower Extremity Sensation   Light Touch :   Intact   Proprioception :   Intact   MCGREEVY, PT, KATHLEEN A - 07/04/2016 17:06 EDT   Right Lower Extremity Sensation   Light Touch :   Intact   Proprioception :   Intact   MCGREEVY, PT, KATHLEEN A - 07/04/2016 17:06 EDT   LE Coordination   Trace Square Left Lower Extremity   Toe Taps :   Within functional limits   Heel to [place] :   Within functional limits   MCGREEVY, PT, KATHLEEN A - 07/04/2016 17:06 EDT   Right Lower Extremity Coordination   Toe Taps :   Impaired, able to wiggle toes but ankle immobilized in cast   Heel to Shin :   Within functional limits   MCGREEVY, PT, KATHLEEN A - 07/04/2016 17:06 EDT   Sitting Balance   Static Sitting Balance Assessment Grid   Sits Without UE Support :   Rehab Modified independence   Sits With One UE Support :   Rehab  Complete independence   Sits With Two UE Support :   Rehab Complete independence   MCGREEVY, PT, KATHLEEN A - 07/04/2016 17:06 EDT   Sitting Surface Evaluated Upon :   Bed   MCGREEVY, PT, KATHLEEN A - 07/04/2016 17:06 EDT   Dynamic Sitting Balance Assessment Grid   Posterior Shift :   Able   Lateral to the Left Shift :   Able   Lateral to the Right Shift :   Able   MCGREEVY, PT, KATHLEEN A - 07/04/2016 17:06 EDT   Functional Impact :   Reaching forward slightly difficult with non wt bearing status.   MCGREEVY, PT, KATHLEEN A - 07/04/2016 17:06 EDT   Standing Balance   Static Standing Balance   Stands Without UE Support :   Does not occur   Stands with One UE Support :   Close supervision   Stands with Two UE Support :   Close supervision   MCGREEVY, PT, KATHLEEN A - 07/04/2016 17:06 EDT   Standing Surface Evaluated Upon :   Floor   Patient Accepts Pertubations :   No   MCGREEVY, PT, KATHLEEN A - 07/04/2016 17:06 EDT   PT Mobility   Mobility Grid   Roll Left :   Rehab Modified independence   Roll Right :   Rehab Modified independence   Roll Supine :   Rehab Modified independence   Supine to Sit :   Rehab Modified independence   Sit to Supine :   Rehab Modified independence   Scooting :   Rehab Modified independence   Sit to Stand :   Rehab Minimal assistance   Stand to Sit :   Rehab Minimal assistance   Transfer Bed to and From Chair :   Close supervision   Transfer Toilet :   Minimal contact assistance   MCGREEVY, PT, KATHLEEN A - 07/04/2016 17:06 EDT   Functional Mobility Details :   Pt c/o blurred vision in PM, unable to assess gait w crutches (nurse notified)   MCGREEVY, PT, KATHLEEN A - 07/04/2016 17:06 EDT   Amb Ability Varied Surf/Distraction Grid   Level  Surfaces :   Rehab Minimal assistance   Uneven Surfaces :   Does not occur   Distracting Environments :   Does not occur   Curbs :   Does not occur   Stairs :   Does not occur   Ramp :   Does not occur   MCGREEVY, PT, KATHLEEN A - 07/04/2016 17:06 EDT   Transfer Type  :   Squat pivot   Ambulation Device Utilized :   Gait belt, Parallel bars   Distance Level Surface :   10 ft   Other Mobility Training :   rolling stool supporting RLE in // bars   PT Mobility Reviewed :   Yes   MCGREEVY, PT, KATHLEEN A - 07/04/2016 17:06 EDT   PT WC Management   Type of Wheelchair :   Manual wheelchair   Ivor Messier A - 07/04/2016 17:06 EDT   Wheelchair Mobility Grid   Level Surfaces :   Rehab Modified independence   MCGREEVY, PT, KATHLEEN A - 07/04/2016 17:06 EDT   Wheelchair Mobility Level Distance :   150 ft   Wheelchair Mobility Reviewed :   Yes   MCGREEVY, PT, KATHLEEN A - 07/04/2016 17:06 EDT   Vital Signs   Peripheral Pulse Rate :   76 bpm   Systolic/ Diastolic  BP :   564 mmHg   Diastolic Blood Pressure :   79 mmHg   O2 Saturation :   99 %   Additional Information :   VS taken in PM when pt c/o blurred vision.  She stated BP of 124/79 was high for her, reported to nurse.   MCGREEVY, PT, KATHLEEN A - 07/04/2016 17:06 EDT   DME   PT Equipment Anticipated or Recommended :   Crutches, Other: rolling knee walker   Additional Comments DME PT :   [facility], they will deliver rolling knee walker Monday if called and stated "needs today".  [facility] has none in stock. Pt wants the knee rolling walker for work and axillary crutches for use at home (and stairs if necessary).   MCGREEVY, PT, KATHLEEN A - 07/04/2016 17:06 EDT   Assessment   PT Impairments or Limitations :   Ambulation deficits, Endurance deficits, Equipment training, Home accessibility/housing, Pain limiting function, Transfer deficits   Barriers to Safe Discharge PT :   Limited family support, Limited social support, Severity of deficits   Discharge Recommendations :   Pt has new job awaiting her in [place], [place] in 10 days, will be living in 3rd floor apt!  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  She is very anxious about keeping this new job and should handle a rolling knee walker fine at work, will need axillary crutches at home (has 2 flights of stairs to apt), is willing to sit and "bump up" stairs on buttocks if necessary.  She needs training on rolling knee walker ASAP [employer] will deliver Monday if called); unable to try gait training w axillary crutches today 2* c/o blurred vision this PM (pt is VERY anxious). Good potential to meet goals in 5 days.     Ivor Messier A - 07/04/2016 17:06 EDT   Short Term Goals  Bed Mobility Goal Grid     Goal #1          Descriptors :    Roll to right and left, Sit to supine, Supine to sit              Level :    Modified independence              Status :    Goal met              Date Met :    07/04/2016 EDT                MCGREEVY, PT, KATHLEEN A - 07/04/2016 17:06 EDT         Transfers Goal Grid     Goal #1  Goal #2        Descriptors :    Squat pivot transfer, Other: NWB on RLE   Sit to stand, Stand to sit           Level :    Close supervision   Minimal assistance           Device :       Axillary crutches           Status :    Goal met   Goal met           Date Met :    07/04/2016 EDT   07/04/2016 EDT             MCGREEVY, PT, KATHLEEN A - 07/04/2016 17:06 EDT  MCGREEVY, PT, KATHLEEN A - 07/04/2016 17:06 EDT        W/C Management Grid     Goal #1          Descriptors :    Manual wheelchair propulsion indoors              Level :    Modified independence              Distance :    150 ft              Status :    Goal met              Date Met :    07/04/2016 EDT                MCGREEVY, PT, Nicholos Johns A - 07/04/2016 17:06 EDT         PT ST Goals Reviewed :   Linton Flemings, PT, KATHLEEN A - 07/04/2016 17:06 EDT   Long Term Goals   PT Patient,Caregiver Goal :   Be able to go to my new job in [place], [place] in 10 days.   MCGREEVY, PT, KATHLEEN A - 07/04/2016 17:06 EDT   Mobility Goals Grid    Bed, Chair, Wheelchair Goal :   Modified independence   Toilet Transfer Goal :   Modified independence   Ambulation Level Surfaces Goal :   Modified independence   Wheelchair Mobility Level Surfaces Goal :   Modified independence   Stairs Ambulation Assistance Level Goal :   Modified independence   MCGREEVY, PT, KATHLEEN A - 07/04/2016 17:06 EDT   PT LTG Reconcilation :   Ambulation NWB on RLE w both rolling knee walker and axillary crutches.   Type of Wheelchair Goal :   Manual wheelchair   Mode of Locomotion Goal :   Walk   PT LT Goals Reviewed :  Yes   MCGREEVY, PT, KATHLEEN A - 07/04/2016 17:06 EDT   Plan   Frequency :   Mo/Tu/We/Th/Fr/Sa   Duration :   1    PT Duration Unit Rehab :   Weeks   Treatments Planned :   Balance training, Equipment training, Functional training, Gait training, Pain management, Patient education, Stair training, Therapeutic activities, Therapeutic exercises, Transfer training, Wheelchair assessment and management   Treatment Plan/Goals Established With Patient/Caregiver :   Yes   Other PT Treatment Provided :   Called DME re rolling knee walker   Evaluation Complete :   Yes   MCGREEVY, PT, KATHLEEN A - 07/04/2016 17:06 EDT   Time Spent With Patient   PT Individual Eval Time, Low Complexity :   15 minutes   PT Time In :   10:00 EST   PT Time Out :   10:30 EST   PT Evaluation Units, Low Complexity :   1 Unit   PT Time In 2 :   14:30 EST   PT Time Out 2 :   15:30 EST   PT Therapeutic Exercise Units :   1 units   PT Therapeutic Exercise Time :   15 minutes   PT Gait Training Units :   1 units   PT Gait Training Time :   15 minutes   PT ADL TRAINING 15 MN :   2 units   PT ADL Training Time :   30 minutes   PT Wheelchair Management Units :   1 units   PT Wheelchair Management Time :   15 minutes   PT Total Individual Therapy Time :   90 minutes   PT Total Timed Code Treatment Units :   5 units   PT Total Timed Code Tx Minutes :   75 minutes   PT Total Untimed Code Treatment Minutes :   15  minutes   PT Total Treatment Time Rehab :   90 minutes   MCGREEVY, PT, KATHLEEN A - 07/04/2016 17:06 EDT   Care Tool Section GG: Admission Mobility Functional Abilities   GG0170 Roll Left and Right :   Independent - Patient/Resident completes the activities by him/herself, with or without an assistive device, with no assistance from a helper. - 06   GG0170 Sit to Lying :   Independent - Patient/Resident completes the activities by him/herself, with or without an assistive device, with no assistance from a helper. - 06   GG0170 Lying to Sitting on Side of Bed :   Independent - Patient/Resident completes the activities by him/herself, with or without an assistive device, with no assistance from a helper. - 06   GG0170 Sit to Stand :   Supervision or touching assistance - Helper provides VERBAL CUES or TOUCHING/STEADYING assistance as patient/resident completes activity. Assistance may be provided throughout the activity or intermittently. - 04   GG0170 Chair,Bed to Chair Transfer :   Supervision or touching assistance - Helper provides VERBAL CUES or TOUCHING/STEADYING assistance as patient/resident completes activity. Assistance may be provided throughout the activity or intermittently. - 04   GG0170 Car Transfer :   Not attempted due to medical condition or safety concerns - 88   GG0170 Patient Walk :   No, and walking goal IS clinically indicated   MCGREEVY, PT, KATHLEEN A - 07/04/2016 17:06 EDT   Care Tool Section GG: Mobility Continued   GG0170 Patient Use Wheelchair,Scooter :   Yes   GG0170 Wheel 50 Feet with  Two Turns :   Independent - Patient/Resident completes the activities by him/herself, with or without an assistive device, with no assistance from a helper. - 06   GG0170 Type Wheelchair,Scooter Use 52ft :   Manual wheelchair   GG0170 Wheel 150 feet :   Independent - Patient/Resident completes the activities by him/herself, with or without an assistive device, with no assistance from a helper. - 06   GG0170  Type Wheelchair,Scooter Use 175ft :   Manual wheelchair   MCGREEVY, PT, KATHLEEN A - 07/04/2016 17:06 EDT   Care Tool Section GG: Mobility Goals   Mobility Goal Grid   GG0170 Roll Left and Right Goal :   Independent - Patient/Resident completes the activities by him/herself, with or without an assistive device, with no assistance from a helper. - 06   GG0170 Sit to Lying Goal :   Independent - Patient/Resident completes the activities by him/herself, with or without an assistive device, with no assistance from a helper. - 06   GG0170 Lying to Sitting Side of Bed Goal :   Independent - Patient/Resident completes the activities by him/herself, with or without an assistive device, with no assistance from a helper. - 06   GG0170 Sit to Stand Goal :   Supervision or touching assistance - Helper provides VERBAL CUES or TOUCHING/STEADYING assistance as patient/resident completes activity. Assistance may be provided throughout the activity or intermittently. - 04   GG0170 Chair,Bed to Chair Transfer Goal :   Setup or clean-up assistance - Helper SETS UP or CLEANS UP; Patient/Resident completes activity. Helper assists only prior to or following the activity. - 05   MCGREEVY, PT, KATHLEEN A - 07/04/2016 17:06 EDT   Care Tool Section GG: Walk/Wheelchair Goals   GG Mobility Goals Grid   GG0170 Walk 10 Feet Goal :   Independent - Patient/Resident completes the activities by him/herself, with or without an assistive device, with no assistance from a helper. - 06   GG0170 Walk 50 Feet with Two Turns Goal :   Independent - Patient/Resident completes the activities by him/herself, with or without an assistive device, with no assistance from a helper. - 06   GG0170 Walk 10 Ft Uneven Surface Goal :   Independent - Patient/Resident completes the activities by him/herself, with or without an assistive device, with no assistance from a helper. - 06   GG0170 1 Step (Curb) Goal :   Independent - Patient/Resident completes the activities by  him/herself, with or without an assistive device, with no assistance from a helper. - 06   GG0170 4 Steps Goal :   Independent - Patient/Resident completes the activities by him/herself, with or without an assistive device, with no assistance from a helper. - 06   GG0170 12 Steps Goal :   Independent - Patient/Resident completes the activities by him/herself, with or without an assistive device, with no assistance from a helper. - 06   [id number] Picking Up Object Goal :   Independent - Patient/Resident completes the activities by him/herself, with or without an assistive device, with no assistance from a helper. - 06   MCGREEVY, PT, KATHLEEN A - 07/04/2016 17:06 EDT

## 2016-07-04 NOTE — Progress Notes (Signed)
Functional Indep Measure Scores - Text       Functional Independence Measure Scores Entered On:  07/04/2016 17:06 EDT    Performed On:  07/04/2016 17:02 EDT by Jonette MateMCGREEVY, PT, KATHLEEN A               Transfer Bed/Chair/WC Score   Patient's independence Level with Bed, Chair, Wheelchair Tasks :   Assistance   Type of Assistance Necessary :   Incidental help for contact guard or steadying   Bed, Chair, Wheelchair Transfer :   Minimal contact assistance   MCGREEVY, PT, Nicholos JohnsKATHLEEN A - 07/04/2016 17:02 EDT   Walk/Wheelchair Score   Mode of Locomotion Goal :   Both   Type of Wheelchair Goal :   Manual wheelchair   Mode of Locomotion on Discharge :   Walk   Patient Ambulates :   Patient ambulates   Distance Ambulated :   10 ft   Amount of Assistance Necessary :   No more help than touching (patient performs 75% or more of tasks)   Ambulation Device Utilized :   Gait belt, Parallel bars   Functional Independence Measure Walk :   Total assistance   MCGREEVY, PT, KATHLEEN A - 07/04/2016 17:02 EDT   Comprehension Score   Mode of Comprehension :   Auditory   Comprehends Complex or Abstract Information Without Prompting or Cueing :   Yes   Understands Complex or Abstract Directions and Conversations :   Mild difficulty   Comprehension Indep Measure Interim :   Modified independence   MCGREEVY, PT, KATHLEEN A - 07/04/2016 17:02 EDT   Expression Score   Expression Mode :   Vocal   Expresses Complex or Abstract Information Without Prompting or Cueing :   Yes   Expresses Complex or Abstract Ideas :   Clearly and fluently at all times   Expression Indep Measure Interim :   Complete independence   MCGREEVY, PT, KATHLEEN A - 07/04/2016 17:02 EDT   Social Interaction Score   Interacts Appropriately Without Supervision :   Yes   Interacts Appropriately :   Most of the time and only occasionally loses control   Social Interaction Indep Measure Interim :   Modified independence   MCGREEVY, PT, KATHLEEN A - 07/04/2016 17:02 EDT   Problem Solving  Score   Solves Complex Problems :   No   Ability to Solve Routine Problems :   Standby, 90 percent, requires prompting 10 percent or less   Problem Solving Indep Measure Interim :   Standby prompting   MCGREEVY, PT, KATHLEEN A - 07/04/2016 17:02 EDT   Memory Score   Recognizes, Remembers Routines, and Executes Requests Without Prompting :   Yes   Remembers and Executes Requests :   Consistently without need for repetition   Memory Indep Measure Interim :   Complete independence   MCGREEVY, PT, KATHLEEN A - 07/04/2016 17:02 EDT   24 Hour Cognition Scores   Comprehension Indep Measure :   Modified independence   Expression Indep Measure :   Complete independence   Social Interaction Indep Measure :   Modified independence   Problem Solving Indep Measure :   Standby prompting   Memory Indep Measure :   Complete independence   MCGREEVY, PT, KATHLEEN A - 07/04/2016 17:02 EDT

## 2016-07-04 NOTE — Progress Notes (Signed)
Functional Indep Measure Scores - Text       Functional Independence Measure Scores Entered On:  07/04/2016 12:31 EDT    Performed On:  07/04/2016 12:29 EDT by Eliseo GumBENTON, OT, ANN K               Eating Score   Patient's Independence Level With Eating Tasks :   Independent/Modified independence   Independent or Modifications Needed :   Requires dentures to eat   Functional Independence Measure Eating :   Modified independence   BENTON, OT, ANN K - 07/04/2016 12:29 EDT   Grooming Score   Patient's Independence Level with Grooming Tasks :   Setup/Supervision   Supervision or Setup Needed :   English as a second language teacherGather equipment   Grooming :   Supervision or setup   BENTON, OT, ANN K - 07/04/2016 12:29 EDT   Bathing Score   Patient's Independence Level with Bathing Tasks :   Does not occur   Functional Independence Measure Bathing :   Does not occur   BENTON, OT, ANN K - 07/04/2016 12:29 EDT   Upper Body Dressing Score   Patient's Independence Level with Upper Body Dressing Tasks :   Setup/Supervision   Supervision or Setup Needed :   Gather clothes   UE Dressing :   Supervision or setup   BENTON, OT, ANN K - 07/04/2016 12:29 EDT   Lower Body Dressing Score   Patient's independence Level with Lower Body Dressing Tasks :   Assistance   Type of Assistance Necessary :   Incidental help for contact guard or steadying   LE Dressing :   Minimal contact assistance   BENTON, OT, ANN K - 07/04/2016 12:29 EDT   Toileting Score   Patient's Independence Level with Toileting Tasks :   Assistance   Type of Assistance Necessary :   Incidental help for contact guard or steadying   Toileting :   Minimal contact assistance   BENTON, OT, ANN K - 07/04/2016 12:29 EDT   Transfer Bed/Chair/WC Score   Patient's independence Level with Bed, Chair, Wheelchair Tasks :   Assistance   Type of Assistance Necessary :   No more help than touching (patient performs 75% or more of tasks)   Bed, Chair, Wheelchair Transfer :   Minimal contact assistance   BENTON, OT, ANN K - 07/04/2016  12:29 EDT   Transfer Toilet Score   Patient's Independence Level with Transfer Toilet Tasks :   Assistance   Amount of Assistance Necessary :   No more help than touching (patient performs 75% or more of tasks)   Transfer Toilet :   Minimal contact assistance   BENTON, OT, ANN K - 07/04/2016 12:29 EDT   Comprehension Score   Mode of Comprehension :   Auditory   Comprehends Complex or Abstract Information Without Prompting or Cueing :   Yes   Understands Complex or Abstract Directions and Conversations :   Mild difficulty   Comprehension Indep Measure Interim :   Modified independence   BENTON, OT, ANN K - 07/04/2016 12:29 EDT   Expression Score   Expression Mode :   Vocal   Expresses Complex or Abstract Information Without Prompting or Cueing :   Yes   Expresses Complex or Abstract Ideas :   Clearly and fluently at all times   Expression Indep Measure Interim :   Complete independence   BENTON, OT, ANN K - 07/04/2016 12:29 EDT   Social Interaction Score   Interacts  Appropriately Without Supervision :   Yes   Interacts Appropriately :   Requires more than reasonable time to make decisions   Social Interaction Indep Measure Interim :   Modified independence   BENTON, OT, ANN K - 07/04/2016 12:29 EDT   Problem Solving Score   Solves Complex Problems :   No   Ability to Solve Routine Problems :   Standby, 90 percent, requires prompting 10 percent or less   Problem Solving Indep Measure Interim :   Standby prompting   BENTON, OT, ANN K - 07/04/2016 12:29 EDT   Memory Score   Recognizes, Remembers Routines, and Executes Requests Without Prompting :   Yes   Remembers and Executes Requests :   Consistently without need for repetition   Memory Indep Measure Interim :   Complete independence   BENTON, OT, ANN K - 07/04/2016 12:29 EDT

## 2016-07-04 NOTE — Nursing Note (Signed)
Medication Administration Follow Up-Text       Medication Administration Follow Up Entered On:  07/04/2016 21:57 EDT    Performed On:  07/04/2016 20:33 EDT by Jovita KussmaulGADSDEN, RN, ARIEL L      Intervention Information:     acetaminophen-oxycodone  Performed by Lala LundMEWSHAW, RN, SARAH J-RN on 07/04/2016 19:33:00 EDT       oxyCODONE-acetaminophen,2tabs  Oral,severe pain (8-10)       Medication Effectiveness Evaluation   Medication Administration Reason :   Pain   Medication Effective :   Yes   Medication Response :   Symptoms improved, Continue to observe for symptoms   GADSDEN, RN, ARIEL L - 07/04/2016 21:56 EDT

## 2016-07-04 NOTE — Nursing Note (Signed)
Medication Administration Follow Up-Text       Medication Administration Follow Up Entered On:  07/04/2016 5:22 EDT    Performed On:  07/04/2016 1:21 EDT by Freida BusmanALLEN, RN, RENEE L      Intervention Information:     acetaminophen-oxycodone  Performed by Freida BusmanALLEN, RN, RENEE L on 07/04/2016 00:21:00 EDT       oxyCODONE-acetaminophen,2tabs  Oral,severe pain (8-10)       Medication Effectiveness Evaluation   Medication Administration Reason :   Pain   Medication Effective :   Yes   Medication Response :   Symptoms improved   ALLEN, RN, RENEE L - 07/04/2016 5:22 EDT

## 2016-07-04 NOTE — Progress Notes (Signed)
 OT Inpatient Evaluation - Text       OT Inpatient Evaluation Entered On:  07/04/2016 11:19 EDT    Performed On:  07/04/2016 11:09 EDT by LORELLA, OT, ANN K               Reason for Treatment   *Reason for Referral :   ADMITTING DIAGNOSES:  1.  Right bimalleolar ankle fracture on August 22nd.  2.  Status post open reduction internal fixation on August 23rd.  3.  History of gastric bypass surgery for obesity.  4.  Anemia    Patient had fall at home and did not get help for 12 hrs. Had surgery and is non wt bearing right ankle. Pt lives by self and is moving to [place] and needs to be mod I to start new job.          3 hr rule  -   NON WT BEARING RIGHT LE  -  FALL RISK     BENTON, OT, ANN K - 07/04/2016 14:15 EDT   *Chief Complaint :   Decreased mobility secondary to non wt bearing on right LE.  Impacts transfers, ADLs including dressing, bathing, toileting.        Subjective Statement :   I'm supposed to start a new job on [employer] and I have to be able to get around  - I've waited for this job.        BENTON, OT, ANN K - 07/04/2016 12:02 EDT   General Information   Occupational Therapy Orders :   Occupational Therapy Evaluation and Treatment Inpatient Acute - 07/02/16 11:26:00 EDT, Soon, Stop date 07/02/16 11:26:00 EDT     Precautions RTF :   Precaution Orders  Fall Risk Precautions - Ordered    -- 07/03/16 15:22:00 EDT, Stop date 07/03/16 15:22:00 EDT       Pain Present :   Yes actual or suspected pain   Orientation Assessment :   Oriented x 4   Culture/Spiritual Beliefs to Incorporate :   No   BENTON, OT, ANN K - 07/04/2016 11:09 EDT   Problem List   (As Of: 07/04/2016 12:29:16 EDT)   Problems(Active)    Alteration in comfort: pain (SNOMED CT  :65759985 )  Name of Problem:   Alteration in comfort: pain ; Recorder:   SYSTEM,  SYSTEM; Confirmation:   Confirmed ; Classification:   Interdisciplinary ; Code:   65759985 ; Last Updated:   07/02/2016 6:20 EDT ; Life Cycle Date:   07/02/2016 ; Life Cycle Status:   Active ;  Vocabulary:   SNOMED CT   ; Comments:        07/02/2016 6:20 - SYSTEM,  SYSTEM  Problem added automatically by system based on initiation of Alteration in Comfort Plan of Care      Anemia (SNOMED CT  :ACSYtQDsdrRUkXtoqf79/w )  Name of Problem:   Anemia ; Recorder:   CANADY, RN, BROOKE N; Confirmation:   Confirmed ; Classification:   Medical ; Code:   ACSYtQDsdrRUkXtoqf79/w ; Contributor System:   PowerChart ; Last Updated:   05/19/2016 7:01 EDT ; Life Cycle Date:   05/19/2016 ; Life Cycle Status:   Active ; Vocabulary:   SNOMED CT        At risk for infection (SNOMED CT  :786229980 )  Name of Problem:   At risk for infection ; Recorder:   SYSTEM,  SYSTEM; Confirmation:   Confirmed ; Classification:   Interdisciplinary ; Code:  786229980 ; Last Updated:   07/03/2016 18:47 EDT ; Life Cycle Date:   07/03/2016 ; Life Cycle Status:   Active ; Vocabulary:   SNOMED CT   ; Comments:        07/03/2016 18:47 - SYSTEM,  SYSTEM  Problem added automatically by system based on initiation of Risk For Infection Plan of Care      Bowel dysfunction (SNOMED CT  :646865986 )  Name of Problem:   Bowel dysfunction ; Recorder:   SYSTEM,  SYSTEM; Confirmation:   Confirmed ; Classification:   Interdisciplinary ; Code:   646865986 ; Last Updated:   07/03/2016 18:47 EDT ; Life Cycle Date:   07/03/2016 ; Life Cycle Status:   Active ; Vocabulary:   SNOMED CT   ; Comments:        07/03/2016 18:47 - SYSTEM,  SYSTEM  Problem added automatically by system based on initiation of Bowel Dysfunction Plan of Care      Impaired tissue integrity (SNOMED CT  :18596984 )  Name of Problem:   Impaired tissue integrity ; Recorder:   SYSTEM,  SYSTEM; Confirmation:   Confirmed ; Classification:   Interdisciplinary ; Code:   18596984 ; Last Updated:   07/02/2016 6:20 EDT ; Life Cycle Date:   07/02/2016 ; Life Cycle Status:   Active ; Vocabulary:   SNOMED CT   ; Comments:        07/02/2016 6:20 - SYSTEM,  SYSTEM  Problem added automatically by system based on initiation of  Impaired Tissue Integrity Plan of Care        Diagnoses(Active)    Anemia  Date:   07/03/2016 ; Diagnosis Type:   Discharge ; Confirmation:   Confirmed ; Clinical Dx:   Anemia ; Classification:   Medical ; Code:   ICD-10-CM ; Probability:   0 ; Diagnosis Code:   D64.9      Closed bimalleolar fracture  Date:   07/03/2016 ; Diagnosis Type:   Discharge ; Confirmation:   Confirmed ; Clinical Dx:   Closed bimalleolar fracture ; Classification:   Medical ; Code:   ICD-10-CM ; Probability:   0 ; Diagnosis Code:   D17.156J      History of gastric bypass  Date:   07/03/2016 ; Diagnosis Type:   Discharge ; Confirmation:   Confirmed ; Clinical Dx:   History of gastric bypass ; Classification:   Medical ; Code:   ICD-10-CM ; Probability:   0 ; Diagnosis Code:   Z98.890      S/P ORIF (open reduction internal fixation) fracture  Date:   07/03/2016 ; Diagnosis Type:   Discharge ; Confirmation:   Confirmed ; Clinical Dx:   S/P ORIF (open reduction internal fixation) fracture ; Classification:   Medical ; Code:   ICD-10-CM ; Probability:   0 ; Diagnosis Code:   Z96.7        Pain Assessment   Pain Location :   Ankle   Laterality :   Right   Quality :   Discomfort, Sharp, Throbbing   Time Pattern :   Intermittent   Onset :   Sudden   BENTON, OT, ANN K - 07/04/2016 11:09 EDT   Home Environment   Living Environment :   Home Environment  Living Situation:  Home independently  Performed By:  Tarry RN, Laymon Pinal 07/03/2016  Anticipated Need for Home Modification:  No  Performed By:  [name] 07/03/2016  Kitchen:  1st floor  Performed By:  [name] 07/03/2016  Laundry:  1st floor  Performed By:  EMMITT DARYLE B 07/03/2016  Lives In:  Single level home  Performed By:  EMMITT DARYLE B 07/03/2016  Lives With:  Alone  Performed By:  [name] 07/03/2016  Number of Stairs Outside:  15  Performed By:  [name] 07/03/2016  Patient's Responsibilities:  Driver, Employed, Health and  wellness, 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, Laundry, Manage Medications, Meal preparation, Personal ADL, Retired, Film/video editor  Performed By:  EMMITT DARYLE B 07/03/2016  Primary Bathroom:  1st floor  Performed By:  [name] 07/03/2016  Primary Bedroom:  1st floor  Performed By:  [name] 07/03/2016     Living Situation :   Home independently   Lives With :   Alone   Lives In :   Single level home   [place], [place], ANN K - 07/04/2016 11:09 EDT   Rehabilitation Stairs Grid     Outside Stairs          Number of Stairs :    15                 [place], [place], ANN K - 07/04/2016 11:09 EDT         Home Setup Grid   Primary Bedroom :   1st floor   Primary Bathroom :   1st floor   Kitchen :   1st floor   Laundry :   1st floor   [place], OT, ANN K - 07/04/2016 11:09 EDT   Patient's Responsibilities Rehab :   Driver, Employed, Health and wellness, 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, Laundry, Manage Medications, Meal preparation, Personal ADL, Retired, Scientist, research (life sciences), OT, [employer] K - 07/04/2016 11:09 EDT   Home Environment II   Living Environment :   Home Environment  Living Situation:  Home independently  Performed By:  Tarry RN, Laymon Pinal 07/03/2016  Anticipated Need for Home Modification:  No  Performed By:  [name] 07/03/2016  Kitchen:  1st floor  Performed By:  [name] 07/03/2016  Laundry:  1st floor  Performed By:  EMMITT DARYLE B 07/03/2016  Lives In:  Single level home  Performed By:  EMMITT DARYLE B 07/03/2016  Lives With:  Alone  Performed By:  [name] 07/03/2016  Number of Stairs Outside:  15  Performed By:  [name] 07/03/2016  Patient's Responsibilities:  Driver, Employed, Health and wellness, 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, Laundry, Manage Medications, Meal preparation, Personal ADL, Retired, Film/video editor  Performed By:  EMMITT DARYLE B 07/03/2016  Primary Bathroom:  1st floor  Performed By:  [name] 07/03/2016  Primary Bedroom:  1st floor  Performed By:  [name] 07/03/2016     BENTON, OT, ANN K - 07/04/2016 11:09 EDT   Prior Functional Status Grid   ADL :   Independent   Mobility :   Independent   Instrumental ADL :   Independent   Cognitive-Communication Skills :   Independent   BENTON, OT, ANN K - 07/04/2016 11:09 EDT   Additional Information :   Patient is in process of moving to new place in [place] which is a second floor apartment that has 2 flights to get up to - one straight set and then one spiral staircase.   BENTON, OT, ANN K - 07/04/2016 11:09 EDT  OT Basic ADL   Basic ADL Grid   Eating :   Modified independence   Grooming :   Supervision or setup   UE Dressing :   Supervision or setup   LE Dressing :   Contact guard assistance   Toileting :   Minimal contact assistance   Transfer Toilet :   Minimal contact assistance   BENTON, OT, ANN K - 07/04/2016 12:02 EDT   Limiting Factors :   Other: Wt bearing status   ADL Comments :   Pt has dentures and contacts.  Patient had completed bathing prior to therapist session with set up and assist from nursing pt reported.    Grooming with set up at sink;  UE dressing - sitting edge of bed pt able to don bra and pullover dress with supervision/set up.         LE Dressing - Pt required contact guard when reaching to right LE to don briefs over foot (leaning forward) and maintaining wt bearing precautions, contact guard assist when standing and pulling up over hips with stabilizing self one hand.  Toileting and toilet transfer with min assist for balance.            BENTON, OT, ANN K - 07/04/2016 12:02 EDT   Sitting Balance Assessment   Sitting Surface Evaluated Upon :   Bed   Activity :   Dressing   BENTON, OT, ANN K - 07/04/2016 11:09 EDT   Static Sitting Balance Assessment Grid   Sits Without UE Support :   Rehab Modified independence   Sits With One UE Support :   Rehab Complete independence   Sits With Two UE Support :   Rehab Complete independence   BENTON, OT, ANN K - 07/04/2016 11:09 EDT   Dynamic Sitting  Balance Assessment Grid   Posterior Shift :   Able   Lateral to the Left Shift :   Able   Lateral to the Right Shift :   Able   BENTON, OT, ANN K - 07/04/2016 11:09 EDT   Functional Impact :   Reaching forward slightly difficult with non wt bearing status.   BENTON, OT, ANN K - 07/04/2016 11:09 EDT   Cognition   Cognition :   Within functional limits   BENTON, OT, ANN K - 07/04/2016 11:09 EDT   Attention Cognition Grid   Sustained :   Within functional limits   Attention to Detail :   Within functional limits   BENTON, OT, ANN K - 07/04/2016 11:09 EDT   Cognition Indep Measure   Comprehension Indep Measure Interim :   Modified independence   Comprehension Mode Indep Measure :   Auditory   Expression Indep Measure Interim :   Complete independence   Expression Mode Indep Measure :   Vocal   Social Interaction Indep Measure Interim :   Modified independence   Problem Solving Indep Measure Interim :   Standby prompting   Memory Indep Measure Interim :   Complete independence   BENTON, OT, ANN K - 07/04/2016 11:09 EDT   Vision/Perception   Vision Status :   Within functional limits   Contacts :   Yes   BENTON, OT, ANN K - 07/04/2016 11:09 EDT   UE ROM/Strength   Upper Extremity Overall ROM Grid   Left Upper Extremity Active Range :   Within functional limits   Left Upper Extremity Passive Range :   Within functional limits   Right Upper  Extremity Active Range :   Within functional limits   Right Upper Extremity Passive Range :   Within functional limits   BENTON, OT, ANN K - 07/04/2016 11:09 EDT   Lt Upper Extremity Strength :   Within functional limits   BENTON, OT, ANN K - 07/04/2016 11:09 EDT   Left Upper Extremity Strength Grid   Shoulder Flexion :   4+   Shoulder Abduction :   4+   Elbow Flexion :   4+   Elbow Extension :   4   Finger Flexion :   4+   BENTON, OT, ANN K - 07/04/2016 11:09 EDT   Rt Upper Extremity Strength :   Within functional limits   BENTON, OT, ANN K - 07/04/2016 11:09 EDT   Right Upper Extremity Strength  Grid   Shoulder Flexion :   4+   Shoulder Abduction :   4+   Elbow Flexion :   4+   Elbow Extension :   4   Finger Flexion :   4+   BENTON, OT, ANN K - 07/04/2016 11:09 EDT   UE Coordination   Mean Age Gender Lt 9 Hole Peg Test :   19.48   Right Mean for Age/Gender :   17.86   BENTON, OT, ANN K - 07/04/2016 11:09 EDT   Left Upper Extremity Coordination Grid   Diadochokinesia :   Within functional limits   Finger to Nose :   Within functional limits   Finger Opposition :   Within functional limits   BENTON, OT, ANN K - 07/04/2016 11:09 EDT   Right Upper Extremty Coordination Grid   Diadochokinesia :   Within functional limits   Finger to Nose :   Within functional limits   Finger Opposition :   Within functional limits   BENT[place], OT, ANN K - 07/04/2016 11:09 EDT   UE Sensation   Left Sensation Grid   Light Touch :   Intact   BENT[place], OT, ANN K - 07/04/2016 11:09 EDT   Right Sensation Grid   Light Touch :   Intact   BENTON, OT, ANN K - 07/04/2016 11:09 EDT   UE Function   Left Grip Strength Trial 1 :   560 lb   Left Grip Strength Trial 2 :   60 lb   Left Grip Strength Trial 3 :   60 lb   Left Hand Grip Strength :   226.7 lb   Right Grip Strength Trial 1 :   55 lb   Right Grip Strength Trial 2 :   65 lb   Right Grip Strength Trial 3 :   65 lb   Right Hand Grip Strength :   61.7 lb   [place], OT, ANN K - 07/04/2016 12:02 EDT   [place], OT, ANN K - 07/04/2016 12:02 EDT   Upper Extremity Functional Scale Grid   Left :   Functional   Right :   Functional   BENTON, OT, ANN K - 07/04/2016 11:09 EDT   UE Tone         remainder (less than 1/2 of ROM)   Left Upper Extremity :   Normal   Right Upper Extremity :   Normal   BENT[place], OT, ANN K - 07/04/2016 11:09 EDT   Assessment   OT Impairments or Limitations :   Balance deficits, Other: wt bearing status   OT Discharge Recommendations :   Home with DME equipment.  OT Treatment Recommendations :   Patient is a 60 yo female who presents with decreased ability to perform transfers and  complete ADL tasks secondary to non wt bearing status. 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  Pt very motivated but also stressed with the current situation.   Patient will benefit from skilled OT intervention to improve functional mobility and independence at home.       BENTON, OT, ANN K - 07/04/2016 12:02 EDT   Long Term Goals   OT Patient/Caregiver Goal :   To be able to safely get around and take care of self in order to move to new location.   BENTON, OT, ANN K - 07/04/2016 12:02 EDT   ADL Long Term Goals Grid   Grooming Goal :   Complete independence   Bathing Goal :   Modified independence   Upper Extremity Dressing :   Complete independence   Lower Body Dressing Goal :   Modified independence   Toileting Goal :   Modified independence   Toilet Transfer Goal :   Modified independence   Tub, Shower Transfer :   Modified independence   BENT[place], OT, ANN K - 07/04/2016 12:02 EDT   Problem Solving Goal Indep Measure :   Modified independence   OT LT Goals Reviewed :   Yes   BENT[place], OT, ANN K - 07/04/2016 12:02 EDT   Short Term Goals   Bathing Goal Grid     Goal #1          Activity :    Tub transfer              Assist :    Modified independence              Equipment :    Tub bench              Status :    Initial              Comment :    Pt needs to work on this skill for new apartment.                BENTON, OT, ANN K - 07/04/2016 12:02 EDT         OT ST Goals Reviewed :   Yes   BENTON, OT, ANN K - 07/04/2016 12:02 EDT   Plan   Frequency :   Daily   Duration :   1    Duration Unit :   Weeks   Estimated Hours Per Day :   2-3 hrs per day   Planned Treatments :   Balance training, Basic Activities of Daily Living, Equipment training, Group therapy, HEP, Mobility training, Patient education, Therapeutic activities, Therapeutic exercises, Therapeutic exercises for strengthening and ROM   Treatment  Plan/Goals Established With Patient/Caregiver :   Yes   [place], OT, ANN K - 07/04/2016 12:02 EDT   Time Spent With Patient   OT Evaluation Units, Low Complexity :   1 Unit   OT Individual Eval Time, Low Complexity :   15 minutes   OT Time In :   8:30 EST   OT Time Out :   9:30 EST   OT Time In 2 :   11:00 EST   OT Time Out 2 :   11:25  EST   OT ADL TRAINING 15 MIN :   5    OT ADL Training Minutes :   75 minutes   OT Total Individual Therapy Time :   90 minutes   OT Total Timed Code Treatment Units :   5 units   OT Total Timed Code Treatment Minutes :   75 minutes   OT Total Untimed Code Treatment Minutes :   15 minutes   OT Total Treatment Time Rehab :   90 minutes   [place], OT, ANN K - 07/04/2016 12:02 EDT   Care Tool Section GG: Self Care Functional Abilities   GG0130 Shower, Bathe Self :   Supervision or touching assistance - Helper provides VERBAL CUES or TOUCHING/STEADYING assistance as patient/resident completes activity. Assistance may be provided throughout the activity or intermittently. - 04   [place], OT, ANN K - 07/06/2016 12:09 EDT   OT Care Tool Progress :   Independent - Patient/Resident completes the activities by him/herself, with or without an assistive device, with no assistance from a helper. - 06   GG0130 Oral Hygiene :   Setup or clean-up assistance - Helper SETS UP or CLEANS UP; Patient/Resident completes activity. Helper assists only prior to or following the activity. - 05   GG0130 Toileting Hygiene :   Supervision or touching assistance - Helper provides VERBAL CUES or TOUCHING/STEADYING assistance as patient/resident completes activity. Assistance may be provided throughout the activity or intermittently. - 04   GG0170 Toilet Transfer :   Partial/Moderate assistance - Helper does LESS THAN HALF the effort. Helper lifts, holds or supports trunk or limbs, but provides less than half the effort. - 03   GG0130 Upper Body Dressing :   Patient/Resident refused - 07   GG0130 Lower Body Dressing :    Partial/Moderate assistance - Helper does LESS THAN HALF the effort. Helper lifts, holds or supports trunk or limbs, but provides less than half the effort. - 03   GG0130 Putting On, Taking Off Footwear :   Setup or clean-up assistance - Helper SETS UP or CLEANS UP; Patient/Resident completes activity. Helper assists only prior to or following the activity. - 05   [place], OT, ANN K - 07/04/2016 12:02 EDT   Care Tool Section GG: Self-Care Goals   Self-Care Goals Grid   GG0130 Toileting Hygiene Goal :   Independent - Patient/Resident completes the activities by him/herself, with or without an assistive device, with no assistance from a helper. - 06   GG0170 Toilet Transfer Goal :   Independent - Patient/Resident completes the activities by him/herself, with or without an assistive device, with no assistance from a helper. - 06   [place], OT, ANN K - 07/04/2016 12:02 EDT   Section C: Cognitive Patterns   BIMS Able to Recall Sock :   Yes, no cue required   BIMS Able to Recall Blue :   Yes, no cue required   BIMS Able to Recall Bed :   Yes, no cue required   [place], OT, ANN K - 07/06/2016 12:09 EDT   Patient Makes Self Understood, Verbally or in Writing :   Understood   Words Patient Repeated :   Sock, [place], Bed   What Year Is It Right Now :   2017   Accuracy of Year Response :   Correct   What Month Is It Right Now :   August   Accuracy of Month Response :   Accurate within 5 days  What Day of the Week Is It :   Saturday   Accuracy of Day of Week Response :   Correct   [place], OT, ANN K - 07/04/2016 12:02 EDT   BIMS Summary Score :   15    [place]ENT[place], OT, ANN K - 07/06/2016 12:09 EDT

## 2016-07-05 NOTE — Progress Notes (Signed)
Functional Indep Measure Scores - Text       Functional Independence Measure Scores Entered On:  07/05/2016 10:38 EDT    Performed On:  07/04/2016 10:37 EDT by Lala Lund, RN, SARAH J-RN               Eating Score   Patient's Independence Level With Eating Tasks :   Independent/Modified independence   Independent or Modifications Needed :   Complete independence   Functional Independence Measure Eating :   Complete independence   MEWSHAW, RN, SARAH J-RN - 07/05/2016 10:37 EDT   Toileting Score   Patient's Independence Level with Toileting Tasks :   Assistance   Type of Assistance Necessary :   Incidental help for contact guard or steadying   Toileting :   Minimal contact assistance   MEWSHAW, RN, [facility] J-RN - 07/05/2016 10:37 EDT   Bladder Management Score   Patient's Independence Level with Bladder Management Tasks :   Independent/Modified independence   Independent or Modifications Needed :   Complete independence   Functional Independence Measure Bladder Management :   Complete independence   MEWSHAW, RN, [facility] J-RN - 07/05/2016 10:37 EDT   Transfer Bed/Chair/WC Score   Patient's independence Level with Bed, Chair, Wheelchair Tasks :   Setup/Supervision   Supervision or Setup Needed :   Locking wheels, Positioning chair, Standby prompting   Bed, Chair, Wheelchair Transfer :   Supervision or setup   [employer], RN, [employer] [facility] J-RN - 07/05/2016 10:37 EDT   Transfer Toilet Score   Patient's Independence Level with Transfer Toilet Tasks :   Setup/Supervision   Supervision or Setup Needed :   Locking wheels, Positioning chair, Standby prompting   Transfer Toilet :   Supervision or setup   MEWSHAW, RN, [facility] J-RN - 07/05/2016 10:37 EDT

## 2016-07-05 NOTE — Progress Notes (Signed)
Functional Indep Measure Scores - Text       Functional Independence Measure Scores Entered On:  07/05/2016 20:51 EDT    Performed On:  07/05/2016 20:50 EDT by Lala LundMEWSHAW, RN, SARAH J-RN               Eating Score   Patient's Independence Level With Eating Tasks :   Independent/Modified independence   Independent or Modifications Needed :   Complete independence   Functional Independence Measure Eating :   Complete independence   MEWSHAW, RN, SARAH J-RN - 07/05/2016 20:50 EDT   Toileting Score   Patient's Independence Level with Toileting Tasks :   Assistance   Type of Assistance Necessary :   Incidental help for contact guard or steadying   Toileting :   Minimal contact assistance   MEWSHAW, RN, [facility] J-RN - 07/05/2016 20:50 EDT   Bladder Management Score   Patient's Independence Level with Bladder Management Tasks :   Independent/Modified independence   Independent or Modifications Needed :   Absorbent pad   Functional Independence Measure Bladder Management :   Modified independence   MEWSHAW, RN, SARAH J-RN - 07/05/2016 20:50 EDT   Bowel Management Score   Patient's Independence Level with Bowel Management Tasks :   Independent/Modified independence   InM Bowel Mgmt Indep or Modifications :   Requires medication at least once a week   Functional Independence Measure Bowel Management :   Modified independence   MEWSHAW, RN, SARAH J-RN - 07/05/2016 20:50 EDT   Transfer Bed/Chair/WC Score   Patient's independence Level with Bed, Chair, Wheelchair Tasks :   Setup/Supervision   Supervision or Setup Needed :   Positioning chair, Positioning equipment, Standby prompting   Bed, Chair, Wheelchair Transfer :   Supervision or setup   [employer], RN, [facility] J-RN - 07/05/2016 20:50 EDT   Transfer Toilet Score   Patient's Independence Level with Transfer Toilet Tasks :   Setup/Supervision   Supervision or Setup Needed :   Locking wheels, Positioning chair, Positioning equipment   Transfer Toilet :   Supervision or setup   MEWSHAW, RN,  SARAH J-RN - 07/05/2016 20:50 EDT

## 2016-07-05 NOTE — Nursing Note (Signed)
Medication Administration Follow Up-Text       Medication Administration Follow Up Entered On:  07/05/2016 1:09 EDT    Performed On:  07/04/2016 23:50 EDT by Jovita KussmaulGADSDEN, RN, ARIEL L      Intervention Information:     acetaminophen-oxycodone  Performed by Jovita KussmaulGADSDEN, RN, ARIEL L on 07/04/2016 22:50:00 EDT       oxyCODONE-acetaminophen,2tabs  Oral,severe pain (8-10)       Medication Effectiveness Evaluation   Medication Administration Reason :   Pain   Medication Effective :   Yes   Medication Response :   Symptoms improved, Continue to observe for symptoms   GADSDEN, RN, ARIEL L - 07/05/2016 1:09 EDT

## 2016-07-05 NOTE — Progress Notes (Signed)
Functional Indep Measure Scores - Text       Functional Independence Measure Scores Entered On:  07/05/2016 16:01 EDT    Performed On:  07/05/2016 16:00 EDT by Eliseo Gum, OT, ANN K               Comprehension Score   Mode of Comprehension :   Auditory   Comprehends Complex or Abstract Information Without Prompting or Cueing :   Yes   Understands Complex or Abstract Directions and Conversations :   Mild difficulty   Comprehension Indep Measure Interim :   Modified independence   BENTON, OT, ANN K - 07/05/2016 16:00 EDT   Expression Score   Expression Mode :   Vocal   Expresses Complex or Abstract Information Without Prompting or Cueing :   Yes   Expresses Complex or Abstract Ideas :   Clearly and fluently at all times   Expression Indep Measure Interim :   Complete independence   BENTON, OT, ANN K - 07/05/2016 16:00 EDT   Social Interaction Score   Interacts Appropriately Without Supervision :   Yes   Interacts Appropriately :   Requires more than reasonable time to make decisions   Social Interaction Indep Measure Interim :   Modified independence   BENTON, OT, ANN K - 07/05/2016 16:00 EDT   Problem Solving Score   Solves Complex Problems :   No   Ability to Solve Routine Problems :   Standby, 90 percent, requires prompting 10 percent or less   Problem Solving Indep Measure Interim :   Standby prompting   BENTON, OT, ANN K - 07/05/2016 16:00 EDT   Memory Score   Recognizes, Remembers Routines, and Executes Requests Without Prompting :   Yes   Remembers and Executes Requests :   Consistently without need for repetition   Memory Indep Measure Interim :   Complete independence   BENTON, OT, ANN K - 07/05/2016 16:00 EDT

## 2016-07-05 NOTE — Progress Notes (Signed)
OT Inpatient Daily Documentation - Text       OT Inpatient Daily Documentation Entered On:  07/05/2016 16:04 EDT    Performed On:  07/05/2016 16:01 EDT by Eliseo GumBENTON, OT, ANN K               Reason for Treatment   *Reason for Referral :   ADMITTING DIAGNOSES:  1.  Right bimalleolar ankle fracture on August 22nd.  2.  Status post open reduction internal fixation on August 23rd.  3.  History of gastric bypass surgery for obesity.  4.  Anemia    Patient had fall at home and did not get help for 12 hrs. Had surgery and is non wt bearing right ankle. [place] lives by self [place] needs to be mod I to start new job.          3 hr rule  -   NON WT BEARING RIGHT LE  -  FALL RISK     *Chief Complaint :   Decreased mobility secondary to non wt bearing on right LE.  Impacts transfers, ADLs including dressing, bathing, toileting.        BENTON, OT, ANN K - 07/05/2016 16:01 EDT   Review/Treatments Provided   OT Goals :   OT Short Term Goals    07/04/2016  Bathing Goal #1: Tub transfer; Mod I; Tub bench; Initial; Pt needs to work on this skill for new apartment.     OT Plan :   Treatment Frequency: Daily Performed By: Wyonia HoughBENTON, OT, ANN K   07/04/2016  Treatment Duration: 1 Performed By: Eliseo GumBENTON, OT, ANN K   07/04/2016  Planned Treatments: Balance training, Basic Activities of Daily Living, Equipment training, Group therapy, HEP, Mobility training, Patient education, Therapeutic activities, Therapeutic exercises, Therapeutic exercises for strengthening and ROM Performed By: Wyonia HoughBENTON, OT, ANN K   07/04/2016     Occupational Therapy Orders :   Occupational Therapy Inpatient Additional Treatment Rehab - 07/04/16 12:29:16 EDT, Balance training, Basic Activities of Daily Living, Equipment training, Group therapy, HEP, Mobility training, Patient education, Therapeutic activities, Therapeutic exercises, Therapeutic exercises for strengthening and ROM, fo...  OT FIMS - 07/03/16 15:22:00 EDT, Daily     Pain Present :   Yes actual or  suspected pain   BENTON, OT, ANN K - 07/05/2016 16:01 EDT   Pain Assessment   Pain Location :   Ankle   Laterality :   Right   Self Report Pain :   Numeric rating scale   BENTON, OT, ANN K - 07/05/2016 16:01 EDT   Groups   Group :   Exercise   Therapeutic Focus of Group :   Coordination, Endurance, Gross motor control, Upper extremity strengthening   BENTON, OT, ANN K - 07/05/2016 16:01 EDT   Assessment   OT Impairments or Limitations :   Balance deficits, Other: wt bearing status   OT Discharge Recommendations :   Home with DME equipment.  Patient would probably benenfit from Knee walker.      OT Treatment Recommendations :   Good participation by patient in group.  Patient doing well with wheelchair mobility.      BENTON, OT, ANN K - 07/05/2016 16:01 EDT   Time Spent With Patient   OT Time In :   13:00 EST   OT Time Out :   13:30 EST   OT Group Therapy Units :   30 Minutes   OT Group Therapy Time :  30 minutes   OT Therapeutic Exercise Units :   0 units   OT Therapeutic Exercise Time :   0 minutes   OT Total Individual Therapy Time :   0 minutes   OT Total Group Therapy Time :   30 minutes   OT Treatment Time Comment :   Patient participated in UE group exercise session with good participation and effort.      OT Total Timed Code Treatment Units :   0 units   OT Total Timed Code Treatment Minutes :   0 minutes   OT Total Treatment Time Rehab :   0 minutes   BENTON, OT, ANN K - 07/05/2016 16:01 EDT

## 2016-07-05 NOTE — Progress Notes (Signed)
 Interdisciplinary Team Conference - Text       Interdisciplinary Team Conference PF Entered On:  07/05/2016 15:39 EDT    Performed On:  07/05/2016 15:38 EDT by [facility], PT, ADDIE G               Team Members   Care Manager :   GREG SYLIVA BROCKS   Nurse :   GEORGEANNA RN, CATHERINE   Occupational Therapist :   LIDDIE GILLIE JOSETTE FORBES   Physical Therapist :   HALL, PT, KAITLYN M   Rehab Physician :   WARMOTH-MD,  JAMES E   HALL, PT, KAITLYN M - 07/06/2016 11:28 EDT   Primary Nurse :   RAYMUND RN, DEBRA A   Team Conference Date :   07/06/2016 EDT   RAYMUND, RN, DEBRA A - 07/06/2016 10:33 EDT   Nursing Summary.   Bowel Level of Assistance Interim :   Modified independence   Bowel Management :   Continent   TUELL, RN, DEBRA A - 07/06/2016 10:33 EDT   Bowel Program :   None   TUELL, RN, DEBRA A - 07/06/2016 10:41 EDT     Bowel Movement Last Date :   07/05/2016 EDT   Bladder Level of Assistance Interim :   Complete independence   Bladder Management :   Continent   Urinary Elimination Management :   Voiding, no difficulties   Tracheostomy Information :   No qualifying data available.       Progress Achieved This Week :   Patient states that Oxycodone 20 mg every 6 hours is still not enough for pain relief. Last medicated for pain at 0650. Patient did not receive any pain medication during the night.   Nursing Team Notes Current :   Yes   TUELL, RN, DEBRA A - 07/06/2016 10:33 EDT   OT Basic ADL   Basic ADL Grid   Eating :   Modified independence   Grooming :   Supervision or setup   UE Dressing :   Supervision or setup   LE Dressing :   Contact guard assistance   Toileting :   Minimal contact assistance   Transfer Toilet :   Minimal contact assistance   BENTON, OT, ANN K - 07/05/2016 16:59 EDT   ADL Comments :   Pt has dentures and contacts.  Patient had completed bathing prior to therapist session with set up and assist from nursing pt reported.    Grooming with set up at sink;  UE dressing - sitting edge of bed pt able to don bra and  pullover dress with supervision/set up.         LE Dressing - Pt required contact guard when reaching to right LE to don briefs over foot (leaning forward) and maintaining wt bearing precautions, contact guard assist when standing and pulling up over hips with stabilizing self one hand.  Toileting and toilet transfer with min assist for balance.            Limiting Factors :   Pain, Other: Non wt bearing right LE   OT ADL Reviewed :   Yes   BENTON, OT, ANN K - 07/05/2016 16:59 EDT   OT Short Term Goals.   Bathing Goal Grid     Goal #1          Activity :    Tub transfer              Assist :  Modified independence              Equipment :    Tub bench              Status :    Initial              Comment :    Pt needs to work on this skill for new apartment.                LORELLA, OT, ANN K - 07/05/2016 16:59 EDT         OT STG Reviewed :   Yes   BENTON, OT, ANN K - 07/05/2016 16:59 EDT   OT Long Term Goals.   Patient/Caregiver Goals :   To be able to safely get around and take care of self in order to move to new location.   BENTON, OT, ANN K - 07/05/2016 16:59 EDT   Eating Goal   Grooming Goal :   Complete independence   Bathing Goal :   Modified independence   Upper Extremity Dressing :   Complete independence   Lower Body Dressing Goal :   Modified independence   Toileting Goal :   Modified independence   Toilet Transfer Goal :   Modified independence   Tub, Shower Transfer :   Modified independence   BENTON, OT, ANN K - 07/05/2016 16:59 EDT   OT Long Term Goals Reviewed :   Chaney LORELLA, OT, ANN K - 07/05/2016 16:59 EDT   Occupational Therapy Summary.   Barriers to Safe Discharge OT :   Limited family support, Other: Non Wt bearing status ;Patient moving to [place]  for new job next week.   OT Equipment Anticipated, Recommended :   Reacher, Other: Patient may benefit from knee walker since non wt bearing on right ankle   Additional Comments DME OT :   Patient would benefit from knee walker to get around at  work. WIll also need additional device for home and getting up stairs to new apartment.   OT Team Notes Current :   Yes   BENTON, OT, ANN K - 07/05/2016 16:59 EDT   PT Mobility   Mobility Grid   Roll Left :   Rehab Modified independence   Roll Right :   Rehab Modified independence   Roll Supine :   Rehab Modified independence   Supine to Sit :   Rehab Modified independence   Sit to Supine :   Rehab Modified independence   Scooting :   Rehab Modified independence   Sit to Stand :   Rehab Minimal assistance   Stand to Sit :   Rehab Minimal assistance   Transfer Bed to and From Chair :   Close supervision   Transfer Toilet :   Minimal contact assistance   [facility], PT, ADDIE G - 07/05/2016 15:38 EDT   Functional Mobility Details :   Pt c/o blurred vision in PM, unable to assess gait w crutches (nurse notified)   [facility], PT, ADDIE G - 07/05/2016 15:38 EDT   Amb Ability Varied Surf/Distraction Grid   Level Surfaces :   Rehab Minimal assistance   Uneven Surfaces :   Does not occur   Distracting Environments :   Does not occur   Curbs :   Does not occur   Stairs :   Does not occur   Ramp :   Does not occur   LYNCH, PT, ADDIE G -  07/05/2016 15:38 EDT   Transfer Type :   Squat pivot   Ambulation Device Utilized :   Gait belt, Parallel bars   Distance Level Surface :   10 ft   Other Mobility Training :   rolling stool supporting RLE in // bars   PT Mobility Reviewed :   Yes   LYNCH, PT, ADDIE G - 07/05/2016 15:38 EDT   PT WC Management   Type of Wheelchair :   Manual wheelchair   [place], PT, ADDIE G - 07/05/2016 15:38 EDT   Wheelchair Mobility Grid   Level Surfaces :   Rehab Modified independence   [place], PT, ADDIE G - 07/05/2016 15:38 EDT   Wheelchair Mobility Level Distance :   150 ft   Wheelchair Mobility Reviewed :   Yes   LYNCH, PT, ADDIE G - 07/05/2016 15:38 EDT   PT Short Term Goals.   Bed Mobility Goal Grid     Goal #1          Descriptors :    Roll to right and left, Sit to supine, Supine to sit              Level :    Modified  independence              Status :    Goal met              Date Met :    07/04/2016 EDT                [facility], PT, ADDIE G - 07/05/2016 15:38 EDT         Transfers Goal Grid     Goal #1  Goal #2        Descriptors :    Squat pivot transfer, Other: NWB on RLE   Sit to stand, Stand to sit           Level :    Close supervision   Minimal assistance           Device :       Axillary crutches           Status :    Goal met   Goal met           Date Met :    07/04/2016 EDT   07/04/2016 EDT             [facility], PT, ADDIE G - 07/05/2016 15:38 EDT  [facility], PT, ADDIE G - 07/05/2016 15:38 EDT        W/C Management Grid     Goal #1          Descriptors :    Manual wheelchair propulsion indoors              Level :    Modified independence              Distance :    150 ft              Status :    Goal met              Date Met :    07/04/2016 EDT                [facility] PT, ADDIE G - 07/05/2016 15:38 EDT         PT STG Reviewed BETHA Chaney QUIVERS, PT, ADDIE G - 07/05/2016 15:38 EDT  PT Long Term Goals.   PT Patient,Caregiver Goal :   Be able to go to my new job in [place], [place] in 10 days.   [facility], PT, ADDIE G - 07/05/2016 15:38 EDT   Mobility Goals Grid   Bed, Chair, Wheelchair Goal :   Modified independence   Toilet Transfer Goal :   Modified independence   Ambulation Level Surfaces Goal :   Modified independence   Wheelchair Mobility Level Surfaces Goal :   Modified independence   Stairs Ambulation Assistance Level Goal :   Modified independence   LYNCH, PT, ADDIE G - 07/05/2016 15:38 EDT   PT LTG Reconcilation :   Ambulation NWB on RLE w both rolling knee walker and axillary crutches.   Type of Wheelchair Goal :   Manual wheelchair   Mode of Locomotion Goal :   Walk   [facility], PT, ADDIE G - 07/05/2016 15:38 EDT   Physical Therapy Summary.   PT Equipment Anticipated or Recommended :   Crutches, Other: rolling knee walker   Additional Comments DME PT :   Florence Amel, they will deliver rolling knee walker Monday if called and stated needs  today.  [employer] has none in stock. Pt wants the knee rolling walker for work and axillary crutches for use at home (and stairs if necessary).   PT Team Notes Current :   Yes   LYNCH, PT, ADDIE G - 07/05/2016 15:38 EDT   Severity Level.   Comprehension Indep Measure Interim :   Modified independence   Comprehension Mode :   Auditory   Expression Indep Measure Interim :   Complete independence   Expression Mode :   Vocal   Problem Solving Indep Measure Interim :   Standby prompting   Memory Indep Measure Interim :   Complete independence   Social Interaction Indep Measure Interim :   Modified independence   BENTON, OT, ANN K - 07/05/2016 16:59 EDT   SLP Short Term Goals.   SLP STG Reviewed :   Yes   HALL, PT, KAITLYN M - 07/06/2016 11:28 EDT   Speech Therapy Summary.   SLP Progress Note Current :   N/A   HALL, PT, KAITLYN M - 07/06/2016 11:28 EDT   Psychology Summary.   Psychology Progress Notes Current :   N/A   HALL, PT, KAITLYN M - 07/06/2016 11:28 EDT   Interdisciplinary Discharge Planning.   Discharge Disposition Plan :   home alone    Rehab Anticipated Discharge Date :   07/13/2016 EDT   Follow-Up Plans :   OT, Physical Therapy: To Evaulate, Treat and Manage Care of Related Diagnosis   Team Conference Discussion RTF :   Kaitlyn Hall, PT, DPT served as scribe this date.    lives alone, 15 [address], working as a paralegal prior to admission. pt is moving for a new job at the beginning of September in [place]. pt is not planning to return to [place] home following d/c from [facility], moving into a 3rd story apartment without elevator, planning to stay at a hotel near her new job due to NWB status. if pt staying at hotel, unable to receive [facility] services, wait until outpatient services once WB status upgraded(?)--will speak with primary PT/OT regarding their recommendations.     Barriers to goals/Reasons for skilled intervention :   Decreased endurance, Decreased sitting/standing balance, Decreased strength/ROM, Difficulty maintaining WB  status, Pain management issues, Safety issues, Skin integrity issues   Medical/rehab reason for continued  stay :   Community mobility issue, Electrolyte imbalance, Home mobility issue, Pain management, Pending labs/diagnostics, Skin integrity, Wound   HALL, PT, KAITLYN M - 07/06/2016 11:28 EDT   Anticipated Therapy Interventions.   PT Estimated Hours per Week :   7.5 hours per week   Therapy Activity Tolerance :   3 hours over 5 days   OT Estimated Hours Per Week :   7.5 hours per week   [place], PT, KAITLYN M - 07/06/2016 11:28 EDT   Rehab Team Goals   Team Mobility Goals Grid     Goal #1          Team Mobility Goals :    Bed and or wheelchair transfer              Team Mobility Goal Status :    Initial                HALL, PT, KAITLYN M - 07/06/2016 11:28 EDT         Team Mobility Goals 2 Grid     Goal #1          Team Mobility Goals :    Toilet transfers              Team Mobility Goal Status :    Initial                HALL, PT, KAITLYN M - 07/06/2016 11:28 EDT         Team Pain Management Goals Grid     Goal #1          Team Pain Management Goals :    Acute pain control, Knowledge of meds, Knowledge of non-pharmacological options              Team Pain Management Goals Status :    Initial                HALL, PT, KAITLYN M - 07/06/2016 11:28 EDT

## 2016-07-06 NOTE — Progress Notes (Signed)
OT Inpatient Daily Documentation - Text       OT Inpatient Daily Documentation Entered On:  07/06/2016 13:32 EDT    Performed On:  07/06/2016 13:28 EDT by Margarite Gouge, Anita Vazquez               Reason for Treatment   Subjective Statement :   Pt agreeable to therapy.     *Reason for Referral :   ADMITTING DIAGNOSES:  1.  Right bimalleolar ankle fracture on August 22nd.  2.  Status post open reduction internal fixation on August 23rd.  3.  History of gastric bypass surgery for obesity.  4.  Anemia    Patient had fall at home and did not get help for 12 hrs. Had surgery and is non wt bearing right ankle. Pt lives by self and is moving to [place] and needs to be mod I to start new job.          3 hr rule  -   NON WT BEARING RIGHT LE  -  FALL RISK     *Chief Complaint :   Decreased mobility secondary to non wt bearing on right LE.  Impacts transfers, ADLs including dressing, bathing, toileting.        Anita Vazquez - 07/06/2016 13:28 EDT   Review/Treatments Provided   OT Goals :   OT Short Term Goals    07/05/2016  Bathing Goal #1: Tub transfer; Mod I; Tub bench; Initial; Pt needs to work on this skill for new apartment.     OT Plan :   Treatment Frequency: Daily Performed By: Wyonia Hough, ANN K   07/04/2016  Treatment Duration: 1 Performed By: Eliseo Gum OT, ANN K   07/04/2016  Planned Treatments: Balance training, Basic Activities of Daily Living, Equipment training, Group therapy, HEP, Mobility training, Patient education, Therapeutic activities, Therapeutic exercises, Therapeutic exercises for strengthening and ROM Performed By: Wyonia Hough, ANN K   07/04/2016     Occupational Therapy Orders :   Occupational Therapy Inpatient Additional Treatment Rehab - 07/04/16 12:29:16 EDT, Balance training, Basic Activities of Daily Living, Equipment training, Group therapy, HEP, Mobility training, Patient education, Therapeutic activities, Therapeutic exercises, Therapeutic exercises for strengthening and ROM, fo...  OT  FIMS - 07/03/16 15:22:00 EDT, Daily     Pain Present :   Yes actual or suspected pain   OT Therapeutic Activity,Mobility,Balance :   Yes   OT Therapeutic Exercise :   Yes   MARSHALL, COTA, Anita Vazquez - 07/06/2016 13:28 EDT   Pain Assessment   Pain Location :   Foot   Laterality :   Right   Duration :   Nrsg aware.   Self Report Pain :   Numeric rating scale   MARSHALL, Erby Pian Vazquez - 07/06/2016 13:28 EDT   Therapeutic Activities   OT Therapeutic Activities RTF :   Therapeutic Activities    No qualifying data available     Carolin Coy - 07/06/2016 13:28 EDT   OT Therapeutic Activities Grid     Activity 1          Activities :    Transitional movement              Assist :    Modified independence              Equipment :    Wheelchair              Comments :  Mod I lateral scoot t/f bed>w/Vazquez.                Anita MiloMARSHALL, COTA, Anita Vazquez - 07/06/2016 13:28 EDT         Therapeutic Exercise   Therapeutic Exercise RTF :   Therapeutic Exercise    No qualifying data available     Carolin CoyMARSHALL, COTA, Anita Vazquez - 07/06/2016 13:28 EDT   OT Therapeutic Exercise Grid     Exercise 1  Exercise 2        Exercise :    Lower extremity strengthening   Upper extremity strengthening           Position :    Other: Unsupported/supported stand   Sitting in wheelchair           Equipment/Device :       Free weights           Repetition/Time :       all planes/ranges x2sets,x20reps each           Resistance/Assist :       #3           Comments :    Pt standing at table engaging in interactive game to incr LLE strength and balance demo'ing correct NWB'ing status of RLE for incr'd I during ADLs and fxl t/fs.   to incr BUE strength and endurance for incr'd I during ADLs and fxl t/fs.             Anita MiloMARSHALL, COTA, Anita Vazquez - 07/06/2016 13:28 EDT  Carolin CoyMARSHALL, COTA, Anita Vazquez - 07/06/2016 13:28 EDT        Education   Teaching Method :   Explanation   Carolin CoyMARSHALL, COTA, Anita Vazquez - 07/06/2016 13:28 EDT   Occupational Therapy Education Grid   Bed to Chair  Transfers :   [employer] Squ[employer]bVerbalizes understanding, Demonstrates   Exercise Program :   B[employer]ers SquibbVerbalizes understanding, Demonstrates   Home Safety :   TEFL teacherVerbalizes understanding, Demonstrates   MARSHALL, COTA, Anita Vazquez - 07/06/2016 13:28 EDT   Assessment   OT Impairments or Limitations :   Balance deficits, Other: wt bearing status   Barriers to Safe Discharge OT :   Limited family support, Other: Non Wt bearing status ;[place]   OT Discharge Recommendations :   Home with DME equipment.  Patient would probably benenfit from Knee walker.      OT Treatment Recommendations :   Pt tolerated tx well today. Pt left seated in w/Vazquez Vazquez all needs in reach.     Anita MiloMARSHALL, COTA, Anita Vazquez - 07/06/2016 13:28 EDT   Time Spent With Patient   OT Time In :   10:00 EST   OT Time Out :   11:30 EST   OT ADL TRAINING 15 MIN :   1    OT ADL Training Minutes :   15 minutes   OT Therapeutic Exercise Units :   5 units   OT Therapeutic Exercise Time :   75 minutes   OT Total Individual Therapy Time :   90 minutes   OT Total Timed Code Treatment Units :   6 units   OT Total Timed Code Treatment Minutes :   90 minutes   OT Total Treatment Time Rehab :   90 minutes   MARSHALL, COTA, Anita Vazquez - 07/06/2016 13:28 EDT

## 2016-07-06 NOTE — Care Plan (Signed)
ED Pat Edu     Patient Education Materials Follows:

## 2016-07-06 NOTE — Progress Notes (Signed)
Functional Indep Measure Scores - Text       Functional Independence Measure Scores Entered On:  07/06/2016 13:28 EDT    Performed On:  07/06/2016 13:28 EDT by Margarite GougeMARSHALL, COTA, RACHEL C               Transfer Bed/Chair/WC Score   Patient's independence Level with Bed, Chair, Wheelchair Tasks :   Independent/Modified independence   Independent or Modifications Needed :   Assistive device(s), manages independently   Bed, Chair, Wheelchair Transfer :   Modified independence   Carolin CoyMARSHALL, COTA, RACHEL C - 07/06/2016 13:28 EDT   Comprehension Score   Mode of Comprehension :   Auditory   Comprehends Complex or Abstract Information Without Prompting or Cueing :   Yes   Understands Complex or Abstract Directions and Conversations :   At all times   Comprehension Indep Measure Interim :   Complete independence   MARSHALL, COTA, RACHEL C - 07/06/2016 13:28 EDT   Expression Score   Expression Mode :   Vocal   Expresses Complex or Abstract Information Without Prompting or Cueing :   Yes   Expresses Complex or Abstract Ideas :   Clearly and fluently at all times   Expression Indep Measure Interim :   Complete independence   Dionne MiloMARSHALL, COTA, RACHEL C - 07/06/2016 13:28 EDT   Social Interaction Score   Interacts Appropriately Without Supervision :   Yes   Interacts Appropriately :   At all times   Social Interaction Indep Measure Interim :   Complete independence   Margarite GougeMARSHALL, COTA, RACHEL C - 07/06/2016 13:28 EDT   Problem Solving Score   Solves Complex Problems :   Yes   Ability to Solve Complex Problems :   Makes decisions with mild difficulty   Problem Solving Indep Measure Interim :   Modified independence   MARSHALL, COTA, RACHEL C - 07/06/2016 13:28 EDT   Memory Score   Recognizes, Remembers Routines, and Executes Requests Without Prompting :   Yes   Remembers and Executes Requests :   Consistently without need for repetition   Memory Indep Measure Interim :   Complete independence   MARSHALL, COTA, RACHEL C - 07/06/2016 13:28 EDT

## 2016-07-06 NOTE — Progress Notes (Signed)
Functional Indep Measure Scores - Text       Functional Independence Measure Scores Entered On:  07/06/2016 10:44 EDT    Performed On:  07/06/2016 10:43 EDT by Dillon Bjork, RN, DEBRA A               Toileting Score   Patient's Independence Level with Toileting Tasks :   Independent/Modified independence   Independent or Modifications Needed :   Assistive device(s), manages independently   Toileting :   Modified independence   TUELL, RN, DEBRA A - 07/06/2016 10:43 EDT   Bladder Management Score   Patient's Independence Level with Bladder Management Tasks :   Independent/Modified independence   Independent or Modifications Needed :   Complete independence   Functional Independence Measure Bladder Management :   Complete independence   TUELL, RN, DEBRA A - 07/06/2016 10:43 EDT   Bowel Management Score   Patient's Independence Level with Bowel Management Tasks :   Independent/Modified independence   InM Bowel Mgmt Indep or Modifications :   Complete independence   Functional Independence Measure Bowel Management :   Complete independence   TUELL, RN, DEBRA A - 07/06/2016 10:43 EDT

## 2016-07-06 NOTE — Nursing Note (Signed)
Medication Administration Follow Up-Text       Medication Administration Follow Up Entered On:  07/06/2016 10:40 EDT    Performed On:  07/06/2016 7:36 EDT by Dillon Bjork, RN, DEBRA A      Intervention Information:     oxycodone  Performed by Mayford Knife RN, MICHELE on 07/06/2016 06:36:00 EDT       oxycodone,20mg   Oral,moderate pain (4-7)       Medication Effectiveness Evaluation   Medication Administration Reason :   Pain   Medication Effective :   No   Medication Response :   Continue to observe for symptoms, Provider notified   TUELL, RN, DEBRA A - 07/06/2016 10:40 EDT

## 2016-07-06 NOTE — Case Communication (Signed)
DME Face to Face Documentation - Text       DME Face to Face Documentation Entered On:  07/06/2016 9:11 EDT    Performed On:  07/06/2016 9:10 EDT by Genice RougeWARMOTH-MD,   E               DME Face to Face Encounter   Encounter Date :   07/06/2016 9:10 EDT   DME Certification Statement :   I had a face to face encounter with this patient that meets the provider face to face encounter requirements., The encounter was related in whole or in part for the following medical conditions, which is the primary reason for Durable Medical Equipment, Based upon my findings during evaluation of this patient, this equipment as specified on attached is medically necessary for the patient in the home., I have spoken to the patient about the need for this equipment and the required use at home within the past 30 days.   DME Walker :   The patient has a mobility limitation that significantly impairs beneficiary's ability to participate in one or more mobility related activities of daily living in the home AND, The patient is able to safely use the walker AND, The functional mobility deficit can be sufficiently resolved by the use of the walker   DME Other :   axillary crutches  need for mobility for non weight bearing of right lower extremity due to bimalleolar fractures fpr 6 weeks.   Carolyne FiscalWARMOTH-MD,   E - 07/06/2016 9:10 EDT

## 2016-07-06 NOTE — Progress Notes (Signed)
 PT Inpatient Daily Documentation - Text       PT Inpatient Daily Documentation Entered On:  07/06/2016 11:28 EDT    Performed On:  07/06/2016 8:30 EDT by SYNETTA, PTA, LORI C               Reason for Treatment   *Reason for Referral :   R bimalleolar fx s/p ORIF and cast    PMH: gastric bypass, anemia    Precautions: NWB  on RLE, fall    schedule: 3 hr.     *Chief Complaint :   9/10 pain in R ankle in AM, 10/10 in PM (already asked but hadn't received meds)     FORTUNA, PTA, LORI C - 07/06/2016 11:25 EDT   Review/Treatments Provided   PT Goals :   PT Short Term Goals    07/05/2016     PT Plan :   Treatment Frequency:  Mo/Tu/We/Th/Fr/Sa (modified)   Performed By: LEW ALMETA SAUER A  07/04/2016 17:06  Treatment Duration: 1 Performed By: LEW ALMETA SAUER A  07/04/2016 17:06  Planned Treatments: Balance training, Equipment training, Functional training, Gait training, Pain management, Patient education, Stair training, Therapeutic activities, Therapeutic exercises, Transfer training, Wheelchair assessment and management Performed By: LEW ALMETA SAUER A  07/04/2016 17:06     Short Term Goals Reviewed :   Yes   Physical Therapy Orders :   Physical Therapy Inpatient Additional Treatment Rehab - 07/04/16 17:55:54 EDT, Balance training, Equipment training, Functional training, Gait training, Pain management, Patient education, Stair training, Therapeutic activities, Therapeutic exercises, Transfer training, Wheelchair assessment and management...  PT FIMS - 07/03/16 15:22:00 EDT, Daily     Pain Present :   No actual or suspected pain   PT Therapeutic Activity,Mobility,Balance :   Yes   FORTUNA, PTA, LORI C - 07/06/2016 11:25 EDT   Therapeutic Activities/Mobility/Balance   Functional Activity :   Therapeutic Activities    No qualifying data available     SYNETTA JOSETTA KATHERYN JAYSON - 07/06/2016 11:25 EDT   FORTUNA, PTA, LORI C - 07/06/2016 11:25 EDT   PT Therapeutic Activities Grid     Activity 3  Activity 1  Activity 2       Activity :    Other: DME   Bed mobility, Transfer training   Gait        Comment :    Edu pt on stair negotiation with RW vs crutches. Talked about how DME would get into home as pt want knee scooter which she would not be able to carry up 2 flights of stairs. [employer] will not pay for knee scooter so options discussed on payment out of pocket   Squat pivot wc<>plinth and wc to bed as well as sit to supine on hospital bed with Mod I.   Pt ambulated 20' x 2 with RW and spv for safety, cues to decrese jump and to focus on pushing up with BUEs in order to advance LE, able to maintain RLE NWB throughout.          SYNETTA JOSETTA KATHERYN C - 07/06/2016 14:45 EDT  SYNETTA JOSETTA, LORI C - 07/06/2016 11:25 EDT  SYNETTA JOSETTA, LORI C - 07/06/2016 11:25 EDT       Reassess Mobility :   Yes   FORTUNA, PTA, LORI C - 07/06/2016 11:25 EDT   PT Mobility   Mobility Grid   Roll Left :   Rehab Modified independence   Roll Right :   Rehab Modified  independence   Roll Supine :   Rehab Modified independence   Supine to Sit :   Rehab Modified independence   Sit to Supine :   Rehab Modified independence   Scooting :   Rehab Modified independence   Sit to Stand :   Rehab Minimal assistance   Stand to Sit :   Rehab Minimal assistance   Transfer Bed to and From Chair :   Close supervision   Transfer Toilet :   Supervision or setup   FORTUNA, PTA, LORI C - 07/06/2016 11:25 EDT   Functional Mobility Details :   Pt c/o blurred vision in PM, unable to assess gait w crutches (nurse notified)   FORTUNA, PTA, LORI C - 07/06/2016 11:25 EDT   Amb Ability Varied Surf/Distraction Grid   Level Surfaces :   Close supervision   Uneven Surfaces :   Does not occur   Distracting Environments :   Does not occur   Curbs :   Does not occur   Stairs :   Does not occur   Ramp :   Does not occur   FORTUNA, PTA, LORI C - 07/06/2016 11:25 EDT   Transfer Type :   Squat pivot   Ambulation Device Utilized :   Gait belt, Parallel bars   Distance Level Surface :   20 ft   Other  Mobility Training :   rolling stool supporting RLE in // bars   PT Mobility Reviewed :   Yes   FORTUNA, PTA, LORI C - 07/06/2016 11:25 EDT   Short Term Goals   Bed Mobility Goal Grid     Goal #1          Descriptors :    Roll to right and left, Sit to supine, Supine to sit              Level :    Modified independence              Status :    Goal met              Date Met :    07/04/2016 EDT                FORTUNA, PTA, LORI C - 07/06/2016 11:25 EDT         Transfers Goal Grid     Goal #1  Goal #2        Descriptors :    Squat pivot transfer, Other: NWB on RLE   Sit to stand, Stand to sit           Level :    Close supervision   Minimal assistance           Device :       Axillary crutches           Status :    Goal met   Goal met           Date Met :    07/04/2016 EDT   07/04/2016 EDT             FORTUNA, PTA, LORI C - 07/06/2016 11:25 EDT  FORTUNA, PTA, LORI C - 07/06/2016 11:25 EDT        W/C Management Grid     Goal #1          Descriptors :    Manual wheelchair propulsion indoors  Level :    Modified independence              Distance :    150 ft              Status :    Goal met              Date Met :    07/04/2016 EDT                SYNETTA HACKNEY, LORI C - 07/06/2016 11:25 EDT         PT ST Goals Reviewed :   Chaney SYNETTA, PTA, LORI C - 07/06/2016 11:25 EDT   Assessment   PT Impairments or Limitations :   Ambulation deficits, Endurance deficits, Equipment training, Home accessibility/housing, Pain limiting function, Transfer deficits   Barriers to Safe Discharge PT :   Limited family support, Limited social support, Severity of deficits   Discharge Recommendations :   Pt has new job awaiting her in [place], [place] in 10 days, will be living in 3rd floor apt!  She needs a rolling knee walker for use at work and axillary crutches for use at home (and maybe stairs?)     PT Treatment Recommendations :   Continue skilled PT to increase functional mobility and independence.      SYNETTA HACKNEY KATHERYN JAYSON - 07/06/2016  11:25 EDT   Time Spent With Patient   PT Time In 2 :   14:00 EST   PT Time Out 2 :   14:30 EST   FORTUNA, PTA, LORI C - 07/06/2016 14:45 EDT   PT Time In :   8:30 EST   PT Time Out :   9:30 EST   FORTUNA, PTA, LORI C - 07/06/2016 11:25 EDT   PT ADL TRAINING 15 MN :   6 units   PT ADL Training Time :   90 minutes   PT Total Individual Therapy Time :   90 minutes   PT Total Timed Code Treatment Units :   6 units   PT Total Timed Code Tx Minutes :   90 minutes   PT Total Treatment Time Rehab :   90 minutes   FORTUNA, PTA, LORI C - 07/06/2016 14:45 EDT

## 2016-07-06 NOTE — Case Communication (Signed)
 CM Discharge Planning Assessment - Text       CM Discharge Planning Ongoing Assessment Entered On:  07/06/2016 16:38 EDT    Performed On:  07/06/2016 16:35 EDT by GREG LLANOS C               Discharge Needs I   Previously Documented Discharge Needs :   DISCHARGE PLAN/NEEDS:No discharge data available.  EQUIPMENT/TREATMENT NEEDS:No discharge data available.     Previously Documented Benefits Information :   Performed By: [name]  - 07/03/16 10:39:00       Anticipated Discharge Date :   07/13/2016 EDT   Anticipated Discharge Time Slot :   1000-1200   CM Progress Note :   Pt is a 60 year old female.  Pt recently accepted a new job in [place]. that she needs to be mobile (able to carry over 30lbs) and her new apartment is on the 3rd floor.  Pt is upset over her current financial concerns and being homeless by this weekend.  TC will be held on 07/06/2016.    07/06/2016  SW met with Pt to present ITC-Summary.  Pt verbalized being upset about being homeless as of this weekend and how she will not be able to afford DME.  Pt just accepted a new job in [place]. & she is expected to start work on 07/14/2016.  Pt was upset when presented with anticipated D/C date, as evidenced by her tearsfulness.  SW spoke with her about exploring asking family for assistance, but she does not want to bother or ask for money.  SW made other suggestions that Pt discounted.  SW will continue to assist.  Insurance updated on 07/10/2016.  vcd     GREG LLANOS BROCKS - 07/06/2016 16:35 EDT

## 2016-07-06 NOTE — Progress Notes (Signed)
Functional Indep Measure Scores - Text       Functional Independence Measure Scores Entered On:  07/06/2016 14:48 EDT    Performed On:  07/06/2016 14:47 EDT by Jonny Ruiz, PTA, LORI C               Walk/Wheelchair Score   Mode of Locomotion Goal :   Walk   Patient Ambulates :   Patient ambulates   Distance Ambulated :   15 ft   Amount of Assistance Necessary :   Standby supervision   Ambulation Device Utilized :   Teaching laboratory technician Independence Measure Walk :   Total assistance   Vance Peper - 07/06/2016 14:47 EDT

## 2016-07-06 NOTE — Nursing Note (Signed)
Medication Administration Follow Up-Text       Medication Administration Follow Up Entered On:  07/06/2016 17:26 EDT    Performed On:  07/06/2016 13:54 EDT by Dillon Bjork, RN, DEBRA A      Intervention Information:     oxycodone  Performed by Dillon Bjork, RN, DEBRA A on 07/06/2016 12:54:00 EDT       oxycodone,20mg   Oral,moderate pain (4-7)       Medication Effectiveness Evaluation   Medication Administration Reason :   Pain   Medication Effective :   Yes   Medication Response :   Symptoms improved   TUELL, RN, DEBRA A - 07/06/2016 17:26 EDT

## 2016-07-06 NOTE — Case Communication (Signed)
DME Face to Face Documentation - Text       DME Face to Face Documentation Entered On:  07/06/2016 9:10 EDT    Performed On:  07/06/2016 9:07 EDT by Genice Rouge               DME Face to Face Encounter   Encounter Date :   07/06/2016 9:07 EDT   DME Certification Statement :   I had a face to face encounter with this patient that meets the provider face to face encounter requirements., The encounter was related in whole or in part for the following medical conditions, which is the primary reason for Durable Medical Equipment, Based upon my findings during evaluation of this patient, this equipment as specified on attached is medically necessary for the patient in the home., I have spoken to the patient about the need for this equipment and the required use at home within the past 30 days.   DME Walker :   The patient has a mobility limitation that significantly impairs beneficiary's ability to participate in one or more mobility related activities of daily living in the home AND, The patient is able to safely use the walker AND, The functional mobility deficit can be sufficiently resolved by the use of the walker   DME Other :   axillary crutches  need for mobility for non weight bearing of right lower extremity due to bimalleolar fractures fpr 6 weeks.     Carolyne Fiscal E - 07/06/2016 9:07 EDT

## 2016-07-06 NOTE — Nursing Note (Signed)
Medication Administration Follow Up-Text       Medication Administration Follow Up Entered On:  07/06/2016 12:55 EDT    Performed On:  07/05/2016 23:03 EDT by Dillon Bjork, RN, DEBRA A      Intervention Information:     oxycodone  Performed by Mayford Knife, RN, MICHELE on 07/05/2016 22:03:00 EDT       oxycodone,20mg   Oral,moderate pain (4-7)       Medication Effectiveness Evaluation   Medication Administration Reason :   Pain   Medication Effective :   No   Medication Response :   Continue to observe for symptoms, Provider notified   TUELL, RN, DEBRA A - 07/06/2016 12:54 EDT

## 2016-07-07 NOTE — Progress Notes (Signed)
Functional Indep Measure Scores - Text       Functional Independence Measure Scores Entered On:  07/07/2016 9:19 EDT    Performed On:  07/07/2016 9:17 EDT by Dillon Bjork, RN, DEBRA A               Toileting Score   Patient's Independence Level with Toileting Tasks :   Independent/Modified independence   Independent or Modifications Needed :   Assistive device(s), manages independently   Toileting :   Modified independence   TUELL, RN, DEBRA A - 07/07/2016 9:17 EDT   Bladder Management Score   Patient's Independence Level with Bladder Management Tasks :   Independent/Modified independence   Independent or Modifications Needed :   Complete independence   Functional Independence Measure Bladder Management :   Complete independence   TUELL, RN, DEBRA A - 07/07/2016 9:17 EDT   Bowel Management Score   Patient's Independence Level with Bowel Management Tasks :   Independent/Modified independence   InM Bowel Mgmt Indep or Modifications :   Complete independence   Functional Independence Measure Bowel Management :   Complete independence   TUELL, RN, DEBRA A - 07/07/2016 9:17 EDT

## 2016-07-07 NOTE — Progress Notes (Signed)
 PT Inpatient Daily Documentation - Text       PT Inpatient Daily Documentation Entered On:  07/07/2016 15:45 EDT    Performed On:  07/07/2016 9:00 EDT by BERNARDINO HACKNEY, ROBYN               Reason for Treatment   Subjective Statement :   Pt in bed in a.m., w/c in p.m. Agreeable. Concerned about needed equipment at d/c. Pt given info on equipment      *Reason for Referral :   R bimalleolar fx s/p ORIF and cast    PMH: gastric bypass, anemia    Precautions: NWB  on RLE, fall    schedule: 3 hr.     *Chief Complaint :   9/10 pain in R ankle in AM, 10/10 in PM (already asked but hadn't received meds)     RYAN, PTA, ROBYN - 07/07/2016 15:28 EDT   Review/Treatments Provided   PT Goals :   PT Short Term Goals    07/06/2016     PT Plan :   Treatment Frequency:  Mo/Tu/We/Th/Fr/Sa (modified)   Performed By: LEW ALMETA SAUER A  07/04/2016 17:06  Treatment Duration: 1 Performed By: LEW ALMETA SAUER A  07/04/2016 17:06  Planned Treatments: Balance training, Equipment training, Functional training, Gait training, Pain management, Patient education, Stair training, Therapeutic activities, Therapeutic exercises, Transfer training, Wheelchair assessment and management Performed By: LEW ALMETA SAUER A  07/04/2016 17:06     Short Term Goals Reviewed :   Yes   Physical Therapy Orders :   Physical Therapy Inpatient Additional Treatment Rehab - 07/04/16 17:55:54 EDT, Balance training, Equipment training, Functional training, Gait training, Pain management, Patient education, Stair training, Therapeutic activities, Therapeutic exercises, Transfer training, Wheelchair assessment and management...  PT FIMS - 07/03/16 15:22:00 EDT, Daily     Pain Present :   No actual or suspected pain   PT Therapeutic Activity,Mobility,Balance :   Yes   PT Therapeutic Exercise :   Yes   RYAN, PTA, ROBYN - 07/07/2016 15:28 EDT   Therapeutic Activities/Mobility/Balance   Functional Activity :   Therapeutic Activities  Activity 1:  Bed mobility,  Transfer training; Squat pivot wc<>plinth and wc to bed as well as sit to supine on hospital bed with Mod I.       Performed Date:  07/06/2016  Activity 2:  Gait; Pt ambulated 20' x 2 with RW and spv for safety, cues to decrese jump and to focus on pushing up with BUEs in order to advance LE, able to maintain RLE NWB throughout.       Performed Date:  07/06/2016  Activity 3:  Other: DME; Edu pt on stair negotiation with RW vs crutches. Talked about how DME would get into home as pt want knee scooter which she would not be able to carry up 2 flights of stairs. [employer] will not pay for knee scooter so options discussed on payment out of pocket       Performed Date:  07/06/2016     RYAN, PTA, ROBYN - 07/07/2016 15:28 EDT   PT Therapeutic Activities Grid     Activity 1  Activity 2        Activity :    Bed mobility   Transitional movement           Assist :    Modified independence   Close supervision           Equipment :  Walker           Comment :    w/c used for transfers in and OOB   repeated sit<>stand with close supervision for safety. Pt maintaining NWB right well             RYAN, PTA, ROBYN - 07/07/2016 15:28 EDT  BERNARDINO HACKNEY, ROBYN - 07/07/2016 15:28 EDT        Reassess Mobility :   Yes   RYAN, PTA, ROBYN - 07/07/2016 15:28 EDT   PT Mobility   Mobility Grid   Roll Left :   Rehab Modified independence   Roll Right :   Rehab Modified independence   Roll Supine :   Rehab Modified independence   Supine to Sit :   Rehab Modified independence   Sit to Supine :   Rehab Modified independence   Scooting :   Rehab Modified independence   Sit to Stand :   Close supervision   Stand to Sit :   Close supervision   Transfer Bed to and From Chair :   Rehab Modified independence   Transfer Toilet :   Supervision or setup   [place], PTA, ROBYN - 07/07/2016 15:28 EDT   Amb Ability Varied Surf/Distraction Grid   Distracting Environments :   Does not occur   Curbs :   Does not occur   Stairs :   Does not occur   Ramp :   Does not occur    RYAN, PTA, ROBYN - 07/07/2016 15:28 EDT   Transfer Type :   Squat pivot   Ambulation Device Utilized :   Rolling walker   PT Mobility Reviewed BETHA Chaney BERNARDINO HACKNEY, ROBYN - 07/07/2016 15:28 EDT   Therapeutic Exercise   Therapeutic Exercise RTF :   Therapeutic Exercise    No qualifying data available     BERNARDINO HACKNEY MEDICI - 07/07/2016 15:28 EDT   Therapeutic Exercise Grid     Exercise 1  Exercise 2        Exercise :    Lower extremity strengthening              Position :    Supported sit   Supported stand           Equipment/Device :       Environmental consultant           Repetition/Time :    20   20           Resist or Assist :    verbal and tactile cues fpr proper technique   verbal and tactile cues for proper technique and to slow down.           Comment :    pt performing glute sets, quad sets, LAQ, hip flexion, isometric and active abd/add   pt performing hip flexion right, knee flexion right, hip abd/add right , hip extension with knee flexion and extension right             RYAN, PTA, ROBYN - 07/07/2016 15:28 EDT  BERNARDINO HACKNEY, ROBYN - 07/07/2016 15:28 EDT        Short Term Goals   Bed Mobility Goal Grid     Goal #1          Descriptors :    Roll to right and left, Sit to supine, Supine to sit              Level :  Modified independence              Status :    Goal met              Date Met :    07/04/2016 EDT                BERNARDINO HACKNEY, ROBYN - 07/07/2016 15:28 EDT         Transfers Goal Grid     Goal #1  Goal #2        Descriptors :    Squat pivot transfer, Other: NWB on RLE   Sit to stand, Stand to sit           Level :    Close supervision   Minimal assistance           Device :       Axillary crutches           Status :    Goal met   Goal met           Date Met :    07/04/2016 EDT   07/04/2016 EDT             BERNARDINO HACKNEY, ROBYN - 07/07/2016 15:28 EDT  BERNARDINO HACKNEY, ROBYN - 07/07/2016 15:28 EDT        W/C Management Grid     Goal #1          Descriptors :    Manual wheelchair propulsion indoors              Level :    Modified  independence              Distance :    150 ft              Status :    Goal met              Date Met :    07/04/2016 EDT                BERNARDINO HACKNEY, ROBYN - 07/07/2016 15:28 EDT         PT ST Goals Reviewed :   Chaney BERNARDINO, PTA, ROBYN - 07/07/2016 15:28 EDT   Assessment   PT Impairments or Limitations :   Ambulation deficits, Endurance deficits, Equipment training, Home accessibility/housing, Pain limiting function, Transfer deficits   Barriers to Safe Discharge PT :   Limited family support, Limited social support, Severity of deficits   Discharge Recommendations :   Pt has new job awaiting her in [place], [place] in 10 days, will be living in 3rd floor apt!  She needs a rolling knee walker for use at work and axillary crutches for use at home (and maybe stairs?)     PT Treatment Recommendations :   Pt mobilty safe, with good maintaining of NWB right LE. Pt concerned over equipment needs. Pt given info on equipment as requested . Pt in w/c after treatments with indep. w/c mob level surfaces.Pt mod I in room for transers     RYAN, PTA, ROBYN - 07/07/2016 15:28 EDT   Time Spent With Patient   PT Time In :   9:00 EST   PT Time Out :   9:30 EST   PT Time In 2 :   14:00 EST   PT Time Out 2 :   14:30 EST   PT Therapeutic Exercise Units :   3 units   PT  Therapeutic Exercise Time :   45 minutes   PT ADL TRAINING 15 MN :   1 units   PT ADL Training Time :   15 minutes   PT Total Individual Therapy Time :   60 minutes   PT Total Timed Code Treatment Units :   4 units   PT Total Timed Code Tx Minutes :   60 minutes   PT Total Treatment Time Rehab :   60 minutes   RYAN, PTA, ROBYN - 07/07/2016 15:28 EDT

## 2016-07-07 NOTE — Nursing Note (Signed)
Medication Administration Follow Up-Text       Medication Administration Follow Up Entered On:  07/07/2016 2:05 EDT    Performed On:  07/06/2016 19:24 EDT by Ilsa Iha, RN, Samantha      Intervention Information:     lorazepam  Performed by Dillon Bjork, RN, DEBRA A on 07/06/2016 18:24:00 EDT       lorazepam,1mg   Oral,anxiety       Medication Effectiveness Evaluation   Medication Administration Reason :   Anxiety   Medication Effective :   Yes   Medication Response :   Symptoms improved, Continue to observe for symptoms   Dennie Bible - 07/07/2016 2:05 EDT

## 2016-07-07 NOTE — Progress Notes (Signed)
Functional Indep Measure Scores - Text       Functional Independence Measure Scores Entered On:  07/07/2016 22:50 EDT    Performed On:  07/07/2016 22:49 EDT by Wyvonna PlumHASELDEN, RN, DEBORAH L               Eating Score   Patient's Independence Level With Eating Tasks :   Independent/Modified independence   Independent or Modifications Needed :   Complete independence   Functional Independence Measure Eating :   Complete independence   Wyvonna PlumHASELDEN, RN, DEBORAH L - 07/07/2016 22:49 EDT   Bladder Management Score   Patient's Independence Level with Bladder Management Tasks :   Independent/Modified independence   Independent or Modifications Needed :   Complete independence   Functional Independence Measure Bladder Management :   Complete independence   HASELDEN, RN, DEBORAH L - 07/07/2016 22:49 EDT   Bowel Management Score   Patient's Independence Level with Bowel Management Tasks :   Independent/Modified independence   InM Bowel Mgmt Indep or Modifications :   Complete independence   Functional Independence Measure Bowel Management :   Complete independence   HASELDEN, RN, DEBORAH L - 07/07/2016 22:49 EDT   Transfer Bed/Chair/WC Score   Patient's independence Level with Bed, Chair, Wheelchair Tasks :   Independent/Modified independence   Independent or Modifications Needed :   Assistive device(s), manages independently   Bed, Chair, Wheelchair Transfer :   Modified independence   [place], RN, DEBORAH L - 07/07/2016 22:49 EDT

## 2016-07-07 NOTE — Nursing Note (Signed)
Medication Administration Follow Up-Text       Medication Administration Follow Up Entered On:  07/07/2016 2:05 EDT    Performed On:  07/07/2016 2:09 EDT by Ilsa Iha, RN, Lelon Mast      Intervention Information:     oxycodone  Performed by Ilsa Iha, RN, Samantha on 07/07/2016 01:09:00 EDT       oxycodone,20mg   Oral,moderate pain (4-7)       Medication Effectiveness Evaluation   Medication Administration Reason :   Pain   Medication Effective :   Other: asleep   Medication Response :   Continue to observe for symptoms   Dennie Bible - 07/07/2016 2:05 EDT   POSS Assessment   [place] Opioid Induced Sedation Scale :   S = Sleep, easy to arouse   [place] Opioid Induced Sedation Score :   0  (LOW)    Respirations :   Unlabored   Respiratory Pattern :   Regular   Ilsa Iha, RN, Lelon Mast - 07/07/2016 2:05 EDT

## 2016-07-07 NOTE — Nursing Note (Signed)
Medication Administration Follow Up-Text       Medication Administration Follow Up Entered On:  07/07/2016 11:41 EDT    Performed On:  07/07/2016 8:20 EDT by Dillon Bjork, RN, DEBRA A      Intervention Information:     oxycodone  Performed by Ilsa Iha RN, Samantha on 07/07/2016 07:20:00 EDT       oxycodone,20mg   Oral,moderate pain (4-7)       Medication Effectiveness Evaluation   Medication Administration Reason :   Pain   Medication Effective :   Yes   Medication Response :   Symptoms improved   TUELL, RN, DEBRA A - 07/07/2016 11:41 EDT

## 2016-07-07 NOTE — Progress Notes (Signed)
Functional Indep Measure Scores - Text       Functional Independence Measure Scores Entered On:  07/07/2016 15:28 EDT    Performed On:  07/07/2016 15:26 EDT by Alycia Rossetti, PTA, ROBYN               Transfer Bed/Chair/WC Score   Patient's independence Level with Bed, Chair, Wheelchair Tasks :   Independent/Modified independence   Independent or Modifications Needed :   Wheelchair needed for transfer   Bed, Chair, Wheelchair Transfer :   Modified independence   Lois Huxley - 07/07/2016 15:26 EDT   Walk/Wheelchair Score   Mode of Locomotion Goal :   Wheelchair   Assessed Locomotion in a Wheelchair :   Yes   Distance Traveled in a Wheelchair :   150 ft   Amount of Assistance Necessary :   No assistance necessary   Modified Independence Qualifiers :   Patient cannot independently operate a manual or motorized wheelchair for a minimum of 150 feet; turns around; maneuvers the chair to a table, bed, toilet; negotiates at least a 3 percent grade; and maneuvers on rugs and over door sills   Functional Independence Measure Wheelchair :   Supervision or setup   [place], PTA, ROBYN - 07/07/2016 15:26 EDT   Stairs Score   Stairs Assessed :   Does not occur   Functional Independence Measure Stairs :   Does not occur   RYAN, PTA, ROBYN - 07/07/2016 15:26 EDT   Comprehension Score   Mode of Comprehension :   Auditory   Comprehends Complex or Abstract Information Without Prompting or Cueing :   Yes   Understands Complex or Abstract Directions and Conversations :   At all times   Comprehension Indep Measure Interim :   Complete independence   Darius Bump, ROBYN - 07/07/2016 15:26 EDT   Expression Score   Expression Mode :   Vocal   Expresses Complex or Abstract Information Without Prompting or Cueing :   Yes   Expresses Complex or Abstract Ideas :   Clearly and fluently at all times   Expression Indep Measure Interim :   Complete independence   Lois Huxley - 07/07/2016 15:26 EDT   Social Interaction Score   Interacts Appropriately Without  Supervision :   Yes   Interacts Appropriately :   At all times   Social Interaction Indep Measure Interim :   Complete independence   Darius Bump, ROBYN - 07/07/2016 15:26 EDT   Problem Solving Score   Solves Complex Problems :   Yes   Ability to Solve Complex Problems :   Makes decisions with mild difficulty   Problem Solving Indep Measure Interim :   Modified independence   Darius Bump, ROBYN - 07/07/2016 15:26 EDT   Memory Score   Recognizes, Remembers Routines, and Executes Requests Without Prompting :   Yes   Remembers and Executes Requests :   Consistently without need for repetition   Memory Indep Measure Interim :   Complete independence   Darius Bump, ROBYN - 07/07/2016 15:26 EDT

## 2016-07-07 NOTE — Nursing Note (Signed)
Medication Administration Follow Up-Text       Medication Administration Follow Up Entered On:  07/07/2016 2:06 EDT    Performed On:  07/06/2016 19:56 EDT by Ilsa IhaSnyder, RN, Samantha      Intervention Information:     oxycodone  Performed by Dillon BjorkUELL, RN, DEBRA A on 07/06/2016 18:56:00 EDT       oxycodone,20mg   Oral,moderate pain (4-7)       Medication Effectiveness Evaluation   Medication Administration Reason :   Pain   Medication Effective :   Yes   Medication Response :   Symptoms improved, Continue to observe for symptoms   Dennie BibleSnyder, RN, Samantha - 07/07/2016 2:06 EDT

## 2016-07-07 NOTE — Progress Notes (Signed)
OT Inpatient Daily Documentation - Text       OT Inpatient Daily Documentation Entered On:  07/07/2016 9:27 EDT    Performed On:  07/07/2016 9:25 EDT by Margarite Gouge, Anita Vazquez               Reason for Treatment   Subjective Statement :   Pt agreeable to thearpy. Pt bathing mod I at sink per pt reliable report.      *Reason for Referral :   ADMITTING DIAGNOSES:  1.  Right bimalleolar ankle fracture on August 22nd.  2.  Status post open reduction internal fixation on August 23rd.  3.  History of gastric bypass surgery for obesity.  4.  Anemia    Patient had fall at home and did not get help for 12 hrs. Had surgery and is non wt bearing right ankle. Pt lives by self and is moving to [place] and needs to be mod I to start new job.          3 hr rule  -   NON WT BEARING RIGHT LE  -  FALL RISK     *Chief Complaint :   Decreased mobility secondary to non wt bearing on right LE.  Impacts transfers, ADLs including dressing, bathing, toileting.        Anita Vazquez Vazquez - 07/07/2016 9:25 EDT   Review/Treatments Provided   OT Goals :   OT Short Term Goals    07/05/2016  Bathing Goal #1: Tub transfer; Mod I; Tub bench; Initial; Pt needs to work on this skill for new apartment.     OT Plan :   Treatment Frequency: Daily Performed By: Wyonia Hough, ANN K   07/04/2016  Treatment Duration: 1 Performed By: Eliseo Gum OT, ANN K   07/04/2016  Planned Treatments: Balance training, Basic Activities of Daily Living, Equipment training, Group therapy, HEP, Mobility training, Patient education, Therapeutic activities, Therapeutic exercises, Therapeutic exercises for strengthening and ROM Performed By: Wyonia Hough, ANN K   07/04/2016     Occupational Therapy Orders :   Occupational Therapy Inpatient Additional Treatment Rehab - 07/04/16 12:29:16 EDT, Balance training, Basic Activities of Daily Living, Equipment training, Group therapy, HEP, Mobility training, Patient education, Therapeutic activities, Therapeutic exercises, Therapeutic  exercises for strengthening and ROM, fo...  OT FIMS - 07/03/16 15:22:00 EDT, Daily     Pain Present :   No actual or suspected pain   OT Therapeutic Exercise :   Yes   MARSHALL, COTA, Anita Vazquez - 07/07/2016 9:25 EDT   Therapeutic Exercise   Therapeutic Exercise RTF :   Therapeutic Exercise  Exercise 1:  Lower extremity strengthening; Other: Unsupported/supported stand; Pt standing at table engaging in interactive game to incr LLE strength and balance demo'ing correct NWB'ing status of RLE for incr'd I during ADLs and fxl t/fs.       Performed Date:  07/06/2016  Exercise 2:  Upper extremity strengthening; Sitting in wheelchair; Free weights; all planes/ranges x2sets,x20reps each; #3; to incr BUE strength and endurance for incr'd I during ADLs and fxl t/fs.       Performed Date:  07/06/2016     Carolin Coy - 07/07/2016 9:25 EDT   OT Therapeutic Exercise Grid     Exercise 1  Exercise 2        Exercise :    Upper extremity strengthening   Upper extremity strengthening           Position :  Sitting in wheelchair   Sitting in wheelchair           Equipment/Device :    Pullies   Other: Reaching tree           Repetition/Time :    all planes/ranges x2sets,x20reps each   x2sets each UE           Resistance/Assist :    #6   #2 wrist weight           Comments :    to incr BUE strength and endurance for incr'd I during ADLs and fxl t/fs.   to incr BUE strength and endurance for incr'd I during ADLs and fxl t/fs.             Anita MiloMARSHALL, COTA, Anita Vazquez - 07/07/2016 9:25 EDT  Carolin CoyMARSHALL, COTA, Anita Vazquez - 07/07/2016 9:25 EDT        Education   Occupational Therapy Education Grid   Activity of Daily Living Training :   Bristol-Myers SquibbVerbalizes understanding, Demonstrates   Bed Positioning :   Demonstrates, Verbalizes understanding   Bed to Chair Transfers :   Bristol-Myers SquibbVerbalizes understanding, Production designer, theatre/television/filmDemonstrates   Body Mechanics :   Tax inspectorVerbalizes understanding, Paediatric nurseDemonstrates   Exercise Program :   Brink's CompanyVerbalizes understanding, Demonstrates   MARSHALL, COTA,  Anita Vazquez - 07/07/2016 9:25 EDT   Assessment   OT Impairments or Limitations :   Balance deficits, Other: wt bearing status   Barriers to Safe Discharge OT :   Limited family support, Other: Non Wt bearing status ;[place]   OT Treatment Recommendations :   Pt tolerating tx well this am. Pt dressed and washed upon therapist arrival.      Carolin CoyMARSHALL, COTA, Anita Vazquez - 07/07/2016 9:25 EDT   Time Spent With Patient   OT Treatment Time Comment :   Pt participating in 30 minute dynamic standing group standing x5sets,x4-535mins each set to incr dynamic standing balance and endurance for incr'd I during ADLs and fxl t/fs.   Anita MiloMARSHALL, COTA, Anita Vazquez - 07/07/2016 13:42 EDT   OT Time In :   7:30 EST   OT Time Out :   8:30 EST   OT Time In 2 :   13:00 EST   OT Time Out 2 :   13:30 EST   OT Group Therapy Units :   30 Minutes   OT Group Therapy Time :   30 minutes   OT Therapeutic Exercise Units :   4 units   OT Therapeutic Exercise Time :   60 minutes   OT Total Individual Therapy Time :   60 minutes   OT Total Group Therapy Time :   30 minutes   OT Total Timed Code Treatment Units :   4 units   OT Total Timed Code Treatment Minutes :   60 minutes   OT Total Treatment Time Rehab :   60 minutes   MARSHALL, COTA, Anita Vazquez - 07/07/2016 9:25 EDT

## 2016-07-07 NOTE — Progress Notes (Signed)
Functional Indep Measure Scores - Text       Functional Independence Measure Scores Entered On:  07/07/2016 9:25 EDT    Performed On:  07/07/2016 9:24 EDT by Margarite Gouge, RACHEL C               Transfer Bed/Chair/WC Score   Patient's independence Level with Bed, Chair, Wheelchair Tasks :   Independent/Modified independence   Independent or Modifications Needed :   Assistive device(s), manages independently   Bed, Chair, Wheelchair Transfer :   Modified independence   Carolin Coy - 07/07/2016 9:24 EDT   Comprehension Score   Mode of Comprehension :   Auditory   Comprehends Complex or Abstract Information Without Prompting or Cueing :   Yes   Understands Complex or Abstract Directions and Conversations :   At all times   Comprehension Indep Measure Interim :   Complete independence   MARSHALL, COTA, RACHEL C - 07/07/2016 9:24 EDT   Expression Score   Expression Mode :   Vocal   Expresses Complex or Abstract Information Without Prompting or Cueing :   Yes   Expresses Complex or Abstract Ideas :   Clearly and fluently at all times   Expression Indep Measure Interim :   Complete independence   Dionne Milo C - 07/07/2016 9:24 EDT   Social Interaction Score   Interacts Appropriately Without Supervision :   Yes   Interacts Appropriately :   At all times   Social Interaction Indep Measure Interim :   Complete independence   Margarite Gouge, RACHEL C - 07/07/2016 9:24 EDT   Problem Solving Score   Solves Complex Problems :   Yes   Ability to Solve Complex Problems :   Makes decisions with mild difficulty   Problem Solving Indep Measure Interim :   Modified independence   MARSHALL, COTA, RACHEL C - 07/07/2016 9:24 EDT   Memory Score   Recognizes, Remembers Routines, and Executes Requests Without Prompting :   Yes   Remembers and Executes Requests :   Consistently without need for repetition   Memory Indep Measure Interim :   Complete independence   MARSHALL, COTA, RACHEL C - 07/07/2016 9:24 EDT

## 2016-07-08 NOTE — Progress Notes (Signed)
OT Time Spent With Patient - Text       OT Time Spent With Patient Entered On:  07/08/2016 13:45 EDT    Performed On:  07/08/2016 13:45 EDT by Gaynell Face, COTA, RACHEL C               Time Spent With Patient   OT Treatment Time Comment :   Pt completing #6 sanderbox in all directions x1set,x40reps each to incr BUE strength and endurance for incr'd I during ADLs and fxl t/fs.   MARSHALL, COTA, RACHEL C - 07/08/2016 14:27 EDT   OT Time In :   14:00 EST   OT Time Out :   14:30 EST   OT Therapeutic Exercise Units :   2 units   OT Therapeutic Exercise Time :   30 minutes   OT Total Individual Therapy Time :   30 minutes   OT Total Timed Code Treatment Units :   2 units   OT Total Timed Code Treatment Minutes :   30 minutes   OT Total Treatment Time Rehab :   30 minutes   MARSHALL, COTA, RACHEL C - 07/08/2016 13:45 EDT

## 2016-07-08 NOTE — Progress Notes (Signed)
Functional Indep Measure Scores - Text       Functional Independence Measure Scores Entered On:  07/08/2016 9:08 EDT    Performed On:  07/08/2016 9:05 EDT by Pricilla Holm, Duwayne Heck               Comprehension Score   Mode of Comprehension :   Auditory   Marisa Cyphers - 07/08/2016 9:05 EDT   Expression Score   Expression Mode :   Vocal   Expresses Complex or Abstract Information Without Prompting or Cueing :   Yes   Expresses Complex or Abstract Ideas :   Clearly and fluently at all times   Expression Indep Measure Interim :   Complete independence   Marisa Cyphers - 07/08/2016 9:05 EDT   Social Interaction Score   Interacts Appropriately Without Supervision :   Yes   Interacts Appropriately :   At all times   Social Interaction Indep Measure Interim :   Complete independence   Marisa Cyphers - 07/08/2016 9:05 EDT   Problem Solving Score   Solves Complex Problems :   Yes   Ability to Solve Complex Problems :   Makes decisions with mild difficulty   Problem Solving Indep Measure Interim :   Modified independence   Marisa Cyphers - 07/08/2016 9:05 EDT   Memory Score   Recognizes, Remembers Routines, and Executes Requests Without Prompting :   Yes   Remembers and Executes Requests :   Consistently without need for repetition   Memory Indep Measure Interim :   Complete independence   Marisa Cyphers - 07/08/2016 9:05 EDT

## 2016-07-08 NOTE — Progress Notes (Signed)
Functional Indep Measure Scores - Text       Functional Independence Measure Scores Entered On:  07/08/2016 12:32 EDT    Performed On:  07/08/2016 12:31 EDT by Milon Dikes, RN, Tonye Becket               Eating Score   Patient's Independence Level With Eating Tasks :   Independent/Modified independence   Independent or Modifications Needed :   Complete independence   Functional Independence Measure Eating :   Complete independence   Kemmerlin, RN, Tonye Becket - 07/08/2016 12:31 EDT   Toileting Score   Patient's Independence Level with Toileting Tasks :   Independent/Modified independence   Independent or Modifications Needed :   Assistive device(s), manages independently   Toileting :   Modified independence   Kemmerlin, RN, Tonye Becket - 07/08/2016 12:31 EDT   Bladder Management Score   Patient's Independence Level with Bladder Management Tasks :   Independent/Modified independence   Independent or Modifications Needed :   Urinal/Bedpan/Commode/Elevated seat, manages independently   Functional Independence Measure Bladder Management :   Modified independence   Kemmerlin, RN, Tonye Becket - 07/08/2016 12:31 EDT   Bowel Management Score   Patient's Independence Level with Bowel Management Tasks :   Independent/Modified independence   InM Bowel Mgmt Indep or Modifications :   Assistive device(s), manages independently   Functional Independence Measure Bowel Management :   Modified independence   Kemmerlin, RN, Tonye Becket - 07/08/2016 12:31 EDT   Transfer Bed/Chair/WC Score   Patient's independence Level with Bed, Chair, Wheelchair Tasks :   Independent/Modified independence   Independent or Modifications Needed :   Sports administrator, Chair, Wheelchair Transfer :   Modified independence   Edmund, RN, Tonye Becket - 07/08/2016 12:31 EDT

## 2016-07-08 NOTE — Nursing Note (Signed)
Medication Administration Follow Up-Text       Medication Administration Follow Up Entered On:  07/08/2016 11:36 EDT    Performed On:  07/08/2016 2:50 EDT by Milon Dikes, RN, Tonye Becket      Intervention Information:     oxycodone  Performed by Doyce Loose on 07/08/2016 01:50:00 EDT       oxycodone,20mg   Oral,moderate pain (4-7)       Medication Effectiveness Evaluation   Medication Administration Reason :   Pain   Medication Effective :   Yes   Medication Response :   Symptoms improved   Kemmerlin, RN, Tonye Becket - 07/08/2016 11:36 EDT

## 2016-07-08 NOTE — Progress Notes (Signed)
OT Inpatient Daily Documentation - Text       OT Inpatient Daily Documentation Entered On:  07/08/2016 9:14 EDT    Performed On:  07/08/2016 9:08 EDT by Marisa CyphersGRIGIONI, COTA, DANIELLE               Reason for Treatment   *Reason for Referral :   ADMITTING DIAGNOSES:  1.  Right bimalleolar ankle fracture on August 22nd.  2.  Status post open reduction internal fixation on August 23rd.  3.  History of gastric bypass surgery for obesity.  4.  Anemia    Patient had fall at home and did not get help for 12 hrs. Had surgery and is non wt bearing right ankle. Pt lives by self and [place] and needs to be mod I to start new job.          3 hr rule  -   NON WT BEARING RIGHT LE  -  FALL RISK     *Chief Complaint :   Decreased mobility secondary to non wt bearing on right LE.  Impacts transfers, ADLs including dressing, bathing, toileting.        Marisa CyphersGRIGIONI, COTA, DANIELLE - 07/08/2016 9:08 EDT   Review/Treatments Provided   OT Goals :   OT Short Term Goals    07/05/2016  Bathing Goal #1: Tub transfer; Mod I; Tub bench; Initial; Pt needs to work on this skill for new apartment.     OT Plan :   Treatment Frequency: Daily Performed By: Wyonia HoughBENTON, OT, ANN K   07/04/2016  Treatment Duration: 1 Performed By: Eliseo GumBENTON, OT, ANN K   07/04/2016  Planned Treatments: Balance training, Basic Activities of Daily Living, Equipment training, Group therapy, HEP, Mobility training, Patient education, Therapeutic activities, Therapeutic exercises, Therapeutic exercises for strengthening and ROM Performed By: Wyonia HoughBENTON, OT, ANN K   07/04/2016     Occupational Therapy Orders :   Occupational Therapy Inpatient Additional Treatment Rehab - 07/04/16 12:29:16 EDT, Balance training, Basic Activities of Daily Living, Equipment training, Group therapy, HEP, Mobility training, Patient education, Therapeutic activities, Therapeutic exercises, Therapeutic exercises for strengthening and ROM, fo...  OT FIMS - 07/03/16 15:22:00 EDT, Daily     Pain Present :   No  actual or suspected pain   Marisa CyphersGRIGIONI, COTA, DANIELLE - 07/08/2016 9:08 EDT   OT Basic ADL   Basic ADL Grid   Eating :   Modified independence   Grooming :   Complete independence   Bathing :   Modified independence   UE Dressing :   Complete independence   LE Dressing :   Contact guard assistance   Toileting :   Modified independence   Transfer Toilet :   Modified independence   Shower Transfer :   Supervision or setup   Marisa CyphersGRIGIONI, COTA, DANIELLE - 07/08/2016 9:08 EDT   ADL Comments :   Shower ADL completed 8/30. Mod I for eating 2* dentures. Independent with grooming task. Bathing in shower utilizing grab bars/shower seat. Independent for UE dressing task. Mod I for toileting/toilet transfer/and bed<>wc transfer utilizing grab bars or rw. Supervision for shower transfer 2* dec'd standing balance on wet surface.        Marisa CyphersGRIGIONI, COTA, DANIELLE - 07/08/2016 9:26 EDT     OT ADL Reviewed :   Wess BottsYes   GRIGIONI, COTA, DANIELLE - 07/08/2016 9:08 EDT   Assessment   OT Impairments or Limitations :   Balance deficits, Other: wt bearing status   Barriers  to Safe Discharge OT :   Limited family support, Other: Non Wt bearing status ;Patient moving to [facility] for new job next week.   OT Discharge Recommendations :   Home with DME equipment.  Patient would probably benenfit from Knee walker.      OT Treatment Recommendations :   Pt tolerated treatment well. Pt left in wc, call bell within reach.      Marisa Cyphers - 07/08/2016 9:08 EDT   Time Spent With Patient   OT Time In :   8:30 EST   OT Time Out :   9:30 EST   OT ADL TRAINING 15 MIN :   4    OT ADL Training Minutes :   60 minutes   OT Total Individual Therapy Time :   60 minutes   OT Total Timed Code Treatment Units :   4 units   OT Total Timed Code Treatment Minutes :   60 minutes   OT Total Treatment Time Rehab :   60 minutes   Marisa Cyphers - 07/08/2016 9:08 EDT

## 2016-07-08 NOTE — Progress Notes (Signed)
Functional Indep Measure Scores - Text       Functional Independence Measure Scores Entered On:  07/08/2016 22:20 EDT    Performed On:  07/08/2016 22:19 EDT by Katheren Puller, RN, Matthew               Eating Score   Patient's Independence Level With Eating Tasks :   Setup/Supervision   Supervision or Setup Needed :   Gather equipment   Functional Independence Measure Eating :   Supervision or setup   Salvatore Decent - 07/08/2016 22:19 EDT   Toileting Score   Patient's Independence Level with Toileting Tasks :   Setup/Supervision   Supervision or Setup Needed :   Gather equipment   Toileting :   Supervision or setup   Salvatore Decent - 07/08/2016 22:19 EDT   Bladder Management Score   Patient's Independence Level with Bladder Management Tasks :   Setup/Supervision   InM Bladder Mgmt Supervision,Setup Needs :   Empty device, Gather equipment, Setup device   Functional Independence Measure Bladder Management :   Supervision or setup   Salvatore Decent - 07/08/2016 22:19 EDT

## 2016-07-08 NOTE — Nursing Note (Signed)
Medication Administration Follow Up-Text       Medication Administration Follow Up Entered On:  07/08/2016 11:36 EDT    Performed On:  07/08/2016 8:44 EDT by Milon Dikes, RN, Tonye Becket      Intervention Information:     oxycodone  Performed by Milon Dikes, RN, Tonye Becket on 07/08/2016 07:44:00 EDT       oxycodone,20mg   Oral,moderate pain (4-7)       Medication Effectiveness Evaluation   Medication Administration Reason :   Pain   Medication Effective :   Yes   Medication Response :   Symptoms improved   Kemmerlin, RN, Tonye Becket - 07/08/2016 11:36 EDT

## 2016-07-08 NOTE — Progress Notes (Signed)
PT Inpatient Daily Documentation - Text       PT Inpatient Daily Documentation Entered On:  07/08/2016 14:30 EDT    Performed On:  07/08/2016 14:30 EDT by Kevan Ny PTA, Melynda Ripple               Reason for Treatment   Subjective Statement :   pt states wanting to try crutches. states being fearfull of knee crutch that has been ordeted. stated cont anxiety c dc isseus     *Reason for Referral :     Discussed dc to hotel for safey     R bimalleolar fx s/p ORIF and cast    PMH: gastric bypass, anemia    Precautions: NWB  on RLE, fall    schedule: 3 hr.     *Chief Complaint :   9/10 pain in R ankle in AM, 10/10 in PM (already asked but hadn't received meds)     GATES, PTA, Melynda Ripple - 07/08/2016 15:17 EDT   Review/Treatments Provided   PT Goals :   PT Short Term Goals    07/07/2016     PT Plan :   Treatment Frequency:  Mo/Tu/We/Th/Fr/Sa (modified)   Performed By: Ivor Messier A  07/04/2016 17:06  Treatment Duration: 1 Performed By: Ivor Messier A  07/04/2016 17:06  Planned Treatments: Balance training, Equipment training, Functional training, Gait training, Pain management, Patient education, Stair training, Therapeutic activities, Therapeutic exercises, Transfer training, Wheelchair assessment and management Performed By: Ivor Messier A  07/04/2016 17:06     Short Term Goals Reviewed :   Yes   PT Long Term Goals Reviewed :   Yes   Physical Therapy Orders :   Physical Therapy Inpatient Additional Treatment Rehab - 07/04/16 17:55:54 EDT, Balance training, Equipment training, Functional training, Gait training, Pain management, Patient education, Stair training, Therapeutic activities, Therapeutic exercises, Transfer training, Wheelchair assessment and management...  PT FIMS - 07/03/16 15:22:00 EDT, Daily     Pain Present :   Yes actual or suspected pain   PT Therapeutic Activity,Mobility,Balance :   Yes   PT Therapeutic Exercise :   Yes   PT Wheelchair Management :   Yes   GATES, PTA, Melynda Ripple -  07/08/2016 15:17 EDT   Pain Assessment   Duration :   in ankle   Self Report Pain :   Numeric rating scale   GATES, PTA, Melynda Ripple - 07/08/2016 15:17 EDT   Therapeutic Activities/Mobility/Balance   Functional Activity :   Therapeutic Activities  Activity 1:  Bed mobility; Mod I; w/c used for transfers in and OOB       Performed Date:  07/07/2016  Activity 2:  Transitional movement; Close S; Walker; repeated sit<>stand with close supervision for safety. Pt maintaining NWB right well       Performed Date:  07/07/2016  Activity 3:  Other: DME; Edu pt on stair negotiation with RW vs crutches. Talked about how DME would get into home as pt want knee scooter which she would not be able to carry up 2 flights of stairs. [employer] will not pay for knee scooter so options discussed on payment out of pocket       Performed Date:  07/06/2016     Alger Memos - 07/08/2016 15:17 EDT   PT Therapeutic Activities Grid     Activity 3  Activity 4  Activity 1  Activity 2    Activity :  Gait, Transfer training   Gait     Assist :          Moderate assistance   Close supervision     Equipment :          Crutches, Parallel bars   Walker     Response :          Other: pt anxiety c act   Decreased endurance, Increased activation of targeted muscle group(s)     Comment :    initated training c quad cane, for steps. pt worked on steping up \ down 4 inc block x 5 bouts in \\bars. pt c min assist via cga, however fatigued.  pt discussed progressing to use of cane c amb c ue and \\ bars in next. hoever initated discussion of dc    plan to [facility]. pt encouraged to follow thouth with this plan for max safey    pt ed on tx sit <>stand c crutches in \\bars, pt performed x 2 bouts. pt ed on amb in \\bars . pt amb ~ half length of \\ bars forward turened and back to wc. pt c min \\mod  assist for bal. pt stating not wanting to cont crutch trainig.   pt amb 30 feet c rw, pt c cont ed on imprvoed tech to facilitae lifting c bue  and dec jumping  \hoping motion. pt siting at ~ 30 feet.  Due to conversaton and distractons. pt ed on to perfomed act at max levels to alow for porgression of enduraecn with  (Comment: in safe limits of gym. pt stats likely 400 feet to walk form parked car to condo. Alger Memos - 07/08/2016 15:06 EDT] )      Jamse Belfast M - 07/08/2016 15:06 EDT  GATES, PTA, Alesia Banda M - 07/08/2016 15:06 EDT  GATES, PTA, Melynda Ripple - 07/08/2016 15:17 EDT  GATES, PTA, Melynda Ripple - 07/08/2016 15:17 EDT      Reassess Mobility :   Yes   Reassess Advanced Mobility :   Yes   Alger Memos - 07/08/2016 15:17 EDT   PT Mobility   PT Mobility Reviewed :   Yes   GATES, PTA, LEANNE M - 07/08/2016 15:06 EDT   GATES, PTA, Melynda Ripple - 07/08/2016 15:06 EDT   Mobility Grid   Roll Left :   Rehab Modified independence   Roll Right :   Rehab Modified independence   Roll Supine :   Rehab Modified independence   Supine to Sit :   Rehab Modified independence   Sit to Supine :   Rehab Modified independence   Scooting :   Rehab Modified independence   Sit to Stand :   Close supervision   Stand to Sit :   Close supervision   Transfer Bed to and From Chair :   Rehab Modified independence   Transfer Toilet :   Supervision or setup   GATES, Lillia Mountain - 07/08/2016 15:17 EDT   Functional Mobility Details :   Pt c/o blurred vision in PM, unable to assess gait w crutches (nurse notified)   GATES, PTA, Melynda Ripple - 07/08/2016 15:17 EDT   Amb Ability Varied Surf/Distraction Grid   Level Surfaces :   Close supervision   Uneven Surfaces :   Does not occur   Distracting Environments :   Does not occur   Curbs :   Does not occur   Stairs :   Does not  occur   Ramp :   Does not occur   GATES, PTA, Melynda Ripple - 07/08/2016 15:17 EDT   Transfer Type :   Squat pivot   Ambulation Device Utilized :   Rolling walker   Distance Level Surface :   20 ft   Other Mobility Training :   rolling stool supporting RLE in // bars   Alger Memos - 07/08/2016 15:17 EDT   Therapeutic Exercise    Therapeutic Exercise RTF :   Therapeutic Exercise  Exercise 1:  Lower extremity strengthening; Supported sit; 20; verbal and tactile cues fpr proper technique; pt performing glute sets, quad sets, LAQ, hip flexion, isometric and active abd/add       Performed Date:  07/07/2016  Exercise 2:  Supported stand; Walker; 20; verbal and tactile cues for proper technique and to slow down.; pt performing hip flexion right, knee flexion right, hip abd/add right , hip extension with knee flexion and extension right       Performed Date:  07/07/2016     Alger Memos - 07/08/2016 15:17 EDT   Therapeutic Exercise Grid     Exercise 1          Comment :    pt stating isseus c steps. pt ed  on isseus and concerns c attemtps to bump up \\down  3 flighs. pt agrees on need to work on alt solutions.                Alger Memos - 07/08/2016 15:17 EDT         WC Management   Type of Wheelchair :   Manual wheelchair   Wheelchair Details :   pt propelling wc form one act to anothera at mod (I) \ near mod (I) level   GATES, PTA, Melynda Ripple - 07/08/2016 15:17 EDT   Short Term Goals   Bed Mobility Goal Grid     Goal #1          Descriptors :    Roll to right and left, Sit to supine, Supine to sit              Level :    Modified independence              Status :    Goal met              Date Met :    07/04/2016 EDT                GATES, PTA, Melynda Ripple - 07/08/2016 15:17 EDT         Transfers Goal Grid     Goal #1  Goal #2        Descriptors :    Squat pivot transfer, Other: NWB on RLE   Sit to stand, Stand to sit           Level :    Close supervision   Minimal assistance           Device :       Axillary crutches           Status :    Goal met   Goal met           Date Met :    07/04/2016 EDT   07/04/2016 EDT             GATES, PTA, Alesia Banda M - 07/08/2016 15:17 EDT  GATES, PTA,  Melynda Ripple - 07/08/2016 15:17 EDT        W/C Management Grid     Goal #1          Descriptors :    Manual wheelchair propulsion indoors              Level :     Modified independence              Distance :    150 ft              Status :    Goal met              Date Met :    07/04/2016 EDT                GATES, PTA, Melynda Ripple - 07/08/2016 15:17 EDT         PT ST Goals Reviewed :   Vira Browns - 07/08/2016 15:17 EDT   Long Term Goals   PT Patient,Caregiver Goal :   Be able to go to my new job in [place], [place] in 10 days.   Alger Memos - 07/08/2016 15:17 EDT   Mobility Goals Grid   Bed, Chair, Wheelchair Goal :   Modified independence   Toilet Transfer Goal :   Modified independence   Ambulation Level Surfaces Goal :   Modified independence   Wheelchair Mobility Level Surfaces Goal :   Modified independence   Stairs Ambulation Assistance Level Goal :   Modified independence   GATES, PTA, LEANNE M - 07/08/2016 15:17 EDT   PT LTG Reconcilation :   Ambulation NWB on RLE w both rolling knee walker and axillary crutches.   Type of Wheelchair Goal :   Manual wheelchair   Mode of Locomotion Goal :   Wheelchair   PT LT Goals Reviewed :   Yes   GATES, PTA, Melynda Ripple - 07/08/2016 15:17 EDT   Assessment   PT Impairments or Limitations :   Ambulation deficits, Endurance deficits, Equipment training, Home accessibility/housing, Pain limiting function, Transfer deficits   Barriers to Safe Discharge PT :   Limited family support, Limited social support, Severity of deficits   Discharge Recommendations :   Pt has new job awaiting her in [place],  in 10 days, will be living in 3rd floor apt!  She needs a rolling knee walker for use at work and axillary crutches for use at home (and maybe stairs?)     PT Treatment Recommendations :   pt c good effot c all act.  pt however cont to have many barriers to home \\dc .  pt would cont to benfit from skilled pt to assit c safey c addressing dc barriers and to inc PT safey at dc.  Im pm session pt discussion on dc plan to a hotel, pt encouraged to follow through with this plan for max safey at dc     GATES, PTA, Melynda Ripple -  07/08/2016 15:17 EDT   Time Spent With Patient   PT TIme in 3 :   14:30 EST   PT Time Out 3 :   15:00 EST   PT ADL TRAINING 15 MN :   4 units   PT ADL Training Time :   60 minutes   PT Total Individual Therapy Time :   60 minutes   PT Total Timed Code Treatment Units :   4 units   PT  Total Timed Code Tx Minutes :   60 minutes   GATES, PTA, LEANNE M - 07/08/2016 15:06 EDT   PT Time In :   11:00 EST   PT Time Out :   11:30 EST   PT Time In 2 :   13:30 EST   PT Time Out 2 :   14:00 EST   PT Group Therapy Units :   30 Minutes   PT Group Therapy Time :   30 minutes   PT Total Group Therapy Time :   30 minutes   PT Treatment Time Comment :   pt partcipated in 30 min standing \ sitting le ex group, to facilitae strength, eduracen, standing bal and assist c progression towarsds fxn goals. pt c vc to abide by precuations   PT Total Untimed Code Treatment Minutes :   30 minutes   PT Total Treatment Time Rehab :   90 minutes   GATES, PTA, Melynda Ripple - 07/08/2016 15:17 EDT

## 2016-07-09 NOTE — Progress Notes (Signed)
Functional Indep Measure Scores - Text       Functional Independence Measure Scores Entered On:  07/09/2016 21:59 EDT    Performed On:  07/09/2016 21:59 EDT by Katheren Pullerausch, RN, Matthew               Eating Score   Patient's Independence Level With Eating Tasks :   Setup/Supervision   Supervision or Setup Needed :   Gather equipment   Functional Independence Measure Eating :   Supervision or setup   Salvatore DecentRausch, RN, Matthew - 07/09/2016 21:59 EDT   Toileting Score   Patient's Independence Level with Toileting Tasks :   Setup/Supervision   Supervision or Setup Needed :   Gather equipment   Toileting :   Supervision or setup   Salvatore DecentRausch, RN, Matthew - 07/09/2016 21:59 EDT   Bladder Management Score   Patient's Independence Level with Bladder Management Tasks :   Setup/Supervision   InM Bladder Mgmt Supervision,Setup Needs :   Empty device, Gather equipment, Setup device   Functional Independence Measure Bladder Management :   Supervision or setup   Salvatore DecentRausch, RN, Matthew - 07/09/2016 21:59 EDT

## 2016-07-09 NOTE — Nursing Note (Signed)
Medication Administration Follow Up-Text       Medication Administration Follow Up Entered On:  07/09/2016 4:27 EDT    Performed On:  07/08/2016 20:49 EDT by Katheren Puller, RN, Matthew      Intervention Information:     oxycodone  Performed by Katrinka Blazing, RN, KRISTINA K on 07/08/2016 19:49:00 EDT       oxycodone,20mg   Oral,moderate pain (4-7)       Medication Effectiveness Evaluation   Medication Administration Reason :   Pain   Medication Effective :   Yes   Medication Response :   Symptoms improved   Salvatore Decent - 07/09/2016 4:27 EDT

## 2016-07-09 NOTE — Progress Notes (Signed)
OT Inpatient Daily Documentation - Text       OT Inpatient Daily Documentation Entered On:  07/09/2016 14:50 EDT    Performed On:  07/09/2016 14:42 EDT by Rosezena Sensor, ERLENE L               Reason for Treatment   *Reason for Referral :   ADMITTING DIAGNOSES:  1.  Right bimalleolar ankle fracture on August 22nd.  2.  Status post open reduction internal fixation on August 23rd.  3.  History of gastric bypass surgery for obesity.  4.  Anemia    Patient had fall at home and did not get help for 12 hrs. Had surgery and is non wt bearing right ankle. Pt lives by self and is moving to [place] and needs to be mod I to start new job.          3 hr rule  -   NON WT BEARING RIGHT LE  -  FALL RISK     *Chief Complaint :   Decreased mobility secondary to non wt bearing on right LE.  Impacts transfers, ADLs including dressing, bathing, toileting.        Rosezena Sensor, ERLENE L - 07/09/2016 14:42 EDT   Review/Treatments Provided   OT Goals :   OT Short Term Goals    07/05/2016  Bathing Goal #1: Tub transfer; Mod I; Tub bench; Initial; Pt needs to work on this skill for new apartment.     OT Plan :   Treatment Frequency: Daily Performed By: Wyonia Hough, ANN K   07/04/2016  Treatment Duration: 1 Performed By: Eliseo Gum OT, ANN K   07/04/2016  Planned Treatments: Balance training, Basic Activities of Daily Living, Equipment training, Group therapy, HEP, Mobility training, Patient education, Therapeutic activities, Therapeutic exercises, Therapeutic exercises for strengthening and ROM Performed By: Wyonia Hough, ANN K   07/04/2016     OT Update Plan :   Yes   Occupational Therapy Orders :   Occupational Therapy Inpatient Additional Treatment Rehab - 07/04/16 12:29:16 EDT, Balance training, Basic Activities of Daily Living, Equipment training, Group therapy, HEP, Mobility training, Patient education, Therapeutic activities, Therapeutic exercises, Therapeutic exercises for strengthening and ROM, fo...  OT FIMS - 07/03/16 15:22:00 EDT, Daily      Pain Present :   Yes actual or suspected pain   OT Therapeutic Activity,Mobility,Balance :   Yes   OT Therapeutic Exercise :   Yes   Rosezena Sensor, ERLENE L - 07/09/2016 14:42 EDT   Pain Assessment   Pain Location :   Foot   Laterality :   Right   Self Report Pain :   Numeric rating scale   Rosezena Sensor, ERLENE L - 07/09/2016 14:42 EDT   Therapeutic Activities   OT Therapeutic Activities RTF :   Therapeutic Activities  Activity 1:  Transitional movement; Mod I; Wheelchair; Mod I lateral scoot t/f bed>w/c.       Performed Date:  07/06/2016     Larwance Rote - 07/09/2016 14:42 EDT   OT Therapeutic Activities Grid     Activity 1          Activities :    Tolerance to upright position, Transitional movement              Comments :    tolerated standing for aprox 5 mins duration req'g short rest break; practiced transfers to max fx'l safety and endurance  Rosezena Sensor, ERLENE L - 07/09/2016 14:42 EDT         Therapeutic Exercise   Therapeutic Exercise RTF :   Therapeutic Exercise  Exercise 1:  Upper extremity strengthening; Sitting in wheelchair; Pullies; all planes/ranges x2sets,x20reps each; #6; to incr BUE strength and endurance for incr'd I during ADLs and fxl t/fs.       Performed Date:  07/07/2016  Exercise 2:  Upper extremity strengthening; Sitting in wheelchair; Other: Reaching tree; x2sets each UE; #2 wrist weight; to incr BUE strength and endurance for incr'd I during ADLs and fxl t/fs.       Performed Date:  07/07/2016     Larwance Rote - 07/09/2016 14:42 EDT   OT Therapeutic Exercise Grid     Exercise 1          Exercise :    Upper extremity strengthening              Position :    Supported stand              Equipment/Device :    [employer], [employer]ra[employer]ty              Comments :    tolerated UB ex program all jts to max UB strength and endurance                Larwance Rote - 07/09/2016 14:42 EDT         Education   Responsible Learner Present for Session :   No   Teaching Method  :   Demonstration, Explanation   Larwance Rote - 07/09/2016 14:42 EDT   Occupational Therapy Education Grid   Bed to Chair Transfers :   [employer] understanding, Paediatric nurse   Exercise Program :   TEFL teacher understanding, Demonstrates   Home Safety :   TEFL teacher understanding, Needs practice/supervision   Plan of Care :   [employer] fast streamer :   [employer], Demonstrates   Larwance Rote - 07/09/2016 14:42 EDT   OT Additional Education :   practiced positioning in stance for IADL's and ADL's in the home setting; pt ed for safety in stance and during basic transfers     Barnabas Harries L - 07/09/2016 14:42 EDT   Assessment   OT Impairments or Limitations :   Balance deficits, Other: wt bearing status   Barriers to Safe Discharge OT :   Limited family support, Other: Non Wt bearing status ;Patient moving to [facility] for new job next week.   OT Discharge Recommendations :   Home with DME equipment.  Patient would probably benenfit from Knee walker.      Rosezena Sensor, ERLENE L - 07/09/2016 14:42 EDT   Plan   Frequency :   Daily   Duration :   1    Duration Unit :   Weeks   Estimated Hours Per Day :   2-3 hrs per day   Planned Treatments :   Balance training, Basic Activities of Daily Living, Equipment training, Group therapy, HEP, Mobility training, Patient education, Therapeutic activities, Therapeutic exercises, Therapeutic exercises for strengthening and ROM   Treatment Plan/Goals Established With Patient/Caregiver :   Yes   Discharge Recommendations :   Home with DME equipment.  Patient would probably benenfit from Knee walker.      Larwance Rote - 07/09/2016 14:42 EDT   Time Spent With Patient   OT  Time In :   12:30 EST   OT Time Out :   13:30 EST   OT Therapeutic Exercise Units :   4 units   OT Therapeutic Exercise Time :   60 minutes   OT Total Individual Therapy Time :   60 minutes   OT Total Timed Code Treatment Units :   4 units   OT  Total Timed Code Treatment Minutes :   60 minutes   OT Total Treatment Time Rehab :   60 minutes   CRANK, COTA, ERLENE L - 07/09/2016 14:42 EDT

## 2016-07-09 NOTE — Progress Notes (Signed)
Functional Indep Measure Scores - Text       Functional Independence Measure Scores Entered On:  07/09/2016 15:27 EDT    Performed On:  07/09/2016 15:26 EDT by Alycia Rossetti, PTA, ROBYN               Transfer Bed/Chair/WC Score   Patient's independence Level with Bed, Chair, Wheelchair Tasks :   Independent/Modified independence   Independent or Modifications Needed :   Wheelchair needed for transfer   Bed, Chair, Wheelchair Transfer :   Modified independence   Lois Huxley - 07/09/2016 15:26 EDT   Walk/Wheelchair Score   Mode of Locomotion Goal :   Wheelchair   Assessed Locomotion in a Wheelchair :   Yes   Distance Traveled in a Wheelchair :   150 ft   Amount of Assistance Necessary :   No assistance necessary   Modified Independence Qualifiers :   Patient cannot independently operate a manual or motorized wheelchair for a minimum of 150 feet; turns around; maneuvers the chair to a table, bed, toilet; negotiates at least a 3 percent grade; and maneuvers on rugs and over door sills   Functional Independence Measure Wheelchair :   Supervision or setup   [place] - 07/09/2016 15:26 EDT   Stairs Score   Stairs Assessed :   Does not occur   Functional Independence Measure Stairs :   Does not occur   RYAN, PTA, ROBYN - 07/09/2016 15:26 EDT   Comprehension Score   Mode of Comprehension :   Auditory   Comprehends Complex or Abstract Information Without Prompting or Cueing :   Yes   Understands Complex or Abstract Directions and Conversations :   Mild difficulty   Comprehension Indep Measure Interim :   Modified independence   RYAN, PTA, ROBYN - 07/09/2016 15:26 EDT   Expression Score   Expression Mode :   Vocal   Expresses Complex or Abstract Information Without Prompting or Cueing :   Yes   Expresses Complex or Abstract Ideas :   Clearly and fluently at all times   Expression Indep Measure Interim :   Complete independence   Lois Huxley - 07/09/2016 15:26 EDT   Social Interaction Score   Interacts Appropriately Without  Supervision :   Yes   Interacts Appropriately :   At all times   Social Interaction Indep Measure Interim :   Complete independence   Darius Bump, ROBYN - 07/09/2016 15:26 EDT   Problem Solving Score   Solves Complex Problems :   Yes   Ability to Solve Complex Problems :   Makes decisions with mild difficulty   Problem Solving Indep Measure Interim :   Modified independence   Lois Huxley - 07/09/2016 15:26 EDT   Memory Score   Recognizes, Remembers Routines, and Executes Requests Without Prompting :   Yes   Remembers and Executes Requests :   Mild difficulty   Memory Indep Measure Interim :   Modified independence   Darius Bump, ROBYN - 07/09/2016 15:26 EDT

## 2016-07-09 NOTE — Nursing Note (Signed)
Medication Administration Follow Up-Text       Medication Administration Follow Up Entered On:  07/09/2016 4:27 EDT    Performed On:  07/09/2016 3:00 EDT by Katheren Puller, RN, Matthew      Intervention Information:     oxycodone  Performed by Wyvonna Plum, RN, DEBORAH L on 07/09/2016 02:00:00 EDT       oxycodone,20mg   Oral,moderate pain (4-7)       Medication Effectiveness Evaluation   Medication Administration Reason :   Pain   Medication Effective :   Yes   Medication Response :   Symptoms improved   Salvatore Decent - 07/09/2016 4:27 EDT

## 2016-07-09 NOTE — Progress Notes (Signed)
Functional Indep Measure Scores - Text       Functional Independence Measure Scores Entered On:  07/09/2016 12:19 EDT    Performed On:  07/09/2016 12:19 EDT by Milon DikesKemmerlin, RN, Tonye BecketBrittany Miller               Eating Score   Patient's Independence Level With Eating Tasks :   Independent/Modified independence   Independent or Modifications Needed :   Complete independence   Functional Independence Measure Eating :   Complete independence   Kemmerlin, RN, Tonye BecketBrittany Miller - 07/09/2016 12:19 EDT   Toileting Score   Patient's Independence Level with Toileting Tasks :   Independent/Modified independence   Independent or Modifications Needed :   Assistive device(s), manages independently   Toileting :   Modified independence   Kemmerlin, RN, Tonye BecketBrittany Miller - 07/09/2016 12:19 EDT   Bladder Management Score   Patient's Independence Level with Bladder Management Tasks :   Independent/Modified independence   Independent or Modifications Needed :   Urinal/Bedpan/Commode/Elevated seat, manages independently   Functional Independence Measure Bladder Management :   Modified independence   Kemmerlin, RN, Tonye BecketBrittany Miller - 07/09/2016 12:19 EDT   Bowel Management Score   Patient's Independence Level with Bowel Management Tasks :   Independent/Modified independence   InM Bowel Mgmt Indep or Modifications :   Requires medication at least once a week   Functional Independence Measure Bowel Management :   Modified independence   Kemmerlin, RN, Tonye BecketBrittany Miller - 07/09/2016 12:19 EDT   Transfer Bed/Chair/WC Score   Patient's independence Level with Bed, Chair, Wheelchair Tasks :   Independent/Modified independence   Independent or Modifications Needed :   Wheelchair needed for transfer   Bed, Chair, Wheelchair Transfer :   Modified independence   Wolf CreekKemmerlin, RN, Tonye BecketBrittany Miller - 07/09/2016 12:19 EDT

## 2016-07-09 NOTE — Progress Notes (Signed)
 PT Inpatient Daily Documentation - Text       PT Inpatient Daily Documentation Entered On:  07/09/2016 15:44 EDT    Performed On:  07/09/2016 10:00 EDT by BERNARDINO HACKNEY, ROBYN               Reason for Treatment   Subjective Statement :   Pt in w/c, agreeable. I Walk delivered in early p.m. Pt requesting to train tomorrow 2* fatigue     *Reason for Referral :     Discussed dc to hotel for safey     R bimalleolar fx s/p ORIF and cast    PMH: gastric bypass, anemia    Precautions: NWB  on RLE, fall    schedule: 3 hr.     *Chief Complaint :   9/10 pain in R ankle in AM, 10/10 in PM (already asked but hadn't received meds)     RYAN, PTA, ROBYN - 07/09/2016 15:31 EDT   Review/Treatments Provided   PT Goals :   PT Short Term Goals    07/08/2016     PT Plan :   Treatment Frequency:  Mo/Tu/We/Th/Fr/Sa (modified)   Performed By: LEW ALMETA SAUER A  07/04/2016 17:06  Treatment Duration: 1 Performed By: LEW ALMETA SAUER A  07/04/2016 17:06  Planned Treatments: Balance training, Equipment training, Functional training, Gait training, Pain management, Patient education, Stair training, Therapeutic activities, Therapeutic exercises, Transfer training, Wheelchair assessment and management Performed By: LEW ALMETA SAUER A  07/04/2016 17:06     Short Term Goals Reviewed :   Yes   Physical Therapy Orders :   Physical Therapy Inpatient Additional Treatment Rehab - 07/04/16 17:55:54 EDT, Balance training, Equipment training, Functional training, Gait training, Pain management, Patient education, Stair training, Therapeutic activities, Therapeutic exercises, Transfer training, Wheelchair assessment and management...  PT FIMS - 07/03/16 15:22:00 EDT, Daily     Pain Present :   No actual or suspected pain   PT Therapeutic Activity,Mobility,Balance :   Yes   PT Therapeutic Exercise :   Yes   RYAN, PTA, ROBYN - 07/09/2016 15:31 EDT   Therapeutic Activities/Mobility/Balance   Functional Activity :   Therapeutic Activities  Activity  1:  Gait, Transfer training; Mod A; Crutches, Parallel bars; Other: pt anxiety c act; pt ed on tx sit <>stand c crutches in \\bars , pt performed x 2 bouts. pt ed on amb in \\bars  . pt amb ~ half length of \ bars forward turened and back to wc. pt c min assist for bal. pt stating not wanting to cont crutch trainig.       Performed Date:  07/08/2016  Activity 2:  Gait; Close S; Walker; Decreased endurance, Increased activation of targeted muscle group(s); pt amb 30 feet c rw, pt c cont ed on imprvoed tech to facilitae lifting c bue  and dec jumping motion. pt siting at ~ 30 feet.  Due to conversaton and distractons. pt ed on to perfomed act at max levels to alow for porgression of enduraecn with       Performed Date:  07/08/2016  Activity 3:  initated training c quad cane, for steps. pt worked on steping up  down 4 inc block x 5 bouts in \\bars . pt c min assist via cga, however fatigued.  pt discussed progressing to use of cane c amb c ue and \ bars in next. hoever initated discussion of dc       Performed Date:  07/08/2016  Activity 4:  plan to [facility].  pt encouraged to follow thouth with this plan for max safey       Performed Date:  07/08/2016     RYAN, PTA, ROBYN - 07/09/2016 15:31 EDT   PT Therapeutic Activities Grid     Activity 1  Activity 2        Activity :    Transitional movement   Static standing balance           Assist :    Modified independence   Modified independence           Position :    Supported stand   Supported stand           Equipment :    Parallel bars   Parallel bars           Response :    Tolerated well   Tolerated well           Comment :    repeated sit-stand in parallel bars mod I.    pt standing in parallel bars for table top activity to simulate standing and working at new job. Pt standing x 20 min total with rests prn (3). good maintaining of NWB right LE with standing activity             RYAN, PTA, ROBYN - 07/09/2016 15:31 EDT  BERNARDINO HACKNEY, ROBYN - 07/09/2016 15:31 EDT        Reassess  Mobility :   Yes   RYAN, PTA, ROBYN - 07/09/2016 15:31 EDT   PT Mobility   Mobility Grid   Roll Left :   Rehab Modified independence   Roll Right :   Rehab Modified independence   Roll Supine :   Rehab Modified independence   Supine to Sit :   Rehab Modified independence   Sit to Supine :   Rehab Modified independence   Scooting :   Rehab Modified independence   Sit to Stand :   Rehab Modified independence   Stand to Sit :   Rehab Modified independence   Transfer Bed to and From Chair :   Rehab Modified independence   Transfer Toilet :   Supervision or setup   RYAN, PTA, ROBYN - 07/09/2016 15:31 EDT   Amb Ability Varied Surf/Distraction Grid   Uneven Surfaces :   Does not occur   Distracting Environments :   Does not occur   Curbs :   Does not occur   Stairs :   Does not occur   Ramp :   Does not occur   RYAN, PTA, ROBYN - 07/09/2016 15:31 EDT   Transfer Type :   Squat pivot   Ambulation Device Utilized :   Rolling walker   Other Mobility Training :   rolling stool supporting RLE in // bars   PT Mobility Reviewed :   Chaney BERNARDINO HACKNEY, ROBYN - 07/09/2016 15:31 EDT   Therapeutic Exercise   Therapeutic Exercise RTF :   Therapeutic Exercise  Exercise 1:  pt stating isseus c steps. pt ed  on isseus and concerns c attemtps to bump up 3 flighs. pt agrees on need to work on alt solutions.       Performed Date:  07/08/2016  Exercise 2:  Supported stand; Walker; 20; verbal and tactile cues for proper technique and to slow down.; pt performing hip flexion right, knee flexion right, hip abd/add right , hip extension with knee flexion and extension right  Performed Date:  07/07/2016     BERNARDINO JOSETTA MEDICI - 07/09/2016 15:31 EDT   Therapeutic Exercise Grid     Exercise 1          Exercise :    Lower extremity strengthening              Position :    Supported stand              Equipment/Device :    Parallel bars              Repetition/Time :    20              Resist or Assist :    verbal and tactile sues for proper technique               Comment :    pt performing hip  and knee flexion right, abd/add right, hip extension with knee flexion and with knee extension right, knee flexion right                RYAN, PTA, ROBYN - 07/09/2016 15:31 EDT         Short Term Goals   Bed Mobility Goal Grid     Goal #1          Descriptors :    Roll to right and left, Sit to supine, Supine to sit              Level :    Modified independence              Status :    Goal met              Date Met :    07/04/2016 EDT                BERNARDINO JOSETTA, ROBYN - 07/09/2016 15:31 EDT         Transfers Goal Grid     Goal #1  Goal #2        Descriptors :    Squat pivot transfer, Other: NWB on RLE   Sit to stand, Stand to sit           Level :    Close supervision   Minimal assistance           Device :       Axillary crutches           Status :    Goal met   Goal met           Date Met :    07/04/2016 EDT   07/04/2016 EDT             BERNARDINO JOSETTA, ROBYN - 07/09/2016 15:31 EDT  BERNARDINO JOSETTA, ROBYN - 07/09/2016 15:31 EDT        W/C Management Grid     Goal #1          Descriptors :    Manual wheelchair propulsion indoors              Level :    Modified independence              Distance :    150 ft              Status :    Goal met              Date Met :    07/04/2016 EDT  RYAN, PTA, ROBYN - 07/09/2016 15:31 EDT         PT ST Goals Reviewed :   Chaney MOTTO, PTA, ROBYN - 07/09/2016 15:31 EDT   Assessment   PT Impairments or Limitations :   Ambulation deficits, Endurance deficits, Equipment training, Home accessibility/housing, Pain limiting function, Transfer deficits   Barriers to Safe Discharge PT :   Limited family support, Limited social support, Severity of deficits   Discharge Recommendations :   Pt has new job awaiting her in [place] in 10 days, will be living in 3rd floor apt!  She needs a rolling knee walker for use at work and axillary crutches for use at home (and maybe stairs?)     PT Treatment Recommendations :   Pt tolerating increased standing to simulate  new job requirement. Pt states she desires w/c at d/c. Agree that pt needs w/s at d/c 2* NWB right limiting pt standing tolerance/time. Pt at risk for overuse injury left LE if not resting during the day. Pt requires lightweight w/c for management 2* anemia limitng pt's ability to push heavy w/c. Pt NWB right LE makes foot propulsion right contraindicated. Pt in w/c after treatments.     RYAN, PTA, ROBYN - 07/09/2016 15:31 EDT   Time Spent With Patient   PT Time In :   10:00 EST   PT Time Out :   11:00 EST   PT Time In 2 :   14:00 EST   PT Time Out 2 :   14:30 EST   PT Group Therapy Units :   30 Minutes   PT Group Therapy Time :   30 minutes   PT Therapeutic Exercise Units :   2 units   PT Therapeutic Exercise Time :   30 minutes   PT ADL TRAINING 15 MN :   2 units   PT ADL Training Time :   30 minutes   PT Total Individual Therapy Time :   60 minutes   PT Total Group Therapy Time :   30 minutes   PT Treatment Time Comment :   Pt in group of 4 pts, 1 PTA for LE strengthening ex. in seated position. Pt performing glute sets, isometric abd/add,LAQ, hip flexion, dorsi and plantar flexion L. Pt educated in importance of continued ex in seated position at home. Pts socializing well   PT Total Timed Code Treatment Units :   4 units   PT Total Timed Code Tx Minutes :   60 minutes   PT Total Untimed Code Treatment Minutes :   30 minutes   PT Total Treatment Time Rehab :   90 minutes   RYAN, PTA, ROBYN - 07/09/2016 15:31 EDT

## 2016-07-09 NOTE — Progress Notes (Signed)
OT Time Spent With Patient - Text       OT Time Spent With Patient Entered On:  07/09/2016 15:19 EDT    Performed On:  07/09/2016 15:19 EDT by Pricilla Holm, DANIELLE               Time Spent With Patient   OT Time In :   13:30 EST   OT Time Out :   14:00 EST   OT Group Therapy Units :   30 Minutes   OT Group Therapy Time :   30 minutes   OT Therapeutic Exercise Units :   0 units   OT Therapeutic Exercise Time :   0 minutes   OT Total Individual Therapy Time :   0 minutes   OT Total Group Therapy Time :   30 minutes   OT Treatment Time Comment :   Pt participated in 30 min group focused on static standing balance and functional reach. Pt tolerated treatment well, and took rest breaks as needed.    OT Total Timed Code Treatment Units :   0 units   OT Total Timed Code Treatment Minutes :   0 minutes   OT Total Untimed Code Treatment Minutes :   30 minutes   OT Total Treatment Time Rehab :   30 minutes   Marisa Cyphers - 07/09/2016 15:19 EDT

## 2016-07-09 NOTE — Nursing Note (Signed)
Medication Administration Follow Up-Text       Medication Administration Follow Up Entered On:  07/09/2016 12:19 EDT    Performed On:  07/09/2016 8:43 EDT by Milon DikesKemmerlin, RN, Tonye BecketBrittany Miller      Intervention Information:     oxycodone  Performed by Milon DikesKemmerlin, RN, Tonye BecketBrittany Miller on 07/09/2016 07:43:00 EDT       oxycodone,20mg   Oral,moderate pain (4-7)       Medication Effectiveness Evaluation   Medication Administration Reason :   Pain   Medication Effective :   Yes   Medication Response :   Symptoms improved   Kemmerlin, RN, Tonye BecketBrittany Miller - 07/09/2016 12:19 EDT

## 2016-07-09 NOTE — Case Communication (Signed)
DME Face to Face Documentation - Text       DME Face to Face Documentation Entered On:  07/09/2016 13:04 EDT    Performed On:  07/09/2016 12:58 EDT by Genice RougeWARMOTH-MD,   E               DME Face to Face Encounter   Encounter Date :   07/09/2016 12:58 EDT   DME Certification Statement :   I had a face to face encounter with this patient that meets the provider face to face encounter requirements., The encounter was related in whole or in part for the following medical conditions, which is the primary reason for Durable Medical Equipment, Based upon my findings during evaluation of this patient, this equipment as specified on attached is medically necessary for the patient in the home., I have spoken to the patient about the need for this equipment and the required use at home within the past 30 days.   DME Walker :   The patient has a mobility limitation that significantly impairs beneficiary's ability to participate in one or more mobility related activities of daily living in the home AND, The patient is able to safely use the walker AND, The functional mobility deficit can be sufficiently resolved by the use of the walker   DME Other :   axillary crutches  need for mobility for non weight bearing of right lower extremity due to bimalleolar fractures fpr 6 weeks.  cannot propell a standard wheelchair in her home and she can self propell a light wheel chair and needs a cnair for mobility.    Carolyne FiscalWARMOTH-MD,   E - 07/09/2016 12:58 EDT

## 2016-07-09 NOTE — Progress Notes (Signed)
Chaplaincy Note - Text       Chaplaincy Note Entered On:  07/09/2016 16:16 EDT    Performed On:  07/09/2016 16:13 EDT by Julaine Fusi               Chaplaincy Consult   Faith/Denomination :   Catholic   Importance of Faith :   Moderately important   Worshipping Community :   Not disclosed.   Religious Spiritual Resources :   Hope, Other: Positive attitude   Religious Spiritual Practices :   Music   Religious Spiritual Concerns :   Anxiety   Religious Family Issues :   Pt feels 3 daughters view her as a "problem" due to her financial situation and recent injury.   Additional Information, Chaplaincy :   Pt anxious and "scared" about impending but necessary move and how her injury may impact that.   Julaine Fusi - 07/09/2016 16:13 EDT

## 2016-07-09 NOTE — Nursing Note (Signed)
Medication Administration Follow Up-Text       Medication Administration Follow Up Entered On:  07/09/2016 12:19 EDT    Performed On:  07/08/2016 13:51 EDT by Milon DikesKemmerlin, RN, Tonye BecketBrittany Miller      Intervention Information:     ondansetron  Performed by Milon DikesKemmerlin, RN, Tonye BecketBrittany Miller on 07/08/2016 12:51:00 EDT       ondansetron,4mg   Oral,nausea/vomiting       Medication Effectiveness Evaluation   Medication Administration Reason :   Nausea   Medication Effective :   Yes   Medication Response :   Symptoms improved   Kemmerlin, RN, Tonye BecketBrittany Miller - 07/09/2016 12:19 EDT

## 2016-07-09 NOTE — Nursing Note (Signed)
Medication Administration Follow Up-Text       Medication Administration Follow Up Entered On:  07/09/2016 12:19 EDT    Performed On:  07/08/2016 14:52 EDT by Milon Dikes, RN, Tonye Becket      Intervention Information:     oxycodone  Performed by Milon Dikes, RN, Tonye Becket on 07/08/2016 13:52:00 EDT       oxycodone,20mg   Oral,moderate pain (4-7)       Medication Effectiveness Evaluation   Medication Administration Reason :   Pain   Medication Effective :   Yes   Medication Response :   Symptoms improved   Kemmerlin, RN, Tonye Becket - 07/09/2016 12:19 EDT

## 2016-07-10 NOTE — Progress Notes (Signed)
 PT Inpatient Daily Documentation - Text       PT Inpatient Daily Documentation Entered On:  07/10/2016 16:50 EDT    Performed On:  07/10/2016 16:35 EDT by ARLANA, PT, LINDA R               Reason for Treatment   Subjective Statement :    I am afraid  Pt is very worried about moving, starting a new job.      *Reason for Referral :     Discussed dc to hotel for safey     R bimalleolar fx s/p ORIF and cast    PMH: gastric bypass, anemia    Precautions: NWB  on RLE, fall    schedule: 3 hr.     *Chief Complaint :   9/10 pain in R ankle in AM, 10/10 in PM (already asked but hadn't received meds)     SCHWARTZ, PT, LINDA R - 07/10/2016 16:35 EDT   Review/Treatments Provided   PT Goals :   PT Short Term Goals    07/09/2016     PT Plan :   Treatment Frequency:  Mo/Tu/We/Th/Fr/Sa (modified)   Performed By: LEW ALMETA SAUER A  07/04/2016 17:06  Treatment Duration: 1 Performed By: LEW ALMETA SAUER A  07/04/2016 17:06  Planned Treatments: Balance training, Equipment training, Functional training, Gait training, Pain management, Patient education, Stair training, Therapeutic activities, Therapeutic exercises, Transfer training, Wheelchair assessment and management Performed By: LEW ALMETA SAUER A  07/04/2016 17:06     Short Term Goals Reviewed :   Yes   Physical Therapy Orders :   Physical Therapy Inpatient Additional Treatment Rehab - 07/04/16 17:55:54 EDT, Balance training, Equipment training, Functional training, Gait training, Pain management, Patient education, Stair training, Therapeutic activities, Therapeutic exercises, Transfer training, Wheelchair assessment and management...  PT FIMS - 07/03/16 15:22:00 EDT, Daily     Pain Present :   Yes actual or suspected pain   PT Therapeutic Activity,Mobility,Balance :   Yes   SCHWARTZ, PT, LINDA R - 07/10/2016 16:35 EDT   Pain Assessment   Pain Location :   Ankle   Laterality :   Right   Quality :   Aching   Time Pattern :   Intermittent   Onset :   Gradual   Pain  Negatively Impacts :   Daily life   Self Report Pain :   Numeric rating scale   Numeric Pain Scale :   6   Numeric Pain Score :   6    SCHWARTZ, PT, LINDA R - 07/10/2016 16:35 EDT   Therapeutic Activities/Mobility/Balance   Functional Activity :   Therapeutic Activities  Activity 1:  Transitional movement; Mod I; Supported stand; Parallel bars; Tolerated well; repeated sit-stand in parallel bars mod I.       Performed Date:  07/09/2016  Activity 2:  Static standing balance; Mod I; Supported stand; Parallel bars; Tolerated well; pt standing in parallel bars for table top activity to simulate standing and working at new job. Pt standing x 20 min total with rests prn (3). good maintaining of NWB right LE with standing activity       Performed Date:  07/09/2016  Activity 3:  initated training c quad cane, for steps. pt worked on steping up  down 4 inc block x 5 bouts in \\bars . pt c min assist via cga, however fatigued.  pt discussed progressing to use of cane c amb c ue and \ bars in next.  hoever initated discussion of dc       Performed Date:  07/08/2016  Activity 4:  plan to [facility]. pt encouraged to follow thouth with this plan for max safey       Performed Date:  07/08/2016     ARLANA ALMETA ROCK JONELLE - 07/10/2016 16:35 EDT   PT Therapeutic Activities Grid     Activity 1  Activity 2  Activity 3      Activity :    Functional mobility              Assist :    Modified independence   Close supervision   Minimal verbal cues, Visual cues        Position :    Other: R knee on kinee pad of knee walker   Sitting on floor   Supported short sit        Equipment :    Other: Knee walker   Other: Stairs   Wheelchair        Response :    Tolerated well   Tolerated well   Tolerated well        Comment :    Pt instructed in use of knee walker. Taught how and when to lock and unlock. Practiced on unit and became mod I by end of session   Practiced ascending and descending flight of stairs inb the seated position. Able to perfrom with rest  breaks. Able to use         2 HR to come to stand at top and bottom of stairs   Pt perfromed group ther ex to both LE as appropriate. 20 reps each glut sets, marching, knee ext, hip abd/add,     p;antar flex, dorsi flex to improve strength and endurance. Reviewwed individual precautions. Instructed in proper  pulmonary hygiene          SCHWARTZ, PT, LINDA R - 07/10/2016 16:35 EDT  SCHWARTZ, PT, LINDA R - 07/10/2016 16:35 EDT  SCHWARTZ, PT, LINDA R - 07/10/2016 16:35 EDT       Reassess Mobility :   Yes   SCHWARTZ, PT, LINDA R - 07/10/2016 16:35 EDT   PT Mobility   Mobility Grid   Roll Left :   Rehab Modified independence   Roll Right :   Rehab Modified independence   Roll Supine :   Rehab Modified independence   Supine to Sit :   Rehab Modified independence   Sit to Supine :   Rehab Modified independence   Scooting :   Rehab Modified independence   Sit to Stand :   Rehab Modified independence   Stand to Sit :   Rehab Modified independence   Transfer Bed to and From Chair :   Rehab Modified independence   Transfer Toilet :   Supervision or setup   [place], PT, LINDA R - 07/10/2016 16:35 EDT   Functional Mobility Details :   Pt c/o blurred vision in PM, unable to assess gait w crutches (nurse notified)    9/1 pt   usi9ng knee walker for mobility very successfully.   SCHWARTZ, PT, LINDA R - 07/10/2016 16:35 EDT   Amb Ability Varied Surf/Distraction Grid   Level Surfaces :   Rehab Modified independence   Uneven Surfaces :   Does not occur   Distracting Environments :   Does not occur   Curbs :   Does not occur   Stairs :   Close supervision  Ramp :   Does not occur   SCHWARTZ, PT, LINDA R - 07/10/2016 16:35 EDT   Transfer Type :   Squat pivot   Ambulation Device Utilized :   Rolling walker   Stair Rail :   Bilateral   Number of Stairs :   12    Distance Level Surface :   500 ft   Other Mobility Training :   rolling stool supporting RLE in // bars   PT Mobility Reviewed :   Yes   SCHWARTZ, PT, LINDA R - 07/10/2016 16:35 EDT   Short  Term Goals   Bed Mobility Goal Grid     Goal #1          Descriptors :    Roll to right and left, Sit to supine, Supine to sit              Level :    Modified independence              Status :    Goal met              Date Met :    07/04/2016 EDT                SCHWARTZ, PT, LINDA R - 07/10/2016 16:35 EDT         Transfers Goal Grid     Goal #1  Goal #2        Descriptors :    Squat pivot transfer, Other: NWB on RLE   Sit to stand, Stand to sit           Level :    Close supervision   Minimal assistance           Device :       Axillary crutches           Status :    Goal met   Goal met           Date Met :    07/04/2016 EDT   07/04/2016 EDT             SCHWARTZ, PT, LINDA R - 07/10/2016 16:35 EDT  SCHWARTZ, PT, LINDA R - 07/10/2016 16:35 EDT        W/C Management Grid     Goal #1          Descriptors :    Manual wheelchair propulsion indoors              Level :    Modified independence              Distance :    150 ft              Status :    Goal met              Date Met :    07/04/2016 EDT                DRYTJMUS, PT, LINDA R - 07/10/2016 16:35 EDT         PT ST Goals Reviewed :   Chaney CASEY, PT, LINDA R - 07/10/2016 16:35 EDT   Assessment   PT Impairments or Limitations :   Endurance deficits, Equipment training, Home accessibility/housing, Pain limiting function, Transfer deficits   Barriers to Safe Discharge PT :   Limited family support, Limited social support, Severity of deficits   Discharge Recommendations :   Pt has  new job awaiting her in [place], [place] in 10 days, will be living in 3rd floor apt!  She needs a rolling knee walker for use at work and axillary crutches for use at home (and maybe stairs?)     PT Treatment Recommendations :   Continuing to progress toward goals for discharge next week. Continue POC     SCHWARTZ, PT, LINDA R - 07/10/2016 16:35 EDT   Time Spent With Patient   PT Time In :   8:30 EST   PT Time Out :   9:30 EST   PT Time In 2 :   14:00 EST   PT Time Out 2 :   14:30 EST   SCHWARTZ, PT,  LINDA R - 07/10/2016 17:24 EDT   PT Group Therapy Units :   30 Minutes   SCHWARTZ, PT, LINDA R - 07/13/2016 11:41 EDT     PT Group Therapy Time :   30 minutes   PT Gait Training Units :   2 units   PT Gait Training Time :   30 minutes   PT Wheelchair Management Units :   2 units   PT Wheelchair Management Time :   30 minutes   PT Total Individual Therapy Time :   60 minutes   PT Total Group Therapy Time :   30 minutes   PT Total Timed Code Treatment Units :   4 units   PT Total Timed Code Tx Minutes :   60 minutes   PT Total Untimed Code Treatment Minutes :   30 minutes   PT Total Treatment Time Rehab :   90 minutes   SCHWARTZ, PT, LINDA R - 07/10/2016 16:35 EDT

## 2016-07-10 NOTE — Nursing Note (Signed)
Medication Administration Follow Up-Text       Medication Administration Follow Up Entered On:  07/10/2016 17:59 EDT    Performed On:  07/10/2016 3:03 EDT by Milon DikesKemmerlin, RN, Tonye BecketBrittany Miller      Intervention Information:     oxycodone  Performed by Salvatore Decentausch, RN, Matthew on 07/10/2016 02:03:00 EDT       oxycodone,20mg   Oral,moderate pain (4-7)       Medication Effectiveness Evaluation   Medication Administration Reason :   Pain   Medication Effective :   Yes   Medication Response :   Symptoms improved   Kemmerlin, RN, Tonye BecketBrittany Miller - 07/10/2016 17:59 EDT

## 2016-07-10 NOTE — Nursing Note (Signed)
Medication Administration Follow Up-Text       Medication Administration Follow Up Entered On:  07/10/2016 17:59 EDT    Performed On:  07/10/2016 8:58 EDT by Milon Dikes, RN, Tonye Becket      Intervention Information:     oxycodone  Performed by Milon Dikes, RN, Tonye Becket on 07/10/2016 07:58:00 EDT       oxycodone,20mg   Oral,moderate pain (4-7)       Medication Effectiveness Evaluation   Medication Administration Reason :   Pain   Medication Effective :   Yes   Medication Response :   Symptoms improved   Kemmerlin, RN, Tonye Becket - 07/10/2016 17:59 EDT

## 2016-07-10 NOTE — Progress Notes (Signed)
Functional Indep Measure Scores - Text       Functional Independence Measure Scores Entered On:  07/10/2016 17:59 EDT    Performed On:  07/10/2016 17:58 EDT by Milon Dikes, RN, Tonye Becket               Eating Score   Patient's Independence Level With Eating Tasks :   Independent/Modified independence   Independent or Modifications Needed :   Complete independence   Functional Independence Measure Eating :   Complete independence   Kemmerlin, RN, Tonye Becket - 07/10/2016 17:58 EDT   Toileting Score   Patient's Independence Level with Toileting Tasks :   Independent/Modified independence   Independent or Modifications Needed :   Assistive device(s), manages independently   Toileting :   Modified independence   Kemmerlin, RN, Tonye Becket - 07/10/2016 17:58 EDT   Bladder Management Score   Patient's Independence Level with Bladder Management Tasks :   Independent/Modified independence   Independent or Modifications Needed :   Urinal/Bedpan/Commode/Elevated seat, manages independently   Functional Independence Measure Bladder Management :   Modified independence   Kemmerlin, RN, Tonye Becket - 07/10/2016 17:58 EDT   Bowel Management Score   Patient's Independence Level with Bowel Management Tasks :   Independent/Modified independence   InM Bowel Mgmt Indep or Modifications :   Complete independence   Functional Independence Measure Bowel Management :   Complete independence   Kemmerlin, RN, Tonye Becket - 07/10/2016 17:58 EDT   Transfer Bed/Chair/WC Score   Patient's independence Level with Bed, Chair, Wheelchair Tasks :   Independent/Modified independence   Independent or Modifications Needed :   Assistive device(s), manages independently   Bed, Chair, Wheelchair Transfer :   Modified independence   [place], RN, Tonye Becket - 07/10/2016 17:58 EDT

## 2016-07-10 NOTE — Progress Notes (Signed)
OT Time Spent With Patient - Text       OT Time Spent With Patient Entered On:  07/10/2016 15:29 EDT    Performed On:  07/10/2016 15:28 EDT by Gaynell Face, COTA, RACHEL C               Time Spent With Patient   OT Time In :   13:30 EST   OT Time Out :   14:00 EST   OT Group Therapy Units :   30 Minutes   OT Group Therapy Time :   30 minutes   OT Total Group Therapy Time :   30 minutes   OT Treatment Time Comment :   Pt participating in 30 minute BLE strengththening and dynamic standing balance group to incr balance for incr'd I during ADLs and fxl mobility c knee rollator.   OT Total Untimed Code Treatment Minutes :   30 minutes   OT Total Treatment Time Rehab :   30 minutes   MARSHALL, COTA, RACHEL C - 07/10/2016 15:28 EDT

## 2016-07-10 NOTE — Nursing Note (Signed)
Medication Administration Follow Up-Text       Medication Administration Follow Up Entered On:  07/10/2016 18:00 EDT    Performed On:  07/10/2016 15:42 EDT by Milon Dikes, RN, Tonye Becket      Intervention Information:     oxycodone  Performed by Milon Dikes, RN, Tonye Becket on 07/10/2016 14:42:00 EDT       oxycodone,20mg   Oral,moderate pain (4-7)       Medication Effectiveness Evaluation   Medication Administration Reason :   Pain   Medication Effective :   Yes   Medication Response :   Symptoms improved   Kemmerlin, RN, Tonye Becket - 07/10/2016 17:59 EDT

## 2016-07-10 NOTE — Progress Notes (Signed)
Functional Indep Measure Scores - Text       Functional Independence Measure Scores Entered On:  07/10/2016 16:53 EDT    Performed On:  07/10/2016 16:50 EDT by Hermelinda MedicusSCHWARTZ, PT, LINDA R               Walk/Wheelchair Score   Mode of Locomotion Goal :   Wheelchair   Type of Wheelchair Goal :   Manual wheelchair   Mode of Locomotion on Discharge :   Wheelchair   Patient Ambulates :   Does not occur   Functional Independence Measure Walk :   Does not occur   SCHWARTZ, PT, LINDA R - 07/10/2016 16:50 EDT   Stairs Score   Stairs Assessed :   Yes   Number of Stairs Performed :   12    Amount of Assistance Necessary :   Standby supervision   Assistive Device Utilized :   Bilateral rails   Functional Independence Measure Stairs :   Supervision or setup   Stairs Score Comments :   Pt ascends and descends in seated position using HR to come to standing at top and bottom of stairs   [place], PT, LINDA R - 07/10/2016 16:50 EDT   Expression Score   Expression Mode :   Vocal   Expresses Complex or Abstract Information Without Prompting or Cueing :   Yes   Expresses Complex or Abstract Ideas :   Clearly and fluently at all times   Expression Indep Measure Interim :   Complete independence   Hermelinda MedicusSCHWARTZ, PT, LINDA R - 07/10/2016 16:50 EDT   Social Interaction Score   Interacts Appropriately Without Supervision :   Yes   Interacts Appropriately :   At all times   Social Interaction Indep Measure Interim :   Complete independence   SCHWARTZ, PT, LINDA R - 07/10/2016 16:50 EDT   Problem Solving Score   Solves Complex Problems :   Yes   Ability to Solve Complex Problems :   Consistently solves problems independently   Problem Solving Indep Measure Interim :   Complete independence   SCHWARTZ, PT, LINDA R - 07/10/2016 16:50 EDT   Memory Score   Recognizes, Remembers Routines, and Executes Requests Without Prompting :   Yes   Remembers and Executes Requests :   Mild difficulty   Memory Indep Measure Interim :   Modified independence   SCHWARTZ, PT, LINDA R -  07/10/2016 16:50 EDT

## 2016-07-10 NOTE — Progress Notes (Signed)
OT Inpatient Daily Documentation - Text       OT Inpatient Daily Documentation Entered On:  07/10/2016 12:40 EDT    Performed On:  07/10/2016 12:39 EDT by Rosezena SensorRANK, COTA, ERLENE L               Reason for Treatment   *Reason for Referral :   ADMITTING DIAGNOSES:  1.  Right bimalleolar ankle fracture on August 22nd.  2.  Status post open reduction internal fixation on August 23rd.  3.  History of gastric bypass surgery for obesity.  4.  Anemia    Patient had fall at home and did not get help for 12 hrs. Had surgery and is non wt bearing right ankle. [place] mod I to start new job.          3 hr rule  -   NON WT BEARING RIGHT LE  -  FALL RISK     *Chief Complaint :   Decreased mobility secondary to non wt bearing on right LE.  Impacts transfers, ADLs including dressing, bathing, toileting.        Rosezena SensorCRANK, COTA, ERLENE L - 07/10/2016 12:39 EDT   Review/Treatments Provided   OT Therapeutic Activity,Mobility,Balance :   Yes   Rosezena SensorCRANK, COTA, ERLENE L - 07/10/2016 13:30 EDT   OT Goals :   OT Short Term Goals    07/05/2016  Bathing Goal #1: Tub transfer; Mod I; Tub bench; Initial; Pt needs to work on this skill for new apartment.     OT Plan :   Treatment Frequency: Daily Performed By: Rosezena SensorRANK, COTA, Greig RightERLENE L   07/09/2016  Treatment Duration: 1 Performed By: Rosezena SensorRANK, COTA, Rupert StacksERLENE L   07/09/2016  Planned Treatments: Balance training, Basic Activities of Daily Living, Equipment training, Group therapy, HEP, Mobility training, Patient education, Therapeutic activities, Therapeutic exercises, Therapeutic exercises for strengthening and ROM Performed By: Rosezena SensorRANK, COTA, ERLENE L   07/09/2016     OT Update Plan :   Yes   Occupational Therapy Orders :   Occupational Therapy Inpatient Additional Treatment Rehab - 07/09/16 14:51:00 EDT, Balance training, Basic Activities of Daily Living, Equipment training, Group therapy, HEP, Mobility training, Patient education, Therapeutic activities, Therapeutic  exercises, Therapeutic exercises for strengthening and ROM, fo...  OT FIMS - 07/03/16 15:22:00 EDT, Daily     Pain Present :   No actual or suspected pain   OT Therapeutic Exercise :   Yes   Rosezena SensorCRANK, COTA, ERLENE L - 07/10/2016 12:39 EDT   Therapeutic Activities   OT Therapeutic Activities RTF :   Therapeutic Activities  Activity 1:  Tolerance to upright position, Transitional movement; tolerated standing for aprox 5 mins duration req'g short rest break; practiced transfers to max fx'l safety and endurance       Performed Date:  07/09/2016     Larwance RoteCRANK, COTA, ERLENE L - 07/10/2016 13:30 EDT   OT Therapeutic Activities Grid     Activity 1          Activities :    Community mobility, Transitional movement, Work related activities              Comments :    Administrator, 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       Performed Date:  07/07/2016     Larwance RoteCRANK, COTA, ERLENE L - 07/10/2016 12:39 EDT   Rosezena SensorRANK, COTA, Rupert StacksRLENE L - 07/10/2016 12:39 EDT   OT Therapeutic Exercise Grid     Exercise 1          Exercise :    Upper extremity strengthening              Position :    Unsupported short sit              Equipment/Device :    Free weights, Rickshaw              Comments :    UB ex program completed to max fx'l strength and endurance toenhance adl's and mobility tasks lessening effects of fatigue during transfers and standing tasks                Rosezena SensorCRANK, COTA, ERLENE L - 07/10/2016 12:46 EDT         Plan   Frequency :   Daily    Duration :   1    Duration Unit :   Weeks   Estimated Hours Per Day :   2-3 hrs per day   Planned Treatments :   Balance training, Basic Activities of Daily Living, Equipment training, Group therapy, HEP, Mobility training, Patient education, Therapeutic activities, Therapeutic exercises, Therapeutic exercises for strengthening and ROM   Treatment Plan/Goals Established With Patient/Caregiver :   Yes   Discharge Recommendations :   Home with DME equipment.  Patient would probably benenfit from Knee walker.      Rosezena SensorCRANK, COTA, ERLENE L - 07/10/2016 12:39 EDT   Time Spent With Patient   OT Time In :   12:30 EST   OT Time Out :   13:30 EST   OT Therapeutic Exercise Units :   4 units   OT Therapeutic Exercise Time :   60 minutes   OT Total Individual Therapy Time :   60 minutes   OT Total Timed Code Treatment Units :   4 units   OT Total Timed Code Treatment Minutes :   60 minutes   OT Total Treatment Time Rehab :   60 minutes   CRANK, COTA, ERLENE L - 07/10/2016 12:39 EDT

## 2016-07-10 NOTE — Case Communication (Signed)
 CM Discharge Planning Assessment - Text       CM Discharge Planning Ongoing Assessment Entered On:  07/10/2016 16:49 EDT    Performed On:  07/10/2016 16:48 EDT by GEROME HANDING S               Discharge Needs I   Previously Documented Discharge Needs :   DISCHARGE PLAN/NEEDS:  Anticipated Discharge Date: 07/13/2016 - GEROME HANDING RAMAN - 07/10/16 15:33:00  Discharge To, Anticipated: Home independently - GEROME HANDING RAMAN - 07/10/16 15:33:00  Needs Assistance with Transportation: Yes [place] GEROME HANDING S - 07/10/16 15:33:00  EQUIPMENT/TREATMENT NEEDS:  Needs Assistance at Home Upon Discharge: No [place] GEROME HANDING S - 07/10/16 15:33:00       Previously Documented Benefits Information :   Performed By: [name]  - 07/03/16 10:39:00       Anticipated Discharge Date :   07/13/2016 EDT   Anticipated Discharge Time Slot :   1000-1200   Discharge To :   Home independently   CM Progress Note :   Pt is a 61 year old female.  Pt recently accepted a new job in [place]. that she needs to be mobile (able to carry over 30lbs) and her new apartment is on the 3rd floor.  Pt is upset over her current financial concerns and being homeless by this weekend.  TC will be held on 07/06/2016.    07/06/2016  SW met with Pt to present ITC-Summary.  Pt verbalized being upset about being homeless as of this weekend and how she will not be able to afford DME.  Pt just accepted a new job in [place]. & she is expected to start work on 07/14/2016.  Pt was upset when presented with anticipated D/C date, as evidenced by her tearsfulness.  SW spoke with her about exploring asking family for assistance, but she does not want to bother or ask for money.  SW made other suggestions that Pt discounted.  SW will continue to assist.  Insurance updated on 07/10/2016.  vcd     GEROME HANDING S - 07/10/2016 16:48 EDT   Discharge Needs II   Home Equipment Rehab :   Wheelchair - Manual   Needs Assistance with Transportation :   Yes   Needs Assistance at Home Upon  Discharge :   No   Discharge Planning Time Spent :   15 minutes   GEROME HANDING RAMAN - 07/10/2016 16:48 EDT   Discharge Planning   Discharge Arrangements :       Patient Post-Acute Information    Patient Name: Anita Vazquez, Anita Vazquez  MRN: [id number]  FIN: [id number]  Gender: Female  DOB: 02/15/2056  Age:  34 Years        *** No Post-Acute Placement(s) Listed ***          *** No Post-Acute Service(s) Listed Nurse, mental health Hospital Services :   PATIENT MOVING TO [place] TO START A NEW JOB    PATIENT WILL OBTAIN A WHEELCHAIR FROM VENDOR IN [place]     Discharge Options Discussed with Patient :   DME   Discharge Plan Discussion :   Discussed with patient, Patient agrees with plan   GEROME HANDING RAMAN - 07/10/2016 16:48 EDT

## 2016-07-10 NOTE — Nursing Note (Signed)
Medication Administration Follow Up-Text       Medication Administration Follow Up Entered On:  07/10/2016 2:01 EDT    Performed On:  07/09/2016 21:04 EDT by Katheren Puller, RN, Matthew      Intervention Information:     oxycodone  Performed by Salvatore Decent on 07/09/2016 20:04:00 EDT       oxycodone,20mg   Oral,moderate pain (4-7)       Medication Effectiveness Evaluation   Medication Administration Reason :   Pain   Medication Effective :   Yes   Medication Response :   Symptoms improved   Salvatore Decent - 07/10/2016 2:01 EDT

## 2016-07-10 NOTE — Case Communication (Signed)
 CM Discharge Planning Assessment - Text       CM Discharge Planning Ongoing Assessment Entered On:  07/10/2016 15:34 EDT    Performed On:  07/10/2016 15:33 EDT by GEROME HANDING S               Discharge Needs I   Previously Documented Discharge Needs :   DISCHARGE PLAN/NEEDS:  Anticipated Discharge Date: 07/13/2016 - GREG SYLIVA BROCKS - 07/06/16 16:35:00  EQUIPMENT/TREATMENT NEEDS:       Previously Documented Benefits Information :   Performed By: [name]  - 07/03/16 10:39:00       Anticipated Discharge Date :   07/13/2016 EDT   Anticipated Discharge Time Slot :   1000-1200   Discharge To :   Home independently   CM Progress Note :   Pt is a 60 year old female.  Pt recently accepted a new job in [place]. that she needs to be mobile (able to carry over 30lbs) and her new apartment is on the 3rd floor.  Pt is upset over her current financial concerns and being homeless by this weekend.  TC will be held on 07/06/2016.    07/06/2016  SW met with Pt to present ITC-Summary.  Pt verbalized being upset about being homeless as of this weekend and how she will not be able to afford DME.  Pt just accepted a new job in [place]. & she is expected to start work on 07/14/2016.  Pt was upset when presented with anticipated D/C date, as evidenced by her tearsfulness.  SW spoke with her about exploring asking family for assistance, but she does not want to bother or ask for money.  SW made other suggestions that Pt discounted.  SW will continue to assist.  Insurance updated on 07/10/2016.  vcd     GEROME HANDING S - 07/10/2016 15:33 EDT   Discharge Needs II   Home Equipment Rehab :   Wheelchair - Manual   Needs Assistance with Transportation :   Yes   Needs Assistance at Home Upon Discharge :   No   Discharge Planning Time Spent :   45 minutes   GEROME HANDING RAMAN - 07/10/2016 15:33 EDT   Discharge Planning   Discharge Arrangements :       Patient Post-Acute Information    Patient Name: Anita Vazquez, Anita Vazquez  MRN: [id number]  FIN:  [id number]  Gender: Female  DOB: 11-14-2055  Age:  61 Years        *** No Post-Acute Placement(s) Listed ***          *** No Post-Acute Service(s) Listed Nurse, mental health Hospital Services :   PATIENT MOVING TO [place] TO START A NEW JOB     Discharge Options Discussed with Patient :   DME   Discharge Plan Discussion :   Discussed with patient, Patient agrees with plan   GEROME HANDING RAMAN - 07/10/2016 15:33 EDT

## 2016-07-11 NOTE — Progress Notes (Signed)
OT Time Spent With Patient - Text       OT Time Spent With Patient Entered On:  07/11/2016 17:00 EDT    Performed On:  07/11/2016 16:59 EDT by Rosalia Hammers, OT, Orland Mustard               Time Spent With Patient   OT Time In :   13:30 EST   OT Time Out :   14:00 EST   OT Group Therapy Units :   30 Minutes   OT Group Therapy Time :   30 minutes   OT Therapeutic Exercise Units :   0 units   OT Therapeutic Exercise Time :   0 minutes   OT Total Individual Therapy Time :   0 minutes   OT Total Group Therapy Time :   30 minutes   OT Treatment Time Comment :   Pt participated in 30 minute interactive education group with focus on fall prevention.  Pt able to identify fall risk factors and fall prevention strategies.  Pt participated in fall risk quiz in which pt identified as high fall risk.   OT Total Timed Code Treatment Units :   0 units   OT Total Timed Code Treatment Minutes :   0 minutes   OT Total Untimed Code Treatment Minutes :   30 minutes   OT Total Treatment Time Rehab :   30 minutes   [name] - 07/11/2016 16:59 EDT

## 2016-07-11 NOTE — Progress Notes (Signed)
Functional Indep Measure Scores - Text       Functional Independence Measure Scores Entered On:  07/11/2016 0:05 EDT    Performed On:  07/11/2016 0:04 EDT by Katheren Pullerausch, RN, Matthew               Eating Score   Patient's Independence Level With Eating Tasks :   Setup/Supervision   Supervision or Setup Needed :   Gather equipment   Functional Independence Measure Eating :   Supervision or setup   Salvatore DecentRausch, RN, Matthew - 07/11/2016 0:04 EDT   Toileting Score   Patient's Independence Level with Toileting Tasks :   Setup/Supervision   Supervision or Setup Needed :   Gather equipment   Toileting :   Supervision or setup   Salvatore DecentRausch, RN, Matthew - 07/11/2016 0:04 EDT   Bladder Management Score   Patient's Independence Level with Bladder Management Tasks :   Setup/Supervision   InM Bladder Mgmt Supervision,Setup Needs :   Gather equipment   Functional Independence Measure Bladder Management :   Supervision or setup   Salvatore DecentRausch, RN, Matthew - 07/11/2016 0:04 EDT

## 2016-07-11 NOTE — Progress Notes (Signed)
Functional Indep Measure Scores - Text       Functional Independence Measure Scores Entered On:  07/11/2016 13:45 EDT    Performed On:  07/11/2016 13:45 EDT by Candie Echevaria, RN, JESSICA M               Eating Score   Patient's Independence Level With Eating Tasks :   Independent/Modified independence   Independent or Modifications Needed :   Complete independence   Functional Independence Measure Eating :   Complete independence   O'QUINN, RN, JESSICA M - 07/11/2016 13:45 EDT   Bladder Management Score   Patient's Independence Level with Bladder Management Tasks :   Independent/Modified independence   Independent or Modifications Needed :   Complete independence   Functional Independence Measure Bladder Management :   Complete independence   O'QUINN, RN, JESSICA M - 07/11/2016 13:45 EDT   Bowel Management Score   Patient's Independence Level with Bowel Management Tasks :   Independent/Modified independence   InM Bowel Mgmt Indep or Modifications :   Complete independence   Functional Independence Measure Bowel Management :   Complete independence   O'QUINN, RN, JESSICA M - 07/11/2016 13:45 EDT   Transfer Bed/Chair/WC Score   Patient's independence Level with Bed, Chair, Wheelchair Tasks :   Independent/Modified independence   Independent or Modifications Needed :   Assistive device(s), manages independently   Bed, Chair, Wheelchair Transfer :   Modified independence   [place], RN, JESSICA M - 07/11/2016 13:45 EDT

## 2016-07-11 NOTE — Nursing Note (Signed)
Medication Administration Follow Up-Text       Medication Administration Follow Up Entered On:  07/11/2016 2:19 EDT    Performed On:  07/10/2016 21:24 EDT by Katheren Pullerausch, RN, Matthew      Intervention Information:     oxycodone  Performed by Salvatore Decentausch, RN, Matthew on 07/10/2016 20:24:00 EDT       oxycodone,20mg   Oral,moderate pain (4-7)       Medication Effectiveness Evaluation   Medication Administration Reason :   Pain   Medication Effective :   Yes   Medication Response :   Symptoms improved   Salvatore DecentRausch, RN, Matthew - 07/11/2016 2:19 EDT

## 2016-07-11 NOTE — Progress Notes (Signed)
PT Inpatient Daily Documentation - Text       PT Inpatient Daily Documentation Entered On:  07/11/2016 15:34 EDT    Performed On:  07/11/2016 15:28 EDT by Albertha Ghee, PTA, [place]               Reason for Treatment   Subjective Statement :   Patient in w/c with neds within reach and alarms set after session     *Reason for Referral :     Discussed dc to hotel for safey     R bimalleolar fx s/p ORIF and cast    PMH: gastric bypass, anemia    Precautions: NWB  on RLE, fall    schedule: 3 hr.     Lyndel Safe, MARY L - 07/11/2016 15:28 EDT   Review/Treatments Provided   PT Goals :   PT Short Term Goals    07/10/2016     PT Plan :   Treatment Frequency:  Mo/Tu/We/Th/Fr/Sa (modified)   Performed By: Marty Heck A  07/04/2016 17:06  Treatment Duration: 1 Performed By: Marty Heck A  07/04/2016 17:06  Planned Treatments: Balance training, Equipment training, Functional training, Gait training, Pain management, Patient education, Stair training, Therapeutic activities, Therapeutic exercises, Transfer training, Wheelchair assessment and management Performed By: Marty Heck A  07/04/2016 17:06     Short Term Goals Reviewed :   Yes   Physical Therapy Orders :   Physical Therapy Inpatient Additional Treatment Rehab - 07/04/16 17:55:54 EDT, Balance training, Equipment training, Functional training, Gait training, Pain management, Patient education, Stair training, Therapeutic activities, Therapeutic exercises, Transfer training, Wheelchair assessment and management...  PT FIMS - 07/03/16 15:22:00 EDT, Daily     Pain Present :   No actual or suspected pain   KEMBLE, PTA, MARY L - 07/11/2016 15:28 EDT   Group   PT Group Attended :   Therapeutic exercise group   Therapeutic Focus of PT Groups :   Endurance, Lower extremity strengthening   Therapeutic Equipment for Groups :   Theraball, Theraband   Theraband Color PT :   Other: orange   Response :   Tolerated well   KEMBLE, PTA, MARY L - 07/11/2016 15:28 EDT   Short  Term Goals   Bed Mobility Goal Grid     Goal #1          Descriptors :    Roll to right and left, Sit to supine, Supine to sit              Level :    Modified independence              Status :    Goal met              Date Met :    07/04/2016 EDT                KEMBLE, PTA, MARY L - 07/11/2016 15:28 EDT         Transfers Goal Grid     Goal #1  Goal #2        Descriptors :    Squat pivot transfer, Other: NWB on RLE   Sit to stand, Stand to sit           Level :    Close supervision   Minimal assistance           Device :       Axillary crutches  Status :    Goal met   Goal met           Date Met :    07/04/2016 EDT   07/04/2016 EDT             Albertha Ghee, PTA, MARY L - 07/11/2016 15:28 EDT  Albertha Ghee, PTA, MARY L - 07/11/2016 15:28 EDT        W/C Management Grid     Goal #1          Descriptors :    Manual wheelchair propulsion indoors              Level :    Modified independence              Distance :    150 ft              Status :    Goal met              Date Met :    07/04/2016 EDT                Albertha Ghee, PTA, MARY L - 07/11/2016 15:28 EDT         PT ST Goals Reviewed :   Yes   KEMBLE, PTA, MARY L - 07/11/2016 15:28 EDT   Assessment   PT Impairments or Limitations :   Endurance deficits, Equipment training, Home accessibility/housing, Pain limiting function, Transfer deficits   Barriers to Safe Discharge PT :   Limited family support, Limited social support, Severity of deficits   Discharge Recommendations :   Pt has new job awaiting her in [place], [place] in 10 days, will be living in 3rd floor apt!  She needs a rolling knee walker for use at work and axillary crutches for use at home (and maybe stairs?)     PT Treatment Recommendations :   cont skilled PT for return to PLOF     KEMBLE, PTA, MARY L - 07/11/2016 15:28 EDT   Time Spent With Patient   PT Time In :   14:00 EST   PT Time Out :   14:30 EST   PT Group Therapy Units :   30 Minutes   PT Group Therapy Time :   30 minutes   PT Gait Training Units :   0 units   PT  Gait Training Time :   0 minutes   PT Concurrent Gait Training Time :   0 minutes   PT Co-Treatment Gait Training Time :   0 minutes   PT Total Individual Therapy Time :   0 minutes   PT Total Concurrent Therapy Time :   0 minutes   PT Total Co-Treatment Therapy Time :   0 minutes   PT Total Group Therapy Time :   30 minutes   PT Total Timed Code Treatment Units :   0 units   PT Total Timed Code Tx Minutes :   0 minutes   PT Total Untimed Code Treatment Minutes :   30 minutes   PT Total Treatment Time Rehab :   30 minutes   KEMBLE, PTA, MARY L - 07/11/2016 15:28 EDT   Additional Information   Additional Information PT :   Patient in therapeutic exercise group for L LE and modified R LE (secondary to R LE casted) consisting of seated DF, PF, hip flexion, LAQ hip ADD/ABD, gluteal sets and w/c press ups focusing on control and timing and ending  with sustained contraction.     Lyndel Safe, MARY L - 07/11/2016 15:28 EDT

## 2016-07-11 NOTE — Nursing Note (Signed)
Medication Administration Follow Up-Text       Medication Administration Follow Up Entered On:  07/11/2016 17:03 EDT    Performed On:  07/11/2016 9:15 EDT by Candie Echevaria, RN, JESSICA M      Intervention Information:     oxycodone  Performed by Candie Echevaria, RN, JESSICA M on 07/11/2016 08:15:00 EDT       oxycodone,20mg   Oral,moderate pain (4-7)       Medication Effectiveness Evaluation   Medication Administration Reason :   Pain   Medication Effective :   Yes   Medication Response :   Symptoms improved, Continue to observe for symptoms   Candie Echevaria, RN, JESSICA M - 07/11/2016 17:03 EDT

## 2016-07-11 NOTE — Nursing Note (Signed)
Medication Administration Follow Up-Text       Medication Administration Follow Up Entered On:  07/11/2016 17:03 EDT    Performed On:  07/11/2016 15:37 EDT by Candie Echevaria, RN, JESSICA M      Intervention Information:     oxycodone  Performed by Gwyneth Sprout, RN, DEBBIE D on 07/11/2016 14:37:00 EDT       oxycodone,20mg   Oral,moderate pain (4-7)       Medication Effectiveness Evaluation   Medication Administration Reason :   Pain   Medication Effective :   Yes   Medication Response :   Symptoms improved, Continue to observe for symptoms   Candie Echevaria, RN, JESSICA M - 07/11/2016 17:03 EDT

## 2016-07-12 NOTE — Nursing Note (Signed)
Medication Administration Follow Up-Text       Medication Administration Follow Up Entered On:  07/12/2016 23:58 EDT    Performed On:  07/12/2016 21:58 EDT by Azucena KubaEID, RN, VANESSA      Intervention Information:     oxycodone  Performed by Azucena KubaEID, RN, Erie NoeVANESSA on 07/12/2016 20:58:00 EDT       oxycodone,20mg   Oral,moderate pain (4-7)       Medication Effectiveness Evaluation   Medication Administration Reason :   Pain   Medication Effective :   Yes   Medication Response :   Symptoms improved   Azucena KubaREID, RN, Erie NoeVANESSA - 07/12/2016 23:58 EDT

## 2016-07-12 NOTE — Progress Notes (Signed)
Functional Indep Measure Scores - Text       Functional Independence Measure Scores Entered On:  07/12/2016 22:56 EDT    Performed On:  07/12/2016 22:55 EDT by Azucena KubaEID, RN, VANESSA               Eating Score   Patient's Independence Level With Eating Tasks :   Independent/Modified independence   Independent or Modifications Needed :   Complete independence   Functional Independence Measure Eating :   Complete independence   REID, RN, VANESSA - 07/12/2016 22:55 EDT   Toileting Score   Patient's Independence Level with Toileting Tasks :   Independent/Modified independence   Independent or Modifications Needed :   Assistive device(s), manages independently   Toileting :   Modified independence   Azucena KubaREID, RN, E[place] NoeVANESSA - 07/12/2016 22:55 EDT   Bladder Management Score   Patient's Independence Level with Bladder Management Tasks :   Independent/Modified independence   Independent or Modifications Needed :   Complete independence   Functional Independence Measure Bladder Management :   Complete independence   REID, RN, VANESSA - 07/12/2016 22:55 EDT   Bowel Management Score   Patient's Independence Level with Bowel Management Tasks :   Independent/Modified independence   InM Bowel Mgmt Indep or Modifications :   Requires medication at least once a week   Functional Independence Measure Bowel Management :   Modified independence   Azucena KubaREID, RN, VANESSA - 07/12/2016 22:55 EDT   Transfer Bed/Chair/WC Score   Patient's independence Level with Bed, Chair, Wheelchair Tasks :   Independent/Modified independence   Independent or Modifications Needed :   Assistive device(s), manages independently, Environmental consultantWalker, Wheelchair needed for transfer   CHS IncBed, ParamedicChair, Wheelchair Transfer :   Modified independence   SylvaniteREID, Health and safety inspectorN, VANESSA - 07/12/2016 22:55 EDT   Transfer Toilet Score   Patient's Independence Level with Transfer Toilet Tasks :   Independent/Modified independence   Independent or Modifications Needed :   Assistive device(s), manages independently   Transfer Toilet  :   Modified independence   Azucena KubaREID, RN, [place] NoeVANESSA - 07/12/2016 22:55 EDT

## 2016-07-12 NOTE — Discharge Summary (Signed)
Discharge  Summary                 [facility]   E. Joslyn Hy, MD  Admission Date: 07/03/2016  Discharged Date: 07/13/2016    ADMITTING PHYSICIAN:  Dr. Ardelle Park .    REFERRING PHYSICIAN:  Dr. Lovena LeMichael Wildstein.    PRIMARY CARE PHYSICIAN:  None.     DISCHARGE DIAGNOSES:  1.  Right bimalleolar ankle fracture on August 22nd.  2.  Status post open reduction internal fixation on August 23rd.  3.  History of gastric bypass surgery for obesity.  4.  Anemia.  5.  Anxiety.    DISCHARGE MEDICATIONS:  As per discharge medication reconciliation  form completed on day of discharge.    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 on 07/01/2016 with a history of  having fallen [place], sustaining a right ankle  fracture.  She remained on the floor overnight,  and called EMS  the  next morning who [facility].  She was  admitted to Dr. Duffy BruceWildstein's service for an open reduction and internal  fixation under general anesthesia.  She was to keep her leg elevated  for 48-72 hours after surgery and was to remain nonweightbearing for 6  weeks.  She had been referred to physical and occupational therapies  and required contact guard to come from supine to sitting and minimal  assistance to come from sitting to standing and moderate assistance to  transfer from bed to chair.  She required supervision for feeding,  grooming and upper extremity dressing, and required minimal assistance  for lower extremity dressing and bathing.  She was able to walk only 4  feet with moderate assistance.  She had not been on any DVT  prophylaxis.  Her last bowel movement had been on August 23rd.  She  was voiding adequately, eating well, and sleeping well.  Her pain was  6/10 but was well controlled with Percocet.  As she lives alone and  has a minimal amount of local social support and has a flight of  stairs to negotiate to her cottage on  [place], it was  recommended by her inpatient team that she be transferred for  intensive rehabilitation to maximize her independence and her mobility  as well as her self-care activities.      SOCIAL HISTORY:  She does  not use tobacco nor alcohol.  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  She  hopes to be able to stay in a hotel locally in order to get to and  from her work.  She is divorced and has 3 supportive children, a daughter who  lives in [place],  another daughter lives in [place]  and another daughter, who is a Development worker, communityphysician in [place].  Her parents were medical missionaries and she has lived all  over the [place], grew up in [place].     PAST MEDICAL HISTORY:  Significant for gastric bypass surgery, for morbid  obesity of 410 pounds for which she has learned to live with the side  effects, but occasionally has anemia.  MEDICATIONS:   She was on no medications at home.    ALLERGIES:  PENICILLIN.    PAST SURGICAL HISTORY:  Significant for cesarean sections,  cholecystectomy and gastric bypass surgery that was done in  [place], [place].    FAMILY HISTORY:  Her father died at age 60 with pulmonary fibrosis  which was idiopathic and mother died at age 60 with endometrial  carcinoma.    PHYSICAL EXAMINATION:  Her weight is 75.5 kg and height is 160 cm.   Her temperature is 36.8 with a pulse of 68, respirations 18, blood  pressure 125/72, O2 sat 98% on room air.  She is alert, cooperative,  very pleasant, fully oriented , intelligent lady who is anxious but in no acute  distress.  Her head is normocephalic.  Her neck is supple.  There are  no bruits, no JVD.  Lungs are clear to auscultation.  Her heart  shows  regular rhythm.  Her abdomen is obese.  There are positive bowel  sounds.  The right lower extremity has a splint in place below the knee.   She moves her toes well.  There is no swelling or bruising noted.   She is able to raise her right leg off the bed.  Left lower extremity  has a well-healed scar from a previous compound fracture of the lower  leg, but active movement in all muscle groups and bilateral upper  extremities also have active movement throughout all muscle groups.    HOSPITAL COURSE:  She was treated with an interdisciplinary team  approach consisting of physical and occupational therapy, social  services, rehab nursing and physician.  She was well motivated to  maximize her independence and was quite determined to be able to get  to her new job by September 5th.  She ran into problems acquiring  needed equipment due to her funding source which would only pay for  crutches but through the generosity of friends and family she was able  to obtain a knee walker for which she was able to utilize quite well.   She was modified independent ambulating with the knee walker 500 feet  and could negotiate stairs with close supervision.  She was modified  independent in bed mobility including coming from supine to sit and  sit to supine.  Sit to stand was modified independent and she was able  to transfer from bed to wheelchair with modified independence.  Car  transfers could also be done with modified independence.  She did have  a significant amount of pain that required Percocet.  She is being  given a 5-day supply to carry with her.  She was also quite anxious  about her anticipated move and being able to start a new job as well  as all the stressors that are related to a move.  She was able to do  her self-care activities with complete to modified independence.  Her  surgical incision was enclosed in a cast, was not inspected, but she  was able to move her toes well.  Her daughter from [place]  plans to  transport her to [facility] [place] tomorrow and hopefully get her settled so  that she can begin her new job on Tuesday.  She will need to find an  orthopedist for followup in [place] as well as a primary care  physician.  It was a pleasure having her on the rehab unit.      Rene PaciJames E , MD  TR: *  n DD: 07/12/2016 16:29 TD: 07/12/2016 18:45 Job#: [id number]  \\[id number]#: [id number]  \\[id number]    Signature Line     Electronically Signed on 07/13/2016 05:58 AM EDT   ________________________________________________   Genice Rouge               Modified by: Genice Rouge on 07/13/2016 05:58 AM EDT      Modified by: Genice Rouge on 07/13/2016 05:58 AM EDT

## 2016-07-12 NOTE — Progress Notes (Signed)
Functional Indep Measure Scores - Text       Functional Independence Measure Scores Entered On:  07/12/2016 16:02 EDT    Performed On:  07/12/2016 16:02 EDT by Candie Echevaria, RN, JESSICA M               Eating Score   Patient's Independence Level With Eating Tasks :   Independent/Modified independence   Independent or Modifications Needed :   Complete independence   Functional Independence Measure Eating :   Complete independence   O'QUINN, RN, JESSICA M - 07/12/2016 16:02 EDT   Bladder Management Score   Patient's Independence Level with Bladder Management Tasks :   Independent/Modified independence   Independent or Modifications Needed :   Complete independence   Functional Independence Measure Bladder Management :   Complete independence   O'QUINN, RN, JESSICA M - 07/12/2016 16:02 EDT   Bowel Management Score   Patient's Independence Level with Bowel Management Tasks :   Independent/Modified independence   InM Bowel Mgmt Indep or Modifications :   Complete independence   Functional Independence Measure Bowel Management :   Complete independence   O'QUINN, RN, JESSICA M - 07/12/2016 16:02 EDT   Transfer Bed/Chair/WC Score   Patient's independence Level with Bed, Chair, Wheelchair Tasks :   Independent/Modified independence   Independent or Modifications Needed :   Assistive device(s), manages independently   Bed, Chair, Wheelchair Transfer :   Modified independence   [place], RN, JESSICA M - 07/12/2016 16:02 EDT

## 2016-07-12 NOTE — Nursing Note (Signed)
Medication Administration Follow Up-Text       Medication Administration Follow Up Entered On:  07/12/2016 3:33 EDT    Performed On:  07/12/2016 3:47 EDT by Lovie Macadamia, RN, Angelina Sheriff      Intervention Information:     oxycodone  Performed by Lovie Macadamia, RN, Angelina Sheriff on 07/12/2016 02:47:00 EDT       oxycodone,20mg   Oral,moderate pain (4-7)       Medication Effectiveness Evaluation   Medication Administration Reason :   Pain   Medication Effective :   Yes   Medication Response :   Symptoms improved   [place], RN, Angelina Sheriff - 07/12/2016 3:33 EDT   Pain Assessment   Numeric Rating Pain Scale :   6   [place]uffle[place], RN, Angelina Sheriff - 07/12/2016 3:33 EDT   Image 4 -  Images currently included in the form version of this document have not been included in the text rendition version of the form.

## 2016-07-12 NOTE — Nursing Note (Signed)
Medication Administration Follow Up-Text       Medication Administration Follow Up Entered On:  07/12/2016 1:19 EDT    Performed On:  07/11/2016 21:54 EDT by Lovie Macadamia, RN, Angelina Sheriff      Intervention Information:     oxycodone  Performed by Lovie Macadamia, RN, Angelina Sheriff on 07/11/2016 20:54:00 EDT       oxycodone,20mg   Oral,moderate pain (4-7)       Medication Effectiveness Evaluation   Medication Administration Reason :   Pain   Medication Effective :   Yes   Medication Response :   Symptoms improved   [place], RN, Angelina Sheriff - 07/12/2016 1:19 EDT   Pain Assessment   Numeric Rating Pain Scale :   6   [place], RN, Angelina Sheriff - 07/12/2016 1:19 EDT   Image 4 -  Images currently included in the form version of this document have not been included in the text rendition version of the form.

## 2016-07-12 NOTE — Progress Notes (Signed)
Interdisciplinary Team Conference - Text       Interdisciplinary Team Conference PF Entered On:  07/12/2016 16:18 EDT    Performed On:  07/12/2016 16:15 EDT by [facility], PT, ADDIE G               Team Members   Primary Nurse :   Dillon Bjork RN, DEBRA A   Team Conference Date :   07/06/2016 EDT   [facility], PT, ADDIE G - 07/12/2016 16:15 EDT   OT Basic ADL   Basic ADL Grid   Eating :   Modified independence   Grooming :   Complete independence   Bathing :   Modified independence   UE Dressing :   Complete independence   LE Dressing :   Contact guard assistance   Toileting :   Modified independence   Transfer Toilet :   Modified independence   Shower Transfer :   Supervision or setup   Christena Deem - 07/12/2016 16:51 EDT   ADL Comments :   Pt refused ADLs on 9/3. Shower ADL completed 8/30. Mod I for eating 2* dentures. Independent with grooming task. Bathing in shower utilizing grab bars/shower seat. Independent for UE dressing task. Mod I for toileting/toilet transfer/and bed<>wc transfer utilizing grab bars or rw. Supervision for shower transfer 2* dec'd standing balance on wet surface.        OT ADL Reviewed :   Yes   Christena Deem - 07/12/2016 16:51 EDT   OT Short Term Goals.   Bathing Goal Grid     Goal #1          Activity :    Tub transfer              Assist :    Modified independence              Equipment :    Tub bench              Status :    Initial              Comment :    Pt needs to work on this skill for new apartment.                Christena Deem - 07/12/2016 16:51 EDT         OT STG Reviewed :   Quay Burow - 07/12/2016 16:51 EDT   OT Long Term Goals.   Patient/Caregiver Goals :   To be able to safely get around and take care of self in order to move to new location.   Christena Deem - 07/12/2016 16:51 EDT   Eating Goal   Grooming Goal :   Complete independence   Bathing Goal :   Modified independence   Upper Extremity Dressing :   Complete independence   Lower Body Dressing Goal :   Modified independence   Toileting Goal :    Modified independence   Toilet Transfer Goal :   Modified independence   Tub, Shower Transfer :   Modified independence   Christena Deem - 07/12/2016 16:51 EDT   OT Long Term Goals Reviewed :   Quay Burow - 07/12/2016 16:51 EDT   Occupational Therapy Summary.   Barriers to Safe Discharge OT :   Limited family support, Other: Non Wt bearing status ;Patient moving to [facility] for new job next week.   OT  Equipment Anticipated, Recommended :   Reacher, Other: Patient may benefit from knee walker since non wt bearing on right ankle   Additional Comments DME OT :   Patient would benefit from knee walker to get around at work. WIll also need additional device for home and getting up stairs to new apartment.   OT Team Notes Current :   Yes   Christena Deem - 07/12/2016 16:51 EDT   PT Mobility   Mobility Grid   Roll Left :   Rehab Modified independence   Roll Right :   Rehab Modified independence   Roll Supine :   Rehab Modified independence   Supine to Sit :   Rehab Modified independence   Sit to Supine :   Rehab Modified independence   Scooting :   Rehab Modified independence   Sit to Stand :   Rehab Modified independence   Stand to Sit :   Rehab Modified independence   Transfer Bed to and From Chair :   Rehab Modified independence   Transfer Toilet :   Supervision or setup   Car Transfer :   Rehab Modified independence   [facility], PT, ADDIE G - 07/12/2016 16:15 EDT   Functional Mobility Details :   Pt amb R LE NWB on rolling knee walker; pt able to bump up/down steps on her bottom, however cannot stand once at the stop of the stairs to functionally enter her home once up the stairs   [facility], PT, ADDIE G - 07/12/2016 16:15 EDT   Amb Ability Varied Surf/Distraction Grid   Level Surfaces :   Rehab Modified independence   Uneven Surfaces :   Does not occur   Distracting Environments :   Rehab Modified independence   Curbs :   Does not occur   Stairs :   Close supervision   Ramp :   Does not occur   [facility], PT, ADDIE G - 07/12/2016  16:15 EDT   Transfer Type :   Squat pivot   Ambulation Device Utilized :   Rolling walker   Number of Stairs :   12    Distance Level Surface :   500 ft   PT Mobility Reviewed :   Yes   LYNCH, PT, ADDIE G - 07/12/2016 16:15 EDT   PT WC Management   Type of Wheelchair :   Manual wheelchair   Wheelchair Details :   B UEs   [facility], PT, ADDIE G - 07/12/2016 16:15 EDT   Wheelchair Mobility Grid   Level Surfaces :   Rehab Modified independence   [place], PT, ADDIE G - 07/12/2016 16:15 EDT   Wheelchair Mobility Level Distance :   150 ft   Wheelchair Mobility Reviewed :   Yes   LYNCH, PT, ADDIE G - 07/12/2016 16:15 EDT   PT Short Term Goals.   Bed Mobility Goal Grid     Goal #1          Descriptors :    Roll to right and left, Sit to supine, Supine to sit              Level :    Modified independence              Status :    Goal met              Date Met :    07/04/2016 EDT  [facility], PT, ADDIE G - 07/12/2016 16:15 EDT         Transfers Goal Grid     Goal #1  Goal #2        Descriptors :    Squat pivot transfer, Other: NWB on RLE   Sit to stand, Stand to sit           Level :    Close supervision   Minimal assistance           Device :       Axillary crutches           Status :    Goal met   Goal met           Date Met :    07/04/2016 EDT   07/04/2016 EDT             [facility], PT, ADDIE G - 07/12/2016 16:15 EDT  [facility] PT, ADDIE G - 07/12/2016 16:15 EDT        W/C Management Grid     Goal #1          Descriptors :    Manual wheelchair propulsion indoors              Level :    Modified independence              Distance :    150 ft              Status :    Goal met              Date Met :    07/04/2016 EDT                [facility], PT, ADDIE G - 07/12/2016 16:15 EDT         PT STG Reviewed Charlsie Merles, PT, ADDIE G - 07/12/2016 16:15 EDT   PT Long Term Goals.   PT Patient,Caregiver Goal :   Be able to go to my new job in [place] in 10 days.   [facility], PT, ADDIE G - 07/12/2016 16:15 EDT   Mobility Goals Grid   Bed, Chair, Wheelchair Goal  :   Modified independence   (Comment: met [facility], PT, ADDIE G - 07/12/2016 16:15 EDT] )   Toilet Transfer Goal :   Modified independence   (Comment: met [[facility], PT, ADDIE G - 07/12/2016 16:15 EDT] )   Ambulation Level Surfaces Goal :   Modified independence   (Comment: met [facility], PT, ADDIE G - 07/12/2016 16:15 EDT] )   Wheelchair Mobility Level Surfaces Goal :   Modified independence   (Comment: met [LYNCH, PT, ADDIE G - 07/12/2016 16:15 EDT] )   Stairs Ambulation Assistance Level Goal :   Modified independence   (Comment: not met [facility], PT, ADDIE G - 07/12/2016 16:15 EDT] )   LYNCH, PT, ADDIE G - 07/12/2016 16:15 EDT   PT LTG Reconcilation :   Ambulation NWB on RLE w rolling knee walker   Type of Wheelchair Goal :   Manual wheelchair   Mode of Locomotion Goal :   Walk   LYN[facility], PT, ADDIE G - 07/12/2016 16:15 EDT   Physical Therapy Summary.   Barriers to Safe Discharge PT :   Limited family support, Limited social support, Severity of deficits   PT Equipment Anticipated or Recommended :   Crutches, Other: rolling knee walker   Additional Comments DME PT :  Pt purchased rolling knee walker to use at d/c, insurance denied payment of wc per pt; pt cannot stand up independently once at the top of stairs to enter home; MUST have assist at d/c, however family not currently willing to assist   PT Team Notes Current :   Yes   LYNCH, PT, ADDIE G - 07/12/2016 16:15 EDT   Severity Level.   Comprehension Indep Measure Interim :   Complete independence   Comprehension Mode :   Auditory   Expression Indep Measure Interim :   Complete independence   Expression Mode :   Vocal   Problem Solving Indep Measure Interim :   Complete independence   Memory Indep Measure Interim :   Complete independence   Social Interaction Indep Measure Interim :   Complete independence   Christena Deem - 07/12/2016 16:51 EDT

## 2016-07-12 NOTE — Progress Notes (Signed)
 PT Inpatient Discharge Exam -Text       PT Inpatient Discharge Exam Entered On:  07/12/2016 16:15 EDT    Performed On:  07/12/2016 11:00 EDT by [facility], PT, ADDIE G               Reason for Treatment   *Reason for Referral :     R bimalleolar fx s/p ORIF and cast    PMH: gastric bypass, anemia    Precautions: NWB  on RLE, fall    schedule: 3 hr.     [facility], PT, ADDIE G - 07/12/2016 15:56 EDT   General Information   Physical Therapy Orders :   Physical Therapy Inpatient Additional Treatment Rehab - 07/04/16 17:55:54 EDT, Balance training, Equipment training, Functional training, Gait training, Pain management, Patient education, Stair training, Therapeutic activities, Therapeutic exercises, Transfer training, Wheelchair assessment and management...  PT FIMS - 07/03/16 15:22:00 EDT, Daily     Precautions RTF :   Precaution Orders  Fall Risk Precautions - Ordered    -- 07/03/16 15:22:00 EDT, Stop date 07/03/16 15:22:00 EDT       Orientation Assessment :   Oriented x 4   Affect/Behavior :   Appropriate   Basic Command Following :   Intact   Safety/Judgment :   Intact   Pain Present :   No actual or suspected pain   Culture/Spiritual Beliefs to Incorporate :   No   LYNCH, PT, ADDIE G - 07/12/2016 15:56 EDT   Problem List   (As Of: 07/12/2016 16:15:38 EDT)   Problems(Active)    Alteration in comfort: pain (SNOMED CT  :65759985 )  Name of Problem:   Alteration in comfort: pain ; Recorder:   SYSTEM,  SYSTEM; Confirmation:   Confirmed ; Classification:   Interdisciplinary ; Code:   65759985 ; Last Updated:   07/02/2016 6:20 EDT ; Life Cycle Date:   07/02/2016 ; Life Cycle Status:   Active ; Vocabulary:   SNOMED CT   ; Comments:        07/02/2016 6:20 - SYSTEM,  SYSTEM  Problem added automatically by system based on initiation of Alteration in Comfort Plan of Care      Anemia (SNOMED CT  :ACSYtQDsdrRUkXtoqf79/w )  Name of Problem:   Anemia ; Recorder:   CANADY, RN, BROOKE N; Confirmation:   Confirmed ; Classification:   Medical ; Code:    ACSYtQDsdrRUkXtoqf79/w ; Contributor System:   PowerChart ; Last Updated:   05/19/2016 7:01 EDT ; Life Cycle Date:   05/19/2016 ; Life Cycle Status:   Active ; Vocabulary:   SNOMED CT        At risk for infection (SNOMED CT  :786229980 )  Name of Problem:   At risk for infection ; Recorder:   SYSTEM,  SYSTEM; Confirmation:   Confirmed ; Classification:   Interdisciplinary ; Code:   786229980 ; Last Updated:   07/03/2016 18:47 EDT ; Life Cycle Date:   07/03/2016 ; Life Cycle Status:   Active ; Vocabulary:   SNOMED CT   ; Comments:        07/03/2016 18:47 - SYSTEM,  SYSTEM  Problem added automatically by system based on initiation of Risk For Infection Plan of Care      Bowel dysfunction (SNOMED CT  :646865986 )  Name of Problem:   Bowel dysfunction ; Recorder:   SYSTEM,  SYSTEM; Confirmation:   Confirmed ; Classification:   Interdisciplinary ; Code:   646865986 ; Last Updated:  07/03/2016 18:47 EDT ; Life Cycle Date:   07/03/2016 ; Life Cycle Status:   Active ; Vocabulary:   SNOMED CT   ; Comments:        07/03/2016 18:47 - SYSTEM,  SYSTEM  Problem added automatically by system based on initiation of Bowel Dysfunction Plan of Care      Impaired tissue integrity (SNOMED CT  :18596984 )  Name of Problem:   Impaired tissue integrity ; Recorder:   SYSTEM,  SYSTEM; Confirmation:   Confirmed ; Classification:   Interdisciplinary ; Code:   18596984 ; Last Updated:   07/02/2016 6:20 EDT ; Life Cycle Date:   07/02/2016 ; Life Cycle Status:   Active ; Vocabulary:   SNOMED CT   ; Comments:        07/02/2016 6:20 - SYSTEM,  SYSTEM  Problem added automatically by system based on initiation of Impaired Tissue Integrity Plan of Care        Diagnoses(Active)    Anemia  Date:   07/03/2016 ; Diagnosis Type:   Discharge ; Confirmation:   Confirmed ; Clinical Dx:   Anemia ; Classification:   Medical ; Code:   ICD-10-CM ; Probability:   0 ; Diagnosis Code:   D64.9      Closed bimalleolar fracture  Date:   07/03/2016 ; Diagnosis Type:   Discharge ;  Confirmation:   Confirmed ; Clinical Dx:   Closed bimalleolar fracture ; Classification:   Medical ; Code:   ICD-10-CM ; Probability:   0 ; Diagnosis Code:   D17.156J      History of gastric bypass  Date:   07/03/2016 ; Diagnosis Type:   Discharge ; Confirmation:   Confirmed ; Clinical Dx:   History of gastric bypass ; Classification:   Medical ; Code:   ICD-10-CM ; Probability:   0 ; Diagnosis Code:   Z98.890      S/P ORIF (open reduction internal fixation) fracture  Date:   07/03/2016 ; Diagnosis Type:   Discharge ; Confirmation:   Confirmed ; Clinical Dx:   S/P ORIF (open reduction internal fixation) fracture ; Classification:   Medical ; Code:   ICD-10-CM ; Probability:   0 ; Diagnosis Code:   Z96.7        Home Environment   Living Environment :   Home Environment  Devices/Equipment at Home:  Wheelchair - Manual  Performed By:  [name] 07/10/2016  Anticipated Need for Home Modification:  No  Performed By:  LEW ALMETA SAUER A 07/04/2016  Detail Areas of Responsibilities:  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  Performed By:  LEW ALMETA SAUER A 07/04/2016  Kitchen:  1st floor  Performed By:  LEW ALMETA SAUER A 07/04/2016  Laundry:  1st floor  Performed By:  LEW ALMETA SAUER A 07/04/2016  Lives In:  Single level home  Performed By:  LEW ALMETA SAUER A 07/04/2016  Lives With:  Alone  Performed By:  LEW ALMETA SAUER A 07/04/2016  Living Situation:  Home independently  Performed By:  LEW ALMETA SAUER A 07/04/2016  Number of Stairs Outside:  15  Performed By:  LEW ALMETA SAUER A 07/04/2016  Outside Stairs Rail:  Rail on right going up  Performed By:  LEW ALMETA SAUER  A 07/04/2016  Patient's Responsibilities:  Driver, Employed, Health and wellness, 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, Laundry,  Leisure/Play/Hobbies, Manage Medications, Meal preparation, Personal ADL, Shopping  Performed By:  LEW ALMETA SAUER A 07/04/2016  Primary Bathroom:  1st floor  Performed By:  LEW ALMETA SAUER A 07/04/2016  Primary Bedroom:  1st floor  Performed By:  LEW ALMETA SAUER A 07/04/2016  *ADL:  Independent  Performed By:  LORELLA FORSTER, ANN K 07/04/2016  *Cognitive-Communication Skills:  Independent  Performed By:  LORELLA FORSTER, ANN K 07/04/2016  *Instrumental ADL:  Independent  Performed By:  LORELLA FORSTER, ANN K 07/04/2016  *Mobility:  Independent  Performed By:  LORELLA FORSTER, ANN K 07/04/2016     Living Situation :   Home independently   Lives With :   Alone   Lives In :   Single level home   Anticipated   Need For Home   Modification :   No   LYNCH, PT, ADDIE G - 07/12/2016 15:56 EDT   Stairs     Outside Stairs          Number of Stairs :    15               Rail :    Rail on right going up                [facility], PT, ADDIE G - 07/12/2016 15:56 EDT         Home Setup   Primary Bedroom :   1st floor   Primary Bathroom :   1st floor   Kitchen :   1st floor   Laundry :   1st floor   [facility], PT, ADDIE G - 07/12/2016 15:56 EDT   Patient's Responsibilities :   Driver, Employed, Health and wellness, 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, Laundry, Leisure/Play/Hobbies, Manage Medications, Meal preparation, Personal ADL, Shopping   Detail Areas of Responsibilities :   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[place], [place]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, will live in 3rd floor apt (willing to bump up stairs on buttocks if necessary)   [facility], PT, ADDIE G - 07/12/2016 15:56 EDT   Home Environment II   Living Environment :   Home Environment  *ADL:  Independent  Performed By:  LORELLA FORSTER, ANN K 07/04/2016  *Cognitive-Communication Skills:  Independent  Performed By:  LORELLA FORSTER, ANN K 07/04/2016  *Instrumental ADL:  Independent  Performed By:  LORELLA FORSTER, ANN K 07/04/2016  *Mobility:  Independent  Performed By:  LORELLA FORSTER, ANN K  07/04/2016  Kitchen:  1st floor  Performed By:  LORELLA FORSTER, ANN K 07/04/2016  Laundry:  1st floor  Performed By:  LORELLA FORSTER, ANN K 07/04/2016  Lives In:  Single level home  Performed By:  LORELLA FORSTER, ANN K 07/04/2016  Lives With:  Alone  Performed By:  LORELLA FORSTER, ANN K 07/04/2016  Living Situation:  Home independently  Performed By:  LORELLA FORSTER, ANN K 07/04/2016  Number of Stairs Outside:  15  Performed By:  LORELLA FORSTER, ANN K 07/04/2016  Patient's Responsibilities:  Driver, Employed, Health and wellness, 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, Laundry, Manage Medications, Meal preparation, Personal ADL, Retired, Proofreader By:  LORELLA, OT, ANN K 07/04/2016  Primary Bathroom:  1st floor  Performed By:  LORELLA FORSTER, ANN K 07/04/2016  Primary Bedroom:  1st floor  Performed By:  LORELLA FORSTER, ANN K 07/04/2016  Anticipated Need for Home Modification:  No  Performed By:  HAMILTON,  JAMETTA B 07/03/2016     Devices/Equipment :   Wheelchair - Manual   LYN[facility], PT, ADDIE G - 07/12/2016 15:56 EDT   Prior Functional Status   ADL :   Independent   Mobility :   Independent   Instrumental ADL :   Independent   Cognitive-Communication Skills :   Independent   LYNCH, PT, ADDIE G - 07/12/2016 15:56 EDT   Additional Information :   Patient is in process of moving to new place in [place] which is a second floor apartment that has 2 flights to get up to - one straight set and then one spiral staircase.   [facility], PT, ADDIE G - 07/12/2016 15:56 EDT   LE ROM/Strength   LE Overall Range of Motion Grid   Left Lower Extremity Active Range :   Within functional limits   Left Lower Extremity Passive Range :   Within functional limits   Right Lower Extremity Active Range :   ankle in cast @ 0 deg DF   Right Lower Extremity Passive Range :             LYNCH, PT, ADDIE G - 07/12/2016 15:56 EDT   Lt Lower Extremity Strength :   Within functional limits   Rt Lower Extremity Strength :   Other: WFL except ankle immobilized in cast (able to SLR in cast)   LYNCH, PT,  ADDIE G - 07/12/2016 15:56 EDT   LE/Trunk Tone   Left Lower Extremity Tone   Left Lower Extremity :   Normal   Right Lower Extremity :   Normal   Trunk :   Normal   LYNCH, PT, ADDIE G - 07/12/2016 15:56 EDT   UE ROM/Strength   Upper Extremity Overall ROM Grid   Left Upper Extremity Active Range :   Within functional limits   Left Upper Extremity Passive Range :   Within functional limits   Right Upper Extremity Active Range :   Within functional limits   Right Upper Extremity Passive Range :   Within functional limits   [facility], PT, ADDIE G - 07/12/2016 15:56 EDT   Lt Upper Extremity Strength :   Within functional limits   [facility], PT, ADDIE G - 07/12/2016 15:56 EDT   Left Upper Extremity Strength Grid   Shoulder Flexion :   4+   Shoulder Abduction :   4+   Elbow Flexion :   4+   Elbow Extension :   4   Finger Flexion :   4+   LYNCH, PT, ADDIE G - 07/12/2016 15:56 EDT   Rt Upper Extremity Strength :   Within functional limits   [facility], PT, ADDIE G - 07/12/2016 15:56 EDT   Right Upper Extremity Strength Grid   Shoulder Flexion :   4+   Shoulder Abduction :   4+   Elbow Flexion :   4+   Elbow Extension :   4   Finger Flexion :   4+   LYNCH, PT, ADDIE G - 07/12/2016 15:56 EDT   UE Tone         remainder (less than 1/2 of ROM)   Left Upper Extremity :   Normal   Right Upper Extremity :   Normal   LYNCH, PT, ADDIE G - 07/12/2016 15:56 EDT   UE Coordination   Mean Age Gender Lt 9 Hole Peg Test :   19.48   Right Mean for Age/Gender :  17.86   LYNCH, PT, ADDIE G - 07/12/2016 15:56 EDT   Left Upper Extremity Coordination Grid   Diadochokinesia :   Within functional limits   Finger to Nose :   Within functional limits   Finger Opposition :   Within functional limits   [facility], PT, ADDIE G - 07/12/2016 15:56 EDT   Right Upper Extremty Coordination Grid   Diadochokinesia :   Within functional limits   Finger to Nose :   Within functional limits   Finger Opposition :   Within functional limits   [facility], PT, ADDIE G - 07/12/2016 15:56 EDT   Sensation   Left  Upper Extremity Sensation   Light Touch :   Intact   Proprioception :   Intact   LYNCH, PT, ADDIE G - 07/12/2016 15:56 EDT   Right Upper Extremity Sensation   Light Touch :   Intact   Proprioception :   Intact   [facility], PT, ADDIE G - 07/12/2016 15:56 EDT   Left Lower Extremity Sensation   Light Touch :   Intact   Proprioception :   Intact   [facility], PT, ADDIE G - 07/12/2016 15:56 EDT   Right Lower Extremity Sensation   Light Touch :   Intact   Proprioception :   Intact   [facility], PT, ADDIE G - 07/12/2016 15:56 EDT   Sitting Balance   Static Sitting Balance Assessment Grid   Sits Without UE Support :   Rehab Modified independence   Sits With One UE Support :   Rehab Complete independence   Sits With Two UE Support :   Rehab Complete independence   LYNCH, PT, ADDIE G - 07/12/2016 15:56 EDT   Sitting Surface Evaluated Upon :   Bed   LYNCH, PT, ADDIE G - 07/12/2016 15:56 EDT   Dynamic Sitting Balance Assessment Grid   Posterior Shift :   Able   Lateral to the Left Shift :   Able   Lateral to the Right Shift :   Able   [facility], PT, ADDIE G - 07/12/2016 15:56 EDT   Functional Impact :   Reaching forward slightly difficult with non wt bearing status.   [facility], PT, ADDIE G - 07/12/2016 15:56 EDT   Standing Balance   Static Standing Balance   Stands Without UE Support :   Does not occur   Stands with One UE Support :   Rehab Modified independence   Stands with Two UE Support :   Rehab Modified independence   [facility], PT, ADDIE G - 07/12/2016 15:56 EDT   Standing Surface Evaluated Upon :   Floor   Patient Accepts Pertubations :   No   LYNCH, PT, ADDIE G - 07/12/2016 15:56 EDT   PT Mobility   Mobility Grid   Roll Left :   Rehab Modified independence   Roll Right :   Rehab Modified independence   Roll Supine :   Rehab Modified independence   Supine to Sit :   Rehab Modified independence   Sit to Supine :   Rehab Modified independence   Scooting :   Rehab Modified independence   Sit to Stand :   Rehab Modified independence   Stand to Sit :   Rehab Modified  independence   Transfer Bed to and From Chair :   Rehab Modified independence   Transfer Toilet :   Supervision or setup   Car Transfer :   Rehab Modified independence   [facility],  PT, ADDIE G - 07/12/2016 15:56 EDT   Functional Mobility Details :   Pt amb R LE NWB on rolling knee walker   LYNCH, PT, ADDIE G - 07/12/2016 15:56 EDT   Amb Ability Varied Surf/Distraction Grid   Level Surfaces :   Rehab Modified independence   Uneven Surfaces :   Does not occur   Distracting Environments :   Rehab Modified independence   Curbs :   Does not occur   Stairs :   Close supervision   (Comment: Pt able to bump up/down stairs with spv, however unable to stand at top of stairs to then enter her home independently  [facility], PT, ADDIE G - 07/12/2016 15:56 EDT] )   Ramp :   Does not occur   LYNCH, PT, ADDIE G - 07/12/2016 15:56 EDT   Transfer Type :   Squat pivot   Number of Stairs :   12    Distance Level Surface :   500 ft   PT Mobility Reviewed :   Yes   LYNCH, PT, ADDIE G - 07/12/2016 15:56 EDT   PT WC Management   Type of Wheelchair :   Manual wheelchair   Wheelchair Details :   B UEs   LYNCH, PT, ADDIE G - 07/12/2016 15:56 EDT   Wheelchair Mobility Grid   Level Surfaces :   Rehab Modified independence   [place], PT, ADDIE G - 07/12/2016 15:56 EDT   Wheelchair Mobility Level Distance :   150 ft   Wheelchair Mobility Reviewed :   Yes   LYNCH, PT, ADDIE G - 07/12/2016 15:56 EDT   DME   PT Equipment Anticipated or Recommended :   Crutches, Other: rolling knee walker   Additional Comments DME PT :   Pt purchased rolling knee walker to use at d/c, insurance denied payment of wc per pt   [facility], PT, ADDIE G - 07/12/2016 15:56 EDT   Assessment   PT Impairments or Limitations :   Endurance deficits, Equipment training, Home accessibility/housing, Pain limiting function   Barriers to Safe Discharge PT :   Limited family support, Limited social support, Severity of deficits   Discharge Recommendations :   Poor d/c plan; pt moving to [place], [place] to start new  job and living in 3rd floor apt, will have to bump up stairs on her bottom; currently has little/no family support; daugher unwilling to assist pt getting establised in new location; Pt adamant that she has to carry out this d/c plan or she will lose her new job, however unsure that she will be able to do so without some assist, at least initially to navigate difficulty living environment and driving (i.e. grocery shopping, getting medications from pharmacy, etc)     PT Treatment Recommendations :   Pt mod I with basic functional mobility such as transfers, amb with knee walker, and bumping up stairs on her bottom (although she cannot stand independently once at the top of stairs); however, given the complexxity of pt's new living situation, current recommendation is for pt to have assist initially, but family not willing to assist and pt adament that she must carry out this d/c plan or else she will lose her job.      [facility], PT, ADDIE G - 07/12/2016 15:56 EDT   Short Term Goals   Bed Mobility Goal Grid     Goal #1          Descriptors :    Roll  to right and left, Sit to supine, Supine to sit              Level :    Modified independence              Status :    Goal met              Date Met :    07/04/2016 EDT                [facility], PT, ADDIE G - 07/12/2016 15:56 EDT         Transfers Goal Grid     Goal #1  Goal #2        Descriptors :    Squat pivot transfer, Other: NWB on RLE   Sit to stand, Stand to sit           Level :    Close supervision   Minimal assistance           Device :       Axillary crutches           Status :    Goal met   Goal met           Date Met :    07/04/2016 EDT   07/04/2016 EDT             [facility], PT, ADDIE G - 07/12/2016 15:56 EDT  [facility], PT, ADDIE G - 07/12/2016 15:56 EDT        W/C Management Grid     Goal #1          Descriptors :    Manual wheelchair propulsion indoors              Level :    Modified independence              Distance :    150 ft              Status :    Goal met               Date Met :    07/04/2016 EDT                [facility], PT, ADDIE G - 07/12/2016 15:56 EDT         PT ST Goals Reviewed :   Chaney QUIVERS, PT, ADDIE G - 07/12/2016 15:56 EDT   Long Term Goals   PT Patient,Caregiver Goal :   Be able to go to my new job in [place], [place] in 10 days.   [facility], PT, ADDIE G - 07/12/2016 15:56 EDT   Mobility Goals Grid   Bed, Chair, Wheelchair Goal :   Modified independence   (Comment: met [facility], PT, ADDIE G - 07/12/2016 15:56 EDT] )   Toilet Transfer Goal :   Modified independence   (Comment: met [LYNCH, PT, ADDIE G - 07/12/2016 15:56 EDT] )   Ambulation Level Surfaces Goal :   Modified independence   (Comment: met [facility], PT, ADDIE G - 07/12/2016 15:56 EDT] )   Wheelchair Mobility Level Surfaces Goal :   Modified independence   (Comment: met [LYNCH, PT, ADDIE G - 07/12/2016 15:56 EDT] )   Stairs Ambulation Assistance Level Goal :   Modified independence   (Comment: not met [facility], PT, ADDIE G - 07/12/2016 15:56 EDT] )   LYNCH, PT, ADDIE G - 07/12/2016 15:56 EDT   PT LTG Reconcilation :  Ambulation NWB on RLE w both rolling knee walker   Type of Wheelchair Goal :   Manual wheelchair   Mode of Locomotion Goal :   Walk   PT LT Goals Reviewed :   Yes   LYNCH, PT, ADDIE G - 07/12/2016 15:56 EDT   Plan   Frequency :   Mo/Tu/We/Th/Fr/Sa   Duration :   1    PT Duration Unit Rehab :   Weeks   Treatments Planned :   Balance training, Equipment training, Functional training, Gait training, Pain management, Patient education, Stair training, Therapeutic activities, Therapeutic exercises, Transfer training, Wheelchair assessment and management   Treatment Plan/Goals Established With Patient/Caregiver :   Yes   Evaluation Complete :   Yes   LYNCH, PT, ADDIE G - 07/12/2016 15:56 EDT   Time Spent With Patient   PT Time In :   11:00 EST   PT Time Out :   11:30 EST   PT ADL TRAINING 15 MN :   2 units   PT ADL Training Time :   30 minutes   PT Total Individual Therapy Time :   30 minutes   PT Total Timed Code Treatment Units :   2  units   PT Total Timed Code Tx Minutes :   30 minutes   PT Total Treatment Time Rehab :   30 minutes   LYNCH, PT, ADDIE G - 07/12/2016 15:56 EDT   PT Units Cancelled Missed     PT Units Lost #1          Amount :    2               Reason :    Other: Pt refused group therapy                LYNCH, PT, ADDIE G - 07/12/2016 15:56 EDT         PT Minutes Grid     PT Minutes Lost #1          Amount :    30               Reason :    Other: Pt refused group therapy                LYNCH, PT, ADDIE G - 07/12/2016 15:56 EDT         Section GG: Discharge Mobility Functional Abilities   GG0170 Roll Left and Right :   Independent - Patient/Resident completes the activities by him/herself, with or without an assistive device, with no assistance from a helper. - 06   GG0170 Sit to Lying :   Independent - Patient/Resident completes the activities by him/herself, with or without an assistive device, with no assistance from a helper. - 06   GG0170 Lying to Sitting on Side of Bed :   Independent - Patient/Resident completes the activities by him/herself, with or without an assistive device, with no assistance from a helper. - 06   GG0170 Sit to Stand :   Independent - Patient/Resident completes the activities by him/herself, with or without an assistive device, with no assistance from a helper. - 06   GG0170 Chair,Bed to Chair Transfer :   Independent - Patient/Resident completes the activities by him/herself, with or without an assistive device, with no assistance from a helper. - 06   GG0170 Toilet Transfer :   Independent - Patient/Resident completes the activities by him/herself, with or without an  assistive device, with no assistance from a helper. - 06   GG0170 Car Transfer :   Independent - Patient/Resident completes the activities by him/herself, with or without an assistive device, with no assistance from a helper. - 06   GG0170 H3 Patient Walk :   Yes   GG0170 Walk 10 Feet :   Independent - Patient/Resident completes the activities  by him/herself, with or without an assistive device, with no assistance from a helper. - 06   GG0170 Walk 50 Feet with Two Turns :   Independent - Patient/Resident completes the activities by him/herself, with or without an assistive device, with no assistance from a helper. - 06   GG0170 Walk 150 Feet :   Independent - Patient/Resident completes the activities by him/herself, with or without an assistive device, with no assistance from a helper. - 06   GG0170 Walk 10 Feet Uneven Surfaces :   Not attempted due to medical condition or safety concerns - 88   GG0170 1 Step (Curb) :   Not attempted due to medical condition or safety concerns - 88   GG0170 4 Steps :   Setup or clean-up assistance - Helper SETS UP or CLEANS UP; Patient/Resident completes activity. Helper assists only prior to or following the activity. - 05   GG0170 12 Steps :   Setup or clean-up assistance - Helper SETS UP or CLEANS UP; Patient/Resident completes activity. Helper assists only prior to or following the activity. - 05   GG0170 Picking Up Object :   Independent - Patient/Resident completes the activities by him/herself, with or without an assistive device, with no assistance from a helper. - 06   [facility], PT, ADDIE G - 07/12/2016 15:56 EDT   Care Tool Section GG: Mobility Continued   GG0170 Patient Use Wheelchair,Scooter :   Yes   GG0170 Wheel 50 Feet with Two Turns :   Independent - Patient/Resident completes the activities by him/herself, with or without an assistive device, with no assistance from a helper. - 06   GG0170 Type Wheelchair,Scooter Use 36ft :   Manual wheelchair   GG0170 Wheel 150 feet :   Independent - Patient/Resident completes the activities by him/herself, with or without an assistive device, with no assistance from a helper. - 06   [id number] Type Wheelchair,Scooter Use 16ft :   Manual wheelchair   [facility], PT, ADDIE G - 07/12/2016 15:56 EDT

## 2016-07-12 NOTE — Progress Notes (Signed)
OT Inpatient Discharge Evaluation -Text       OT Inpatient Discharge Evaluation Entered On:  07/12/2016 16:47 EDT    Performed On:  07/12/2016 16:33 EDT by Audley Hose               Reason for Treatment   *Reason for Referral :   ADMITTING DIAGNOSES:  1.  Right bimalleolar ankle fracture on August 22nd.  2.  Status post open reduction internal fixation on August 23rd.  3.  History of gastric bypass surgery for obesity.  4.  Anemia    Patient had fall at home and did not get help for 12 hrs. Had surgery and is non wt bearing right ankle. Pt lives by self and is moving to [place] and needs to be mod I to start new job.          3 hr rule  -   NON WT BEARING RIGHT LE  -  FALL RISK     *Chief Complaint :   Decreased mobility secondary to non wt bearing on right LE.  Impacts transfers, ADLs including dressing, bathing, toileting.        Audley Hose - 07/12/2016 16:33 EDT   OT Basic ADL   Basic ADL Grid   Eating :   Modified independence   Grooming :   Complete independence   Bathing :   Modified independence   UE Dressing :   Complete independence   LE Dressing :   Contact guard assistance   Toileting :   Modified independence   Transfer Toilet :   Modified independence   Shower Transfer :   Supervision or setup   Audley Hose - 07/12/2016 16:33 EDT   ADL Comments :   Pt refused ADLs on 9/3. Shower ADL completed 8/30. Mod I for eating 2* dentures. Independent with grooming task. Bathing in shower utilizing grab bars/shower seat. Independent for UE dressing task. Mod I for toileting/toilet transfer/and bed<>wc transfer utilizing grab bars or rw. Supervision for shower transfer 2* dec'd standing balance on wet surface.        Audley Hose - 07/12/2016 16:33 EDT   UE ROM/Strength   Upper Extremity Overall ROM Grid   Left Upper Extremity Active Range :   Within functional limits   Left Upper Extremity Passive Range :   Within functional limits   Right Upper Extremity Active Range :   Within functional limits   Right Upper  Extremity Passive Range :   Within functional limits   Audley Hose - 07/12/2016 16:33 EDT   Lt Upper Extremity Strength :   Within functional limits   Audley Hose - 07/12/2016 16:33 EDT   Left Upper Extremity Strength Grid   Shoulder Flexion :   4+   Shoulder Abduction :   4+   Elbow Flexion :   4+   Elbow Extension :   4   Finger Flexion :   4+   Audley Hose - 07/12/2016 16:33 EDT   Rt Upper Extremity Strength :   Within functional limits   Audley Hose - 07/12/2016 16:33 EDT   Right Upper Extremity Strength Grid   Shoulder Flexion :   4+   Shoulder Abduction :   4+   Elbow Flexion :   4+   Elbow Extension :   4   Finger Flexion :   4+   Audley Hose - 07/12/2016 16:33 EDT  Education   Occupational Therapy Education Grid   Activity of Daily Living Training :   Information systems manager, Demonstrates   Home Safety :   Science writer understanding, Demonstrates   Audley Hose - 07/12/2016 16:33 EDT   Assessment   OT Impairments or Limitations :   Balance deficits, Other: wt bearing status   Barriers to Safe Discharge OT :   Limited family support, Other: Non Wt bearing status ;Patient moving to [facility] for new job next week.   OT Discharge Recommendations :   Home with DME equipment.  Patient would probably benenfit from Knee walker.      OT Treatment Recommendations :   Pt refused ADL d/c eval due to waiting on pain meds, but agreed to participate in 30 min. When OT returned in 30 minutes pt was bathed, dressed and groomed and reported completing these tasks with no assistance. Pt refused group therapy session in PM. Recommending d/c to home with supervision.     Audley Hose - 07/12/2016 16:33 EDT   Long Term Goals   OT Patient/Caregiver Goal :   To be able to safely get around and take care of self in order to move to new location.   Audley Hose - 07/12/2016 16:33 EDT   ADL Long Term Goals Grid   Grooming Goal :   Complete independence   (Comment: met Audley Hose - 07/12/2016 16:33 EDT] )   Bathing Goal :   Modified  independence   (Comment: met (sink bath) Audley Hose - 07/12/2016 16:33 EDT] )   Upper Extremity Dressing :   Complete independence   (Comment: met Audley Hose - 07/12/2016 16:33 EDT] )   Lower Body Dressing Goal :   Modified independence   (Comment: not met Audley Hose - 07/12/2016 16:33 EDT] )   Toileting Goal :   Modified independence   (Comment: not met Audley Hose - 07/12/2016 16:33 EDT] )   Toilet Transfer Goal :   Modified independence   (Comment: not met Audley Hose - 07/12/2016 16:33 EDT] )   Tub, Shower Transfer :   Modified independence   (Comment: not met Audley Hose - 07/12/2016 16:33 EDT] )   Audley Hose - 07/12/2016 16:33 EDT   Problem Solving Goal Indep Measure :   Modified independence   OT LT Goals Reviewed :   Darryl Lent - 07/12/2016 16:33 EDT   Short Term Goals   Bathing Goal Grid     Goal #1          Activity :    Tub transfer              Assist :    Modified independence              Equipment :    Tub bench              Status :    Initial              Comment :    Pt needs to work on this skill for new apartment.  (Comment: Goal not met. At d/c pt denied having funds to purchase tub transfer bench. Alternative options for obtaining bench were provided to patient. Pt reported prefering to take sink baths at this time.  Audley Hose - 07/12/2016 16:33 EDT] )  Audley Hose - 07/12/2016 16:33 EDT         OT ST Goals Reviewed :   Darryl Lent - 07/12/2016 16:33 EDT   Plan   OT Evaluation Date :   07/12/2016 EDT   Frequency :   Daily   OT Duration Unit Rehab :   Weeks   Duration Unit :   1   Therapy Activity Tolerance :   3 hours over 5 days   Planned Treatments :   Discharge/Discontinue OT treatment   Treatment Plan/Goals Established With Patient/Caregiver :   Yes   OT Evaluation Complete :   Yes   Audley Hose - 07/12/2016 16:33 EDT   Time Spent With Patient   OT Time In :   9:30 EST   OT Time Out :   10:00 EST   OT ADL TRAINING 15 MIN :   2    OT ADL Training Minutes :   30  minutes   OT Total Individual Therapy Time :   30 minutes   OT Total Timed Code Treatment Units :   2 units   OT Total Timed Code Treatment Minutes :   30 minutes   OT Total Treatment Time Rehab :   30 minutes   Audley Hose - 07/12/2016 16:33 EDT   OT Units Cancelled Missed     OT Units Lost #1          Amount :    4               Reason :    Jannett Celestine - 07/12/2016 16:33 EDT         OT Minutes Cancelled Missed Grid     OT Minutes Lost #1          Amount :    60               Reason :    Jannett Celestine - 07/12/2016 16:33 EDT         Care Tool Section GG: Self Care Functional Abilities   OT Care Tool Progress :   Independent - Patient/Resident completes the activities by him/herself, with or without an assistive device, with no assistance from a helper. - 06   GG0130 Oral Hygiene :   End[place] completes the activities by him/herself, with or without an assistive device, with no assistance from a helper. - 06   GG0130 Toileting Hygiene :   Setup or clean-up assistance - Helper SETS UP or CLEANS UP; Patient/Resident completes activity. Helper assists only prior to or following the activity. - 05   GG0170 Toilet Transfer :   Setup or clean-up assistance - Helper SETS UP or CLEANS UP; Patient/Resident completes activity. Helper assists only prior to or following the activity. - 05   GG0130 Shower, Bathe Self :   Independent - Patient/Resident completes the activities by him/herself, with or without an assistive device, with no assistance from a helper. - 06   GG0130 Upper Body Dressing :   Independent - Patient/Resident completes the activities by him/herself, with or without an assistive device, with no assistance from a helper. - 06   GG0130 Lower Body Dressing :   Supervision or touching assistance - Helper  provides VERBAL CUES or TOUCHING/STEADYING assistance as patient/resident completes activity. Assistance may be provided throughout the activity or  intermittently. - 04   GG0130 Putting On, Taking Off Footwear :   Supervision or touching assistance - Helper provides VERBAL CUES or TOUCHING/STEADYING assistance as patient/resident completes activity. Assistance may be provided throughout the activity or intermittently. - [address]   Audley Hose - 07/12/2016 16:33 EDT

## 2016-07-12 NOTE — Progress Notes (Signed)
Functional Indep Measure Scores - Text       Functional Independence Measure Scores Entered On:  07/12/2016 16:50 EDT    Performed On:  07/12/2016 16:48 EDT by Christena Deem               Grooming Score   Patient's Independence Level with Grooming Tasks :   Independent/Modified independence   InM Grooming Indep or Modifications :   Complete independence   Grooming :   Complete independence   Christena Deem - 07/12/2016 16:48 EDT   Bathing Score   Patient's Independence Level with Bathing Tasks :   Independent/Modified independence   Independent or Modifications Needed :   Assistive device(s), manages independently   Functional Independence Measure Bathing :   Modified independence   Christena Deem - 07/12/2016 16:48 EDT   Upper Body Dressing Score   Patient's Independence Level with Upper Body Dressing Tasks :   Independent/Modified independence   Independent or Modifications Needed :   Complete independence   UE Dressing :   Complete independence   Christena Deem - 07/12/2016 16:48 EDT   Lower Body Dressing Score   Patient's independence Level with Lower Body Dressing Tasks :   Assistance   Type of Assistance Necessary :   Incidental help for contact guard or steadying   LE Dressing :   Minimal contact assistance   Christena Deem - 07/12/2016 16:48 EDT   Toileting Score   Patient's Independence Level with Toileting Tasks :   Setup/Supervision   Supervision or Setup Needed :   Standby prompting   Toileting :   Supervision or setup   Christena Deem - 07/12/2016 16:48 EDT   Transfer Bed/Chair/WC Score   Patient's independence Level with Bed, Chair, Wheelchair Tasks :   Independent/Modified independence   Independent or Modifications Needed :   Assistive device(s), manages independently   Bed, Chair, Wheelchair Transfer :   Modified independence   Christena Deem - 07/12/2016 16:48 EDT   Transfer Toilet Score   Patient's Independence Level with Transfer Toilet Tasks :   Setup/Supervision   Supervision or Setup Needed :   Standby prompting    Transfer Toilet :   Supervision or setup   Christena Deem - 07/12/2016 16:48 EDT   Comprehension Score   Mode of Comprehension :   Auditory   Comprehends Complex or Abstract Information Without Prompting or Cueing :   Yes   Understands Complex or Abstract Directions and Conversations :   At all times   Comprehension Indep Measure Interim :   Complete independence   Christena Deem - 07/12/2016 16:48 EDT   Expression Score   Expression Mode :   Vocal   Expresses Complex or Abstract Information Without Prompting or Cueing :   Yes   Expresses Complex or Abstract Ideas :   Clearly and fluently at all times   Expression Indep Measure Interim :   Complete independence   Christena Deem - 07/12/2016 16:48 EDT   Social Interaction Score   Interacts Appropriately Without Supervision :   Yes   Interacts Appropriately :   At all times   Social Interaction Indep Measure Interim :   Complete independence   Christena Deem - 07/12/2016 16:48 EDT   Problem Solving Score   Solves Complex Problems :   Yes   Ability to Solve Complex Problems :   Consistently solves problems independently   Problem Solving Indep  Measure Interim :   Complete independence   Christena Deem - 07/12/2016 16:48 EDT   Memory Score   Recognizes, Remembers Routines, and Executes Requests Without Prompting :   Yes   Remembers and Executes Requests :   Consistently without need for repetition   Memory Indep Measure Interim :   Complete independence   Christena Deem - 07/12/2016 16:48 EDT

## 2016-07-12 NOTE — Nursing Note (Signed)
Medication Administration Follow Up-Text       Medication Administration Follow Up Entered On:  07/12/2016 23:58 EDT    Performed On:  07/12/2016 23:32 EDT by Azucena Kuba, RN, VANESSA      Intervention Information:     lorazepam  Performed by Azucena Kuba RN, E[place] Noe on 07/12/2016 22:32:00 EDT       lorazepam,1mg   Oral,anxiety       Medication Effectiveness Evaluation   Medication Administration Reason :   Anxiety   Medication Effective :   Yes   Medication Response :   Symptoms improved   Azucena Kuba RN, [place] Noe - 07/12/2016 23:58 EDT

## 2016-07-13 NOTE — Discharge Summary (Signed)
 Inpatient Patient Summary               [facility]  [address]  [place], [place] [address]  [phone]  Patient Discharge Instructions     Name: Anita Vazquez, Anita Vazquez [facility]  Current Date: 07/13/2016 10:01:14  DOB: 29-Aug-1956 MRN: [id number] FIN: NBR%>[phone]  Patient Address: [address] [place] [facility] [address]  Patient Phone: [phone]  Primary Care Provider:  Name: Pcp, None  Phone:    Immunizations Provided:       Discharge Diagnosis: 1:Closed bimalleolar fracture; 2:S/P ORIF (open reduction internal fixation) fracture; 3:Anemia; 4:History of gastric bypass  Discharged To: TO, ANTICIPATED%>Home independently  Home Treatments: TREATMENTS, ANTICIPATED%>  Devices/Equipment: EQUIPMENT REHAB%>Wheelchair - Manual  Post Hospital Services: HOSPITAL SERVICES%>PATIENT MOVING TO [place] TO START A NEW JOB        PATIENT WILL OBTAIN A WHEELCHAIR FROM VENDOR IN [place]:  [employer] will be reaching out to pt via cell # to let her know when wheelchair will be available in [place] (ph [phone] [address] [place])  Pt also has wheelchair RX in hand if needed.      Professional Skilled Services: SKILLED SERVICES%>  Therapist, sports and Community Resources:               SERV AND COMM RES, ANTICIPATED%>  Mode of Discharge Transportation: TRANSPORTATION%>  Discharge Orders         Discharge Patient 07/13/16 7:13:00 EDT, Discharge Home/Self Care         Comment:      Medications   During the course of your visit, your medication list was updated with the most current information. The details of those changes are reflected below:         New Medications  Other Medications  LORazepam (Ativan 1 mg oral tablet) 1 Tabs Oral (given by mouth) 3 times a day as needed anxiety.  Last Dose:____________________  oxyCODONE (oxyCODONE 5 mg oral tablet) 4 Tabs Oral (given by mouth) every 6 hours as needed moderate pain (4-7).  Last Dose:____________________  No Longer Take the Following  Medications  acetaminophen-oxyCODONE (Percocet 5/325 oral tablet) 1-2 tabs Oral (given by mouth) every 6 hours as needed moderate pain (4-7). Refills: 0., MAX DAILY DOSE OF ACETAMINOPHEN = 3000 MG  Stop Taking Reason: Physician Request  acetaminophen-oxyCODONE (Percocet 5/325 oral tablet) 1 Tabs Oral (given by mouth) every 6 hours as needed moderate pain (4-7).  Stop Taking Reason: Physician Request         [facility] would like to thank you for allowing us  to assist you with your healthcare needs. The following includes patient education materials and information regarding your injury/illness.     Vazquez, Anita CHRISTINE has been given the following list of follow-up instructions, prescriptions, and patient education materials:  Follow-up Instructions:             With: Address: When:   MICHAEL WILDSTEIN-MD *[address] [place]  [address]  [phone] Business (1) Within 1 to 2 weeks   Comments:   CALL FOR APPOINTMENT       With: Address: When:   STEPHEN BUSBY-MD [address]  [place], [place]  [address]  [phone] Business (1) Within 1 week   Comments:   CALL FOR APPOINTMENT       With: Address: When:   HANDICAP PARKING APPLICATION GIVEN TO PATIENT. TEMPORARY 9 MONTHS  It is important to always keep an active list of medications available so that you can share with other providers and manage your medications appropriately. As an additional courtesy, we are also providing you with your final active medications list that you can keep with you.           LORazepam (Ativan 1 mg oral tablet) 1 Tabs Oral (given by mouth) 3 times a day as needed anxiety.  oxyCODONE (oxyCODONE 5 mg oral tablet) 4 Tabs Oral (given by mouth) every 6 hours as needed moderate pain (4-7).      Take only the medications listed above. Contact your doctor prior to taking any medications not on this list.        Discharge instructions, if any, will display below     Instructions for Diet: INSTRUCTIONS  FOR DIET%>   Instructions for Supplements: SUPPLEMENT INSTRUCTIONS%>   Instructions for Activity: INSTRUCTIONS FOR ACTIVITY%>   Instructions for Wound Care: INSTRUCTIONS FOR WOUND CARE%>     Medication leaflets, if any, will display below     Patient education materials, if any, will display below        Understanding Ankle Fracture Open Reduction and Internal Fixation   Open reduction and internal fixation (ORIF) is a type of treatment to fix a broken bone. It puts the pieces of a broken bone back together so they can heal. Open reduction means the bones are put back in place during a surgery. Internal fixation means that special hardware is used to hold the bone pieces together. This helps the bone heals correctly. The procedure is done by an orthopedic surgeon. This is a doctor with special training in treating bone, joint, and muscle problems.   How an ankle fracture happens   Three bones make up the ankle joint. These are the shinbone (tibia), the smaller bone in your leg (fibula), and a bone in your foot (talus).   Different kinds of injury can damage the lower tibia, lower fibula, or talus. In some cases, only 1 of these bones might break. Or you may have a break in 2 or more of these bones. The bones may break, but the pieces are still lined up correctly. Or they may be broken and not lined up correctly.   Why ankle fracture ORIF is done   You are more likely to need ORIF if:    The bones of your leg are very out of alignment    One or more bones broke through the skin    Your bones broke into several pieces    Your ankle is unstable   How ankle fracture ORIF is done   During an open reduction, the bone pieces are put back in their proper alignment. The bones are then connected back in place with hardware. This is called internal fixation. The hardware may include screws, plates, rods, wires, or nails.   Risks of ankle fracture ORIF   All surgery has risks. The risks of ankle fracture ORIF include:     Infection    Bleeding    Nerve damage    Skin complications    Misaligned bone    Blood clots    Fat embolism    Irritation of area from the hardware    Problems from anesthesia    Need for additional surgery   Your risks vary based on your age and general health. For example, if you are a smoker or if you have low bone density, you may  have a higher risk for certain problems. People with diabetes that is not controlled well may also have a higher risk for problems. Talk with your healthcare provider about which risks apply most to you.      2000-2017 [employer] [address] [place], [place] [address]. All rights reserved. This information is not intended as a substitute for professional medical care. Always follow your healthcare professional's instructions.         Cast Care       Supporting your cast with a provided a sling or crutches can help with healing.     Your healthcare provider just gave you a cast made of plaster or fiberglass. This cast will hold your arm or leg in place to help it heal. Though it might feel a bit awkward at first, youll soon get used to it. During the coming days and weeks, the way you treat your cast can play a big part in how fast and how well you heal.   Keep the cast dry   If a plaster cast gets wet, it can soften and fall apart. And if the padding of a fiberglass cast gets wet, it can irritate and damage your skin. So your cast must stay dry.    Avoid activities that can get your cast wet. These include swimming, fishing, washing dishes, and even going out in the rain.    Bathe as directed by your healthcare provider. When you bathe, keep your cast out of water and wrapped in plastic.    Dont soak your cast in water, even if its wrapped in plastic.    If your cast does get wet, try drying it as soon as possible. To do this, use a hair dryer set to cool. Call your healthcare provider if your cast doesnt dry within 24 hours.   Handle with care    For the best results, remember the following:   Do    Do keep the cast clean and dry. Cover it with plastic to protect it when around dirt or water.    Do use any support you are given, such as crutches or a sling.    Do elevate the cast above your heart whenever possible.   Dont    Dont slide anything inside the cast, even to scratch your skin.    Dont put lotions or powders around the cast or inside it.    Dont hit the cast.    Dont cut the cast or pull it apart.    Dont wash the cast.   When to call your healthcare provider   Call your healthcare provider right away if you have any of the following:    Swelling or cast tightness that does not improve with elevation    If your cast breaks    If your cast gets wet and cannot be dried    If you have increasing pain, numbness, or tingling      2000-2017 [employer] [address] [place], [place] [address]. All rights reserved. This information is not intended as a substitute for professional medical care. Always follow your healthcare professional's instructions.             Oxycodone tablets or capsules   What is this medicine?   OXYCODONE (ox i KOE done) is a pain reliever. It is used to treat moderate to severe pain.   How should I use this medicine?   Take this medicine  by mouth with a glass of water. Follow the directions on the prescription label. You can take it with or without food. If it upsets your stomach, take it with food. Take your medicine at regular intervals. Do not take it more often than directed. Do not stop taking except on your doctor's advice.   Some brands of this medicine, like Oxecta, have special instructions. Ask your doctor or pharmacist if these directions are for you: Do not cut, crush or chew this medicine. Swallow only one tablet at a time. Do not wet, soak, or lick the tablet before you take it.   A special MedGuide will be given to you by the pharmacist with each prescription and refill. Be sure to read  this information carefully each time.   Talk to your pediatrician regarding the use of this medicine in children. Special care may be needed.   What side effects may I notice from receiving this medicine?   Side effects that you should report to your doctor or health care professional as soon as possible:      allergic reactions like skin rash, itching or hives, swelling of the face, lips, or tongue      breathing problems      confusion      signs and symptoms of low blood pressure like dizziness; feeling faint or lightheaded, falls; unusually weak or tired      trouble passing urine or change in the amount of urine      trouble swallowing     Side effects that usually do not require medical attention (report to your doctor or health care professional if they continue or are bothersome):      constipation      dry mouth      nausea, vomiting      tiredness    What may interact with this medicine?   This medicine may interact with the following medications:      alcohol      antihistamines for allergy, cough and cold      antiviral medicines for HIV or AIDS      atropine      certain antibiotics like clarithromycin, erythromycin, linezolid, rifampin      certain medicines for anxiety or sleep      certain medicines for bladder problems like oxybutynin, tolterodine      certain medicines for depression like amitriptyline, fluoxetine, sertraline      certain medicines for fungal infections like ketoconazole, itraconazole, voriconazole      certain medicines for migraine headache like almotriptan, eletriptan, frovatriptan, naratriptan, rizatriptan, sumatriptan, zolmitriptan      certain medicines for nausea or vomiting like dolasetron, ondansetron, palonosetron      certain medicines for Parkinson's disease like benztropine, trihexyphenidyl      certain medicines for seizures like phenobarbital, phenytoin, primidone      certain medicines for stomach problems like dicyclomine, hyoscyamine       certain medicines for travel sickness like scopolamine      diuretics      general anesthetics like halothane, isoflurane, methoxyflurane, propofol      ipratropium      local anesthetics like lidocaine, pramoxine, tetracaine      MAOIs like Carbex, Eldepryl, Marplan, Nardil, and Parnate      medicines that relax muscles for surgery      methylene blue      nilotinib      other narcotic medicines for pain or cough  phenothiazines like chlorpromazine, mesoridazine, prochlorperazine, thioridazine    What if I miss a dose?   If you miss a dose, take it as soon as you can. If it is almost time for your next dose, take only that dose. Do not take double or extra doses.   Where should I keep my medicine?   Keep out of the reach of children. This medicine can be abused. Keep your medicine in a safe place to protect it from theft. Do not share this medicine with anyone. Selling or giving away this medicine is dangerous and against the law.   Store at room temperature between 15 and 30 degrees C (59 and 86 degrees F). Protect from light. Keep container tightly closed.   This medicine may cause accidental overdose and death if it is taken by other adults, children, or pets. Flush any unused medicine down the toilet to reduce the chance of harm. Do not use the medicine after the expiration date.   What should I tell my health care provider before I take this medicine?   They need to know if you have any of these conditions:      Addison's disease      brain tumor      head injury      heart disease      history of drug or alcohol abuse problem      if you often drink alcohol      kidney disease      liver disease      lung or breathing disease, like asthma      mental illness      pancreatic disease      seizures      thyroid disease      an unusual or allergic reaction to oxycodone, codeine, hydrocodone, morphine, other medicines, foods, dyes, or preservatives      pregnant or trying to get  pregnant      breast-feeding    What should I watch for while using this medicine?   Tell your doctor or health care professional if your pain does not go away, if it gets worse, or if you have new or a different type of pain. You may develop tolerance to the medicine. Tolerance means that you will need a higher dose of the medicine for pain relief. Tolerance is normal and is expected if you take this medicine for a long time.   Do not suddenly stop taking your medicine because you may develop a severe reaction. Your body becomes used to the medicine. This does NOT mean you are addicted. Addiction is a behavior related to getting and using a drug for a non-medical reason. If you have pain, you have a medical reason to take pain medicine. Your doctor will tell you how much medicine to take. If your doctor wants you to stop the medicine, the dose will be slowly lowered over time to avoid any side effects.   There are different types of narcotic medicines (opiates). If you take more than one type at the same time or if you are taking another medicine that also causes drowsiness, you may have more side effects. Give your health care provider a list of all medicines you use. Your doctor will tell you how much medicine to take. Do not take more medicine than directed. Call emergency for help if you have problems breathing or unusual sleepiness.   You may get drowsy or dizzy. Do not drive, use machinery, or  do anything that needs mental alertness until you know how the medicine affects you. Do not stand or sit up quickly, especially if you are an older patient. This reduces the risk of dizzy or fainting spells. Alcohol may interfere with the effect of this medicine. Avoid alcoholic drinks.   This medicine will cause constipation. Try to have a bowel movement at least every 2 to 3 days. If you do not have a bowel movement for 3 days, call your doctor or health care professional.   Your mouth may get dry. Chewing sugarless  gum or sucking hard candy, and drinking plenty of water may help. Contact your doctor if the problem does not go away or is severe.          NOTE:This sheet is a summary. It may not cover all possible information. If you have questions about this medicine, talk to your doctor, pharmacist, or health care provider. Copyright 2017 [employer]             Lorazepam tablets   What is this medicine?   LORAZEPAM (lor A ze pam) is a benzodiazepine. It is used to treat anxiety.   How should I use this medicine?   Take this medicine by mouth with a glass of water. Follow the directions on the prescription label. Take your medicine at regular intervals. Do not take it more often than directed. Do not stop taking except on your doctor's advice.   A special MedGuide will be given to you by the pharmacist with each prescription and refill. Be sure to read this information carefully each time.   Talk to your pediatrician regarding the use of this medicine in children. While this drug may be used in children as young as 12 years for selected conditions, precautions do apply.   What side effects may I notice from receiving this medicine?   Side effects that you should report to your doctor or health care professional as soon as possible:      allergic reactions like skin rash, itching or hives, swelling of the face, lips, or tongue      breathing problems      confusion      loss of balance or coordination      signs and symptoms of low blood pressure like dizziness; feeling faint or lightheaded, falls; unusually weak or tired      suicidal thoughts or other mood changes     Side effects that usually do not require medical attention (report to your doctor or health care professional if they continue or are bothersome):      dizziness      headache      nausea, vomiting      tiredness    What may interact with this medicine?   Do not take this medicine with any of the following medications:      narcotic medicines for  cough      sodium oxybate     This medicine may also interact with the following medications:      alcohol      antihistamines for allergy, cough and cold      certain medicines for anxiety or sleep      certain medicines for depression, like amitriptyline, fluoxetine, sertraline      certain medicines for seizures like carbamazepine, phenobarbital, phenytoin, primidone      general anesthetics like lidocaine, pramoxine, tetracaine      MAOIs like Carbex, Eldepryl, Marplan, Nardil, and Parnate  medicines that relax muscles for surgery      narcotic medicines for pain      phenothiazines like chlorpromazine, mesoridazine, prochlorperazine, thioridazine    What if I miss a dose?   If you miss a dose, take it as soon as you can. If it is almost time for your next dose, take only that dose. Do not take double or extra doses.   Where should I keep my medicine?   Keep out of the reach of children. This medicine can be abused. Keep your medicine in a safe place to protect it from theft. Do not share this medicine with anyone. Selling or giving away this medicine is dangerous and against the law.   This medicine may cause accidental overdose and death if taken by other adults, children, or pets. Mix any unused medicine with a substance like cat litter or coffee grounds. Then throw the medicine away in a sealed container like a sealed bag or a coffee can with a lid. Do not use the medicine after the expiration date.   Store at room temperature between 20 and 25 degrees C (68 and 77 degrees F). Protect from light. Keep container tightly closed.   What should I tell my health care provider before I take this medicine?   They need to know if you have any of these conditions:      glaucoma      history of drug or alcohol abuse problem      kidney disease      liver disease      lung or breathing disease, like asthma      mental illness      myasthenia gravis      Parkinson's disease      suicidal  thoughts, plans, or attempt; a previous suicide attempt by you or a family member      an unusual or allergic reaction to lorazepam, other medicines, foods, dyes, or preservatives      pregnant or trying to get pregnant      breast-feeding    What should I watch for while using this medicine?   Tell your doctor or health care professional if your symptoms do not start to get better or if they get worse.   Do not stop taking except on your doctor's advice. You may develop a severe reaction. Your doctor will tell you how much medicine to take.   You may get drowsy or dizzy. Do not drive, use machinery, or do anything that needs mental alertness until you know how this medicine affects you. To reduce the risk of dizzy and fainting spells, do not stand or sit up quickly, especially if you are an older patient. Alcohol may increase dizziness and drowsiness. Avoid alcoholic drinks.   If you are taking another medicine that also causes drowsiness, you may have more side effects. Give your health care provider a list of all medicines you use. Your doctor will tell you how much medicine to take. Do not take more medicine than directed. Call emergency for help if you have problems breathing or unusual sleepiness.          NOTE:This sheet is a summary. It may not cover all possible information. If you have questions about this medicine, talk to your doctor, pharmacist, or health care provider. Copyright 2017 [employer]            IS IT A STROKE? Act FAST and Check for these signs:  FACE                         Does the face look uneven?    ARM                         Does one arm drift down?    SPEECH                    Does their speech sound strange?    TIME                         Call 9-1-1 at any sign of stroke  Heart Attack Signs  Chest discomfort: Most heart attacks involve discomfort in the center of the chest and lasts more than a few minutes, or goes away and comes back. It can feel like uncomfortable  pressure, squeezing, fullness or pain.  Discomfort in upper body: Symptoms can include pain or discomfort in one or both arms, back, neck, jaw or stomach.  Shortness of breath: With or without discomfort.  Other signs: Breaking out in a cold sweat, nausea, or lightheaded.  Remember, MINUTES DO MATTER. If you experience any of these heart attack warning signs, call 9-1-1 to get immediate medical attention!     ---------------------------------------------------------------------------------------------------------------------  [facility] allows you to manage your health, view your test results, and retrieve your discharge documents from your hospital stay securely and conveniently from your computer.  To begin the enrollment process, visit [email or web address] Click on "Sign up now" under [facility].   Yes - Patient/Family/Caregiver demonstrates understanding of instructions given        ______________________________                                 ___________________    Patient/Family/ Caregiver Signature                                                           Date/Time     ______________________________                                 ___________________    Provider Signature                                                                                         Date/Time

## 2016-07-13 NOTE — Progress Notes (Signed)
Functional Indep Measure Scores - Text       Functional Independence Measure Scores Entered On:  07/13/2016 11:47 EDT    Performed On:  07/13/2016 11:46 EDT by Farrel DemarkMitchum, RN, Christina L               Eating Score   Patient's Independence Level With Eating Tasks :   Independent/Modified independence   Independent or Modifications Needed :   Complete independence   Functional Independence Measure Eating :   Complete independence   Mitchum, RN, Christina L - 07/13/2016 11:46 EDT   Toileting Score   Patient's Independence Level with Toileting Tasks :   Independent/Modified independence   Independent or Modifications Needed :   Assistive device(s), manages independently   Toileting :   Modified independence   Mitchum, RN, Christina L - 07/13/2016 11:46 EDT   Bladder Management Score   Patient's Independence Level with Bladder Management Tasks :   Independent/Modified independence   Independent or Modifications Needed :   Complete independence   Functional Independence Measure Bladder Management :   Complete independence   Mitchum, RN, Christina L - 07/13/2016 11:46 EDT   Bowel Management Score   Patient's Independence Level with Bowel Management Tasks :   Independent/Modified independence   InM Bowel Mgmt Indep or Modifications :   Complete independence   Functional Independence Measure Bowel Management :   Complete independence   Mitchum, RN, Elvin SoChristina L - 07/13/2016 11:46 EDT   Transfer Bed/Chair/WC Score   Patient's independence Level with Bed, Chair, Wheelchair Tasks :   Independent/Modified independence   Independent or Modifications Needed :   Assistive device(s), manages independently, Sports administratorWalker   Bed, Chair, Wheelchair Transfer :   Modified independence   Mitchum, RN, Elvin SoChristina L - 07/13/2016 11:46 EDT   Transfer Toilet Score   Patient's Independence Level with Transfer Toilet Tasks :   Independent/Modified independence   Independent or Modifications Needed :   Assistive device(s), manages independently, Multimedia programmerWalker   Transfer Toilet :    Modified independence   Mitchum, RN, Christina L - 07/13/2016 11:46 EDT

## 2016-07-13 NOTE — Case Communication (Signed)
 CM Discharge Planning Assessment - Text       CM Discharge Planning Ongoing Assessment Entered On:  07/13/2016 9:17 EDT    Performed On:  07/13/2016 9:10 EDT by DERRICK,  KATHRYN T               Discharge Needs I   Previously Documented Discharge Needs :   DISCHARGE PLAN/NEEDS:  Anticipated Discharge Date: 07/13/2016 - Anita Vazquez - 07/10/16 16:48:00  Discharge To, Anticipated: Home independently - Anita Vazquez - 07/10/16 16:48:00  Needs Assistance with Transportation: Yes Anita Vazquez - 07/10/16 16:48:00  EQUIPMENT/TREATMENT NEEDS:  Needs Assistance at Home Upon Discharge: No Anita Vazquez - 07/10/16 16:48:00       Previously Documented Benefits Information :   Performed By: Anita Vazquez  - 07/03/16 10:39:00       Anticipated Discharge Date :   07/13/2016 EDT   Anticipated Discharge Time Slot :   1000-1200   Discharge To :   Home independently   CM Progress Note :   Anita Vazquez is a 60 year old female.  Anita Vazquez recently accepted a new job in [place]. that she needs to be mobile (able to carry over 30lbs) and her new apartment is on the 3rd floor.  Anita Vazquez is upset over her current financial concerns and being homeless by this weekend.  TC will be held on 07/06/2016.    07/06/2016  SW met with Anita Vazquez to present ITC-Summary.  Anita Vazquez verbalized being upset about being homeless as of this weekend and how she will not be able to afford DME.  Anita Vazquez just accepted a new job in [place]. & she is expected to start work on 07/14/2016.  Anita Vazquez was upset when presented with anticipated D/C date, as evidenced by her tearsfulness.  SW spoke with her about exploring asking family for assistance, but she does not want to bother or ask for money.  SW made other suggestions that Anita Vazquez discounted.  SW will continue to assist.  Insurance updated on 07/10/2016.  vcd     DERRICK,  KATHRYN T - 07/13/2016 9:10 EDT   Discharge Needs II   Home Equipment Rehab :   Wheelchair - Manual   Needs Assistance with Transportation :   Yes   Needs Assistance at Home Upon  Discharge :   No   Discharge Planning Time Spent :   60 minutes   DERRICK,  KATHRYN T - 07/13/2016 9:10 EDT   Discharge Planning   Discharge Arrangements :       Patient Post-Acute Information    Patient Name: Anita Vazquez, Anita Vazquez  MRN: [id number]  FIN: [id number]  Gender: Female  DOB: 22-Sep-2056  Age:  60 Years        *** No Post-Acute Placement(Vazquez) Listed ***          *** No Post-Acute Service(Vazquez) Listed Nurse, mental health Hospital Services :   PATIENT MOVING TO [place] TO START A NEW JOB    PATIENT WILL OBTAIN A WHEELCHAIR FROM VENDOR IN [place]:  [facility] will be reaching out to Anita Vazquez via cell # to let her know when wheelchair will be available in [place] (ph [phone] [address] [place])     Discharge Options Discussed with Patient :   DME   Discharge Plan Discussion :   Discussed with patient, Patient agrees with plan   DERRICK,  KATHRYN T -  07/13/2016 9:10 EDT

## 2016-07-13 NOTE — Discharge Summary (Signed)
 Inpatient Clinical Summary             [facility]  Post-Acute Care Transfer Instructions  PERSON INFORMATION   Name: Anita Vazquez, Anita Vazquez   MRN: [id number]    FIN#: [id number]   PHYSICIANS  Admitting Physician: MARZETTE LYNWOOD BRAVO  Attending Physician: MARZETTE LYNWOOD BRAVO   PCP: Pcp, None  Discharge Diagnosis: 1:Closed bimalleolar fracture; 2:S/P ORIF (open reduction internal fixation) fracture; 3:Anemia; 4:History of gastric bypass  Comment:       PATIENT EDUCATION INFORMATION  Instructions:             Ankle Fracture Open Reduction and Internal Fixation; Cast Care; Oxycodone tablets or capsules; Lorazepam tablets  Medication Leaflets:               Follow-up:                          With: Address: When:   [facility] *[address] [place]  [address]  [phone] Business (1) Within 1 to 2 weeks   Comments:   CALL FOR APPOINTMENT       With: Address: When:   [facility]PH[facility] BUSBY-MD [address]  [place], [place]  [address]  [phone] Business (1) Within 1 week   Comments:   CALL FOR APPOINTMENT       With: Address: When:   HANDICAP PARKING APPLICATION GIVEN TO PATIENT. TEMPORARY 9 MONTHS                             MEDICATION LIST  Medication Reconciliation at Discharge:         New Medications  Other Medications  LORazepam (Ativan 1 mg oral tablet) 1 Tabs Oral (given by mouth) 3 times a day as needed anxiety.  Last Dose:____________________  oxyCODONE (oxyCODONE 5 mg oral tablet) 4 Tabs Oral (given by mouth) every 6 hours as needed moderate pain (4-7).  Last Dose:____________________  No Longer Take the Following Medications  acetaminophen-oxyCODONE (Percocet 5/325 oral tablet) 1-2 tabs Oral (given by mouth) every 6 hours as needed moderate pain (4-7). Refills: 0., MAX DAILY DOSE OF ACETAMINOPHEN = 3000 MG  Stop Taking Reason: Physician Request  acetaminophen-oxyCODONE (Percocet 5/325 oral tablet) 1 Tabs Oral (given by mouth) every 6 hours as needed moderate pain  (4-7).  Stop Taking Reason: Physician Request         Patient's Final Home Medication List Upon Discharge:          LORazepam (Ativan 1 mg oral tablet) 1 Tabs Oral (given by mouth) 3 times a day as needed anxiety.  oxyCODONE (oxyCODONE 5 mg oral tablet) 4 Tabs Oral (given by mouth) every 6 hours as needed moderate pain (4-7).         Comment:       ORDERS         Order Name Order Details   Discharge Patient 07/13/16 7:13:00 EDT, Discharge Home/Self Care

## 2016-07-13 NOTE — Care Plan (Signed)
 [facility]     Patient Education Materials Follows:  [employer]            Lorazepam tablets   What is this medicine?   LORAZEPAM (lor A ze pam) is a benzodiazepine. It is used to treat anxiety.   How should I use this medicine?   Take this medicine by mouth with a glass of water. Follow the directions on the prescription label. Take your medicine at regular intervals. Do not take it more often than directed. Do not stop taking except on your doctor's advice.   A special MedGuide will be given to you by the pharmacist with each prescription and refill. Be sure to read this information carefully each time.   Talk to your pediatrician regarding the use of this medicine in children. While this drug may be used in children as young as 12 years for selected conditions, precautions do apply.   What side effects may I notice from receiving this medicine?   Side effects that you should report to your doctor or health care professional as soon as possible:      allergic reactions like skin rash, itching or hives, swelling of the face, lips, or tongue      breathing problems      confusion      loss of balance or coordination      signs and symptoms of low blood pressure like dizziness; feeling faint or lightheaded, falls; unusually weak or tired      suicidal thoughts or other mood changes     Side effects that usually do not require medical attention (report to your doctor or health care professional if they continue or are bothersome):      dizziness      headache      nausea, vomiting      tiredness    What may interact with this medicine?   Do not take this medicine with any of the following medications:      narcotic medicines for cough      sodium oxybate     This medicine may also interact with the following medications:      alcohol      antihistamines for allergy, cough and cold      certain medicines for anxiety or sleep      certain medicines for depression, like amitriptyline, fluoxetine,  sertraline      certain medicines for seizures like carbamazepine, phenobarbital, phenytoin, primidone      general anesthetics like lidocaine, pramoxine, tetracaine      MAOIs like Carbex, Eldepryl, Marplan, Nardil, and Parnate      medicines that relax muscles for surgery      narcotic medicines for pain      phenothiazines like chlorpromazine, mesoridazine, prochlorperazine, thioridazine    What if I miss a dose?   If you miss a dose, take it as soon as you can. If it is almost time for your next dose, take only that dose. Do not take double or extra doses.   Where should I keep my medicine?   Keep out of the reach of children. This medicine can be abused. Keep your medicine in a safe place to protect it from theft. Do not share this medicine with anyone. Selling or giving away this medicine is dangerous and against the law.   This medicine may cause accidental overdose and death if taken by other adults, children, or pets. Mix any unused medicine with a  substance like cat litter or coffee grounds. Then throw the medicine away in a sealed container like a sealed bag or a coffee can with a lid. Do not use the medicine after the expiration date.   Store at room temperature between 20 and 25 degrees C (68 and 77 degrees F). Protect from light. Keep container tightly closed.   What should I tell my health care provider before I take this medicine?   They need to know if you have any of these conditions:      glaucoma      history of drug or alcohol abuse problem      kidney disease      liver disease      lung or breathing disease, like asthma      mental illness      myasthenia gravis      Parkinson's disease      suicidal thoughts, plans, or attempt; a previous suicide attempt by you or a family member      an unusual or allergic reaction to lorazepam, other medicines, foods, dyes, or preservatives      pregnant or trying to get pregnant      breast-feeding    What should I watch for while using  this medicine?   Tell your doctor or health care professional if your symptoms do not start to get better or if they get worse.   Do not stop taking except on your doctor's advice. You may develop a severe reaction. Your doctor will tell you how much medicine to take.   You may get drowsy or dizzy. Do not drive, use machinery, or do anything that needs mental alertness until you know how this medicine affects you. To reduce the risk of dizzy and fainting spells, do not stand or sit up quickly, especially if you are an older patient. Alcohol may increase dizziness and drowsiness. Avoid alcoholic drinks.   If you are taking another medicine that also causes drowsiness, you may have more side effects. Give your health care provider a list of all medicines you use. Your doctor will tell you how much medicine to take. Do not take more medicine than directed. Call emergency for help if you have problems breathing or unusual sleepiness.          NOTE:This sheet is a summary. It may not cover all possible information. If you have questions about this medicine, talk to your doctor, pharmacist, or health care provider. Copyright 2017 [employer]               Oxycodone tablets or capsules   What is this medicine?   OXYCODONE (ox i KOE done) is a pain reliever. It is used to treat moderate to severe pain.   How should I use this medicine?   Take this medicine by mouth with a glass of water. Follow the directions on the prescription label. You can take it with or without food. If it upsets your stomach, take it with food. Take your medicine at regular intervals. Do not take it more often than directed. Do not stop taking except on your doctor's advice.   Some brands of this medicine, like Oxecta, have special instructions. Ask your doctor or pharmacist if these directions are for you: Do not cut, crush or chew this medicine. Swallow only one tablet at a time. Do not wet, soak, or lick the tablet before you take it.   A  special MedGuide will be given  to you by the pharmacist with each prescription and refill. Be sure to read this information carefully each time.   Talk to your pediatrician regarding the use of this medicine in children. Special care may be needed.   What side effects may I notice from receiving this medicine?   Side effects that you should report to your doctor or health care professional as soon as possible:      allergic reactions like skin rash, itching or hives, swelling of the face, lips, or tongue      breathing problems      confusion      signs and symptoms of low blood pressure like dizziness; feeling faint or lightheaded, falls; unusually weak or tired      trouble passing urine or change in the amount of urine      trouble swallowing     Side effects that usually do not require medical attention (report to your doctor or health care professional if they continue or are bothersome):      constipation      dry mouth      nausea, vomiting      tiredness    What may interact with this medicine?   This medicine may interact with the following medications:      alcohol      antihistamines for allergy, cough and cold      antiviral medicines for HIV or AIDS      atropine      certain antibiotics like clarithromycin, erythromycin, linezolid, rifampin      certain medicines for anxiety or sleep      certain medicines for bladder problems like oxybutynin, tolterodine      certain medicines for depression like amitriptyline, fluoxetine, sertraline      certain medicines for fungal infections like ketoconazole, itraconazole, voriconazole      certain medicines for migraine headache like almotriptan, eletriptan, frovatriptan, naratriptan, rizatriptan, sumatriptan, zolmitriptan      certain medicines for nausea or vomiting like dolasetron, ondansetron, palonosetron      certain medicines for Parkinson's disease like benztropine, trihexyphenidyl      certain medicines for seizures like  phenobarbital, phenytoin, primidone      certain medicines for stomach problems like dicyclomine, hyoscyamine      certain medicines for travel sickness like scopolamine      diuretics      general anesthetics like halothane, isoflurane, methoxyflurane, propofol      ipratropium      local anesthetics like lidocaine, pramoxine, tetracaine      MAOIs like Carbex, Eldepryl, Marplan, Nardil, and Parnate      medicines that relax muscles for surgery      methylene blue      nilotinib      other narcotic medicines for pain or cough      phenothiazines like chlorpromazine, mesoridazine, prochlorperazine, thioridazine    What if I miss a dose?   If you miss a dose, take it as soon as you can. If it is almost time for your next dose, take only that dose. Do not take double or extra doses.   Where should I keep my medicine?   Keep out of the reach of children. This medicine can be abused. Keep your medicine in a safe place to protect it from theft. Do not share this medicine with anyone. Selling or giving away this medicine is dangerous and against the law.   Store at room temperature between 15  and 30 degrees C (59 and 86 degrees F). Protect from light. Keep container tightly closed.   This medicine may cause accidental overdose and death if it is taken by other adults, children, or pets. Flush any unused medicine down the toilet to reduce the chance of harm. Do not use the medicine after the expiration date.   What should I tell my health care provider before I take this medicine?   They need to know if you have any of these conditions:      Addison's disease      brain tumor      head injury      heart disease      history of drug or alcohol abuse problem      if you often drink alcohol      kidney disease      liver disease      lung or breathing disease, like asthma      mental illness      pancreatic disease      seizures      thyroid disease      an unusual or allergic reaction to oxycodone,  codeine, hydrocodone, morphine, other medicines, foods, dyes, or preservatives      pregnant or trying to get pregnant      breast-feeding    What should I watch for while using this medicine?   Tell your doctor or health care professional if your pain does not go away, if it gets worse, or if you have new or a different type of pain. You may develop tolerance to the medicine. Tolerance means that you will need a higher dose of the medicine for pain relief. Tolerance is normal and is expected if you take this medicine for a long time.   Do not suddenly stop taking your medicine because you may develop a severe reaction. Your body becomes used to the medicine. This does NOT mean you are addicted. Addiction is a behavior related to getting and using a drug for a non-medical reason. If you have pain, you have a medical reason to take pain medicine. Your doctor will tell you how much medicine to take. If your doctor wants you to stop the medicine, the dose will be slowly lowered over time to avoid any side effects.   There are different types of narcotic medicines (opiates). If you take more than one type at the same time or if you are taking another medicine that also causes drowsiness, you may have more side effects. Give your health care provider a list of all medicines you use. Your doctor will tell you how much medicine to take. Do not take more medicine than directed. Call emergency for help if you have problems breathing or unusual sleepiness.   You may get drowsy or dizzy. Do not drive, use machinery, or do anything that needs mental alertness until you know how the medicine affects you. Do not stand or sit up quickly, especially if you are an older patient. This reduces the risk of dizzy or fainting spells. Alcohol may interfere with the effect of this medicine. Avoid alcoholic drinks.   This medicine will cause constipation. Try to have a bowel movement at least every 2 to 3 days. If you do not have a bowel  movement for 3 days, call your doctor or health care professional.   Your mouth may get dry. Chewing sugarless gum or sucking hard candy, and drinking plenty of water may help. Contact  your doctor if the problem does not go away or is severe.          NOTE:This sheet is a summary. It may not cover all possible information. If you have questions about this medicine, talk to your doctor, pharmacist, or health care provider. Copyright 2017 [employer]     ED/Trauma        Cast Care       Supporting your cast with a provided a sling or crutches can help with healing.     Your healthcare provider just gave you a cast made of plaster or fiberglass. This cast will hold your arm or leg in place to help it heal. Though it might feel a bit awkward at first, youll soon get used to it. During the coming days and weeks, the way you treat your cast can play a big part in how fast and how well you heal.   Keep the cast dry   If a plaster cast gets wet, it can soften and fall apart. And if the padding of a fiberglass cast gets wet, it can irritate and damage your skin. So your cast must stay dry.    Avoid activities that can get your cast wet. These include swimming, fishing, washing dishes, and even going out in the rain.    Bathe as directed by your healthcare provider. When you bathe, keep your cast out of water and wrapped in plastic.    Dont soak your cast in water, even if its wrapped in plastic.    If your cast does get wet, try drying it as soon as possible. To do this, use a hair dryer set to cool. Call your healthcare provider if your cast doesnt dry within 24 hours.   Handle with care   For the best results, remember the following:   Do    Do keep the cast clean and dry. Cover it with plastic to protect it when around dirt or water.    Do use any support you are given, such as crutches or a sling.    Do elevate the cast above your heart whenever possible.   Dont    Dont slide anything inside the cast, even to  scratch your skin.    Dont put lotions or powders around the cast or inside it.    Dont hit the cast.    Dont cut the cast or pull it apart.    Dont wash the cast.   When to call your healthcare provider   Call your healthcare provider right away if you have any of the following:    Swelling or cast tightness that does not improve with elevation    If your cast breaks    If your cast gets wet and cannot be dried    If you have increasing pain, numbness, or tingling      2000-2017 [employer] [address], [place], [place] [address]. All rights reserved. This information is not intended as a substitute for professional medical care. Always follow your healthcare professional's instructions.     Orthopaedics        Understanding Ankle Fracture Open Reduction and Internal Fixation   Open reduction and internal fixation (ORIF) is a type of treatment to fix a broken bone. It puts the pieces of a broken bone back together so they can heal. Open reduction means the bones are put back in place during a surgery. Internal fixation means that special hardware  is used to hold the bone pieces together. This helps the bone heals correctly. The procedure is done by an orthopedic surgeon. This is a doctor with special training in treating bone, joint, and muscle problems.   How an ankle fracture happens   Three bones make up the ankle joint. These are the shinbone (tibia), the smaller bone in your leg (fibula), and a bone in your foot (talus).   Different kinds of injury can damage the lower tibia, lower fibula, or talus. In some cases, only 1 of these bones might break. Or you may have a break in 2 or more of these bones. The bones may break, but the pieces are still lined up correctly. Or they may be broken and not lined up correctly.   Why ankle fracture ORIF is done   You are more likely to need ORIF if:    The bones of your leg are very out of alignment    One or more bones broke through the skin     Your bones broke into several pieces    Your ankle is unstable   How ankle fracture ORIF is done   During an open reduction, the bone pieces are put back in their proper alignment. The bones are then connected back in place with hardware. This is called internal fixation. The hardware may include screws, plates, rods, wires, or nails.   Risks of ankle fracture ORIF   All surgery has risks. The risks of ankle fracture ORIF include:    Infection    Bleeding    Nerve damage    Skin complications    Misaligned bone    Blood clots    Fat embolism    Irritation of area from the hardware    Problems from anesthesia    Need for additional surgery   Your risks vary based on your age and general health. For example, if you are a smoker or if you have low bone density, you may have a higher risk for certain problems. People with diabetes that is not controlled well may also have a higher risk for problems. Talk with your healthcare provider about which risks apply most to you.      2000-2017 [employer] [address] [place], [place] [address]. All rights reserved. This information is not intended as a substitute for professional medical care. Always follow your healthcare professional's instructions.

## 2016-07-13 NOTE — Care Plan (Signed)
 ED Anita Vazquez     Patient Education Materials Follows:  [employer]            Oxycodone tablets or capsules   What is this medicine?   OXYCODONE (ox i KOE done) is a pain reliever. It is used to treat moderate to severe pain.   How should I use this medicine?   Take this medicine by mouth with a glass of water. Follow the directions on the prescription label. You can take it with or without food. If it upsets your stomach, take it with food. Take your medicine at regular intervals. Do not take it more often than directed. Do not stop taking except on your doctor's advice.   Some brands of this medicine, like Oxecta, have special instructions. Ask your doctor or pharmacist if these directions are for you: Do not cut, crush or chew this medicine. Swallow only one tablet at a time. Do not wet, soak, or lick the tablet before you take it.   A special MedGuide will be given to you by the pharmacist with each prescription and refill. Be sure to read this information carefully each time.   Talk to your pediatrician regarding the use of this medicine in children. Special care may be needed.   What side effects may I notice from receiving this medicine?   Side effects that you should report to your doctor or health care professional as soon as possible:      allergic reactions like skin rash, itching or hives, swelling of the face, lips, or tongue      breathing problems      confusion      signs and symptoms of low blood pressure like dizziness; feeling faint or lightheaded, falls; unusually weak or tired      trouble passing urine or change in the amount of urine      trouble swallowing     Side effects that usually do not require medical attention (report to your doctor or health care professional if they continue or are bothersome):      constipation      dry mouth      nausea, vomiting      tiredness    What may interact with this medicine?   This medicine may interact with the following medications:       alcohol      antihistamines for allergy, cough and cold      antiviral medicines for HIV or AIDS      atropine      certain antibiotics like clarithromycin, erythromycin, linezolid, rifampin      certain medicines for anxiety or sleep      certain medicines for bladder problems like oxybutynin, tolterodine      certain medicines for depression like amitriptyline, fluoxetine, sertraline      certain medicines for fungal infections like ketoconazole, itraconazole, voriconazole      certain medicines for migraine headache like almotriptan, eletriptan, frovatriptan, naratriptan, rizatriptan, sumatriptan, zolmitriptan      certain medicines for nausea or vomiting like dolasetron, ondansetron, palonosetron      certain medicines for Parkinson's disease like benztropine, trihexyphenidyl      certain medicines for seizures like phenobarbital, phenytoin, primidone      certain medicines for stomach problems like dicyclomine, hyoscyamine      certain medicines for travel sickness like scopolamine      diuretics      general anesthetics like halothane, isoflurane, methoxyflurane, propofol  ipratropium      local anesthetics like lidocaine, pramoxine, tetracaine      MAOIs like Carbex, Eldepryl, Marplan, Nardil, and Parnate      medicines that relax muscles for surgery      methylene blue      nilotinib      other narcotic medicines for pain or cough      phenothiazines like chlorpromazine, mesoridazine, prochlorperazine, thioridazine    What if I miss a dose?   If you miss a dose, take it as soon as you can. If it is almost time for your next dose, take only that dose. Do not take double or extra doses.   Where should I keep my medicine?   Keep out of the reach of children. This medicine can be abused. Keep your medicine in a safe place to protect it from theft. Do not share this medicine with anyone. Selling or giving away this medicine is dangerous and against the law.   Store at room  temperature between 15 and 30 degrees C (59 and 86 degrees F). Protect from light. Keep container tightly closed.   This medicine may cause accidental overdose and death if it is taken by other adults, children, or pets. Flush any unused medicine down the toilet to reduce the chance of harm. Do not use the medicine after the expiration date.   What should I tell my health care provider before I take this medicine?   They need to know if you have any of these conditions:      Addison's disease      brain tumor      head injury      heart disease      history of drug or alcohol abuse problem      if you often drink alcohol      kidney disease      liver disease      lung or breathing disease, like asthma      mental illness      pancreatic disease      seizures      thyroid disease      an unusual or allergic reaction to oxycodone, codeine, hydrocodone, morphine, other medicines, foods, dyes, or preservatives      pregnant or trying to get pregnant      breast-feeding    What should I watch for while using this medicine?   Tell your doctor or health care professional if your pain does not go away, if it gets worse, or if you have new or a different type of pain. You may develop tolerance to the medicine. Tolerance means that you will need a higher dose of the medicine for pain relief. Tolerance is normal and is expected if you take this medicine for a long time.   Do not suddenly stop taking your medicine because you may develop a severe reaction. Your body becomes used to the medicine. This does NOT mean you are addicted. Addiction is a behavior related to getting and using a drug for a non-medical reason. If you have pain, you have a medical reason to take pain medicine. Your doctor will tell you how much medicine to take. If your doctor wants you to stop the medicine, the dose will be slowly lowered over time to avoid any side effects.   There are different types of narcotic medicines (opiates). If  you take more than one type at the same time or if you are taking  another medicine that also causes drowsiness, you may have more side effects. Give your health care provider a list of all medicines you use. Your doctor will tell you how much medicine to take. Do not take more medicine than directed. Call emergency for help if you have problems breathing or unusual sleepiness.   You may get drowsy or dizzy. Do not drive, use machinery, or do anything that needs mental alertness until you know how the medicine affects you. Do not stand or sit up quickly, especially if you are an older patient. This reduces the risk of dizzy or fainting spells. Alcohol may interfere with the effect of this medicine. Avoid alcoholic drinks.   This medicine will cause constipation. Try to have a bowel movement at least every 2 to 3 days. If you do not have a bowel movement for 3 days, call your doctor or health care professional.   Your mouth may get dry. Chewing sugarless gum or sucking hard candy, and drinking plenty of water may help. Contact your doctor if the problem does not go away or is severe.          NOTE:This sheet is a summary. It may not cover all possible information. If you have questions about this medicine, talk to your doctor, pharmacist, or health care provider. Copyright 2017 [employer]               Lorazepam tablets   What is this medicine?   LORAZEPAM (lor A ze pam) is a benzodiazepine. It is used to treat anxiety.   How should I use this medicine?   Take this medicine by mouth with a glass of water. Follow the directions on the prescription label. Take your medicine at regular intervals. Do not take it more often than directed. Do not stop taking except on your doctor's advice.   A special MedGuide will be given to you by the pharmacist with each prescription and refill. Be sure to read this information carefully each time.   Talk to your pediatrician regarding the use of this medicine in children. While  this drug may be used in children as young as 12 years for selected conditions, precautions do apply.   What side effects may I notice from receiving this medicine?   Side effects that you should report to your doctor or health care professional as soon as possible:      allergic reactions like skin rash, itching or hives, swelling of the face, lips, or tongue      breathing problems      confusion      loss of balance or coordination      signs and symptoms of low blood pressure like dizziness; feeling faint or lightheaded, falls; unusually weak or tired      suicidal thoughts or other mood changes     Side effects that usually do not require medical attention (report to your doctor or health care professional if they continue or are bothersome):      dizziness      headache      nausea, vomiting      tiredness    What may interact with this medicine?   Do not take this medicine with any of the following medications:      narcotic medicines for cough      sodium oxybate     This medicine may also interact with the following medications:      alcohol  antihistamines for allergy, cough and cold      certain medicines for anxiety or sleep      certain medicines for depression, like amitriptyline, fluoxetine, sertraline      certain medicines for seizures like carbamazepine, phenobarbital, phenytoin, primidone      general anesthetics like lidocaine, pramoxine, tetracaine      MAOIs like Carbex, Eldepryl, Marplan, Nardil, and Parnate      medicines that relax muscles for surgery      narcotic medicines for pain      phenothiazines like chlorpromazine, mesoridazine, prochlorperazine, thioridazine    What if I miss a dose?   If you miss a dose, take it as soon as you can. If it is almost time for your next dose, take only that dose. Do not take double or extra doses.   Where should I keep my medicine?   Keep out of the reach of children. This medicine can be abused. Keep your medicine in a safe  place to protect it from theft. Do not share this medicine with anyone. Selling or giving away this medicine is dangerous and against the law.   This medicine may cause accidental overdose and death if taken by other adults, children, or pets. Mix any unused medicine with a substance like cat litter or coffee grounds. Then throw the medicine away in a sealed container like a sealed bag or a coffee can with a lid. Do not use the medicine after the expiration date.   Store at room temperature between 20 and 25 degrees C (68 and 77 degrees F). Protect from light. Keep container tightly closed.   What should I tell my health care provider before I take this medicine?   They need to know if you have any of these conditions:      glaucoma      history of drug or alcohol abuse problem      kidney disease      liver disease      lung or breathing disease, like asthma      mental illness      myasthenia gravis      Parkinson's disease      suicidal thoughts, plans, or attempt; a previous suicide attempt by you or a family member      an unusual or allergic reaction to lorazepam, other medicines, foods, dyes, or preservatives      pregnant or trying to get pregnant      breast-feeding    What should I watch for while using this medicine?   Tell your doctor or health care professional if your symptoms do not start to get better or if they get worse.   Do not stop taking except on your doctor's advice. You may develop a severe reaction. Your doctor will tell you how much medicine to take.   You may get drowsy or dizzy. Do not drive, use machinery, or do anything that needs mental alertness until you know how this medicine affects you. To reduce the risk of dizzy and fainting spells, do not stand or sit up quickly, especially if you are an older patient. Alcohol may increase dizziness and drowsiness. Avoid alcoholic drinks.   If you are taking another medicine that also causes drowsiness, you may have more side  effects. Give your health care provider a list of all medicines you use. Your doctor will tell you how much medicine to take. Do not take more medicine than directed. Call emergency for  help if you have problems breathing or unusual sleepiness.          NOTE:This sheet is a summary. It may not cover all possible information. If you have questions about this medicine, talk to your doctor, pharmacist, or health care provider. Copyright 2017 [employer]     ED/Trauma        Cast Care       Supporting your cast with a provided a sling or crutches can help with healing.     Your healthcare provider just gave you a cast made of plaster or fiberglass. This cast will hold your arm or leg in place to help it heal. Though it might feel a bit awkward at first, youll soon get used to it. During the coming days and weeks, the way you treat your cast can play a big part in how fast and how well you heal.   Keep the cast dry   If a plaster cast gets wet, it can soften and fall apart. And if the padding of a fiberglass cast gets wet, it can irritate and damage your skin. So your cast must stay dry.    Avoid activities that can get your cast wet. These include swimming, fishing, washing dishes, and even going out in the rain.    Bathe as directed by your healthcare provider. When you bathe, keep your cast out of water and wrapped in plastic.    Dont soak your cast in water, even if its wrapped in plastic.    If your cast does get wet, try drying it as soon as possible. To do this, use a hair dryer set to cool. Call your healthcare provider if your cast doesnt dry within 24 hours.   Handle with care   For the best results, remember the following:   Do    Do keep the cast clean and dry. Cover it with plastic to protect it when around dirt or water.    Do use any support you are given, such as crutches or a sling.    Do elevate the cast above your heart whenever possible.   Dont    Dont slide anything inside the cast, even  to scratch your skin.    Dont put lotions or powders around the cast or inside it.    Dont hit the cast.    Dont cut the cast or pull it apart.    Dont wash the cast.   When to call your healthcare provider   Call your healthcare provider right away if you have any of the following:    Swelling or cast tightness that does not improve with elevation    If your cast breaks    If your cast gets wet and cannot be dried    If you have increasing pain, numbness, or tingling      2000-2017 [employer] [address], [place], [place] [address]. All rights reserved. This information is not intended as a substitute for professional medical care. Always follow your healthcare professional's instructions.     Orthopaedics        Understanding Ankle Fracture Open Reduction and Internal Fixation   Open reduction and internal fixation (ORIF) is a type of treatment to fix a broken bone. It puts the pieces of a broken bone back together so they can heal. Open reduction means the bones are put back in place during a surgery. Internal fixation means that special hardware is used to  hold the bone pieces together. This helps the bone heals correctly. The procedure is done by an orthopedic surgeon. This is a doctor with special training in treating bone, joint, and muscle problems.   How an ankle fracture happens   Three bones make up the ankle joint. These are the shinbone (tibia), the smaller bone in your leg (fibula), and a bone in your foot (talus).   Different kinds of injury can damage the lower tibia, lower fibula, or talus. In some cases, only 1 of these bones might break. Or you may have a break in 2 or more of these bones. The bones may break, but the pieces are still lined up correctly. Or they may be broken and not lined up correctly.   Why ankle fracture ORIF is done   You are more likely to need ORIF if:    The bones of your leg are very out of alignment    One or more bones broke through the  skin    Your bones broke into several pieces    Your ankle is unstable   How ankle fracture ORIF is done   During an open reduction, the bone pieces are put back in their proper alignment. The bones are then connected back in place with hardware. This is called internal fixation. The hardware may include screws, plates, rods, wires, or nails.   Risks of ankle fracture ORIF   All surgery has risks. The risks of ankle fracture ORIF include:    Infection    Bleeding    Nerve damage    Skin complications    Misaligned bone    Blood clots    Fat embolism    Irritation of area from the hardware    Problems from anesthesia    Need for additional surgery   Your risks vary based on your age and general health. For example, if you are a smoker or if you have low bone density, you may have a higher risk for certain problems. People with diabetes that is not controlled well may also have a higher risk for problems. Talk with your healthcare provider about which risks apply most to you.      2000-2017 [employer] [address] [place], [place] [address]. All rights reserved. This information is not intended as a substitute for professional medical care. Always follow your healthcare professional's instructions.

## 2016-07-13 NOTE — Nursing Note (Signed)
Nursing Discharge Summary - Text       Nursing Discharge Summary Entered On:  07/13/2016 16:35 EDT    Performed On:  07/13/2016 16:31 EDT by Farrel DemarkMitchum, RN, Elvin Sohristina L               DC Information   Discharge To, Anticipated :   Home independently   Devices/Equipment :   Wheelchair - Manual, Other: knee walker   Mode of Discharge :   Wheelchair   Transportation :   Private vehicle   Accompanied By :   Daughter   Mitchum, RN, Elvin SoChristina L - 07/13/2016 16:31 EDT   Education   Responsible Learner(s) :   Living Situation: Home independently        Performed by: Burnadette PeterLYNCH, PT, ADDIE G - 07/12/2016 11:00  Discharge To: Home independently        Performed by: Duffy RhodyERRICK,  KATHRYN T - 07/13/2016 09:54     Home Caregiver Present for Session :   Yes   Barriers To Learning :   None evident   Teaching Method :   Explanation, Printed materials   Mitchum, RN, Elvin SoChristina L - 07/13/2016 16:31 EDT   Post-Hospital Education Adult Grid   Activity Expectations :   Trenton GammonVerbalizes understanding   Equipment/Devices :   Verbalizes understanding   Importance of Follow-Up Visits :   Verbalizes understanding   Pain Management :   Verbalizes understanding   Physical Limitations :   Verbalizes understanding   Plan of Care :   Verbalizes understanding   When to Call Health Care Provider :   [facility]   Mitchum, RN, Elvin SoChristina L - 07/13/2016 16:31 EDT   Health Maintenance Education Adult Grid   Allergies :   Verbalizes understanding   Exercise :   Verbalizes understanding   Mitchum, RN, Elvin SoChristina L - 07/13/2016 16:31 EDT   Medication Education Adult Grid   Drug to Drug Interactions :   Verbalizes understanding   Med Dosage, Route, Scheduling :   TEFL teacherVerbalizes understanding   Med Teacher, musicpecial Administration, Storage :   IT sales professionalVerbalizes understanding   Safety, Medication :   Verbalizes understanding   Mitchum, RN, Elvin SoChristina L - 07/13/2016 16:31 EDT   Safety Education Adult Grid   Safety, Fall :   Verbalizes understanding   Mitchum, RN, Elvin SoChristina L - 07/13/2016 16:31 EDT    Additional Learner(s) Present :   Daughter   Time Spent Educating Patient :   25 minutes   Mitchum, RN, Elvin SoChristina L - 07/13/2016 16:31 EDT

## 2016-07-13 NOTE — Case Communication (Signed)
Care Management Discharge Plan - Text       Care Management Discharge Plan Entered On:  07/14/2016 10:16 EDT    Performed On:  07/14/2016 10:15 EDT by Duffy RhodyERRICK,  KATHRYN T               Discharge IRF-PAI   Discharge to Living Setting :   Home - private home/apartment   Discharge to Living With :   Alone   IRF-PAI Program Interruptions :   No   Duffy RhodyDERRICK,  KATHRYN T - 07/14/2016 10:15 EDT

## 2016-07-13 NOTE — Case Communication (Signed)
 CM Discharge Planning Assessment - Text       CM Discharge Planning Ongoing Assessment Entered On:  07/13/2016 9:54 EDT    Performed On:  07/13/2016 9:54 EDT by DERRICK,  KATHRYN T               Discharge Needs I   Previously Documented Discharge Needs :   DISCHARGE PLAN/NEEDS:  Anticipated Discharge Date: 07/13/2016 - DERRICK,  KATHRYN T - 07/13/16 09:10:00  Discharge To, Anticipated: Home independently - DERRICK,  KATHRYN T - 07/13/16 09:10:00  Needs Assistance with Transportation: Yes GLENWOOD HOMER,  KATHRYN T - 07/13/16 09:10:00  EQUIPMENT/TREATMENT NEEDS:  Needs Assistance at Home Upon Discharge: No - DERRICK,  KATHRYN T - 07/13/16 09:10:00       Previously Documented Benefits Information :   Performed By: [name]  - 07/03/16 10:39:00       Anticipated Discharge Date :   07/13/2016 EDT   Anticipated Discharge Time Slot :   1000-1200   Discharge To :   Home independently   CM Progress Note :   Pt is a 60 year old female.  Pt recently accepted a new job in [place]. that she needs to be mobile (able to carry over 30lbs) and her new apartment is on the 3rd floor.  Pt is upset over her current financial concerns and being homeless by this weekend.  TC will be held on 07/06/2016.    07/06/2016  SW met with Pt to present ITC-Summary.  Pt verbalized being upset about being homeless as of this weekend and how she will not be able to afford DME.  Pt just accepted a new job in [place]. & she is expected to start work on 07/14/2016.  Pt was upset when presented with anticipated D/C date, as evidenced by her tearsfulness.  SW spoke with her about exploring asking family for assistance, but she does not want to bother or ask for money.  SW made other suggestions that Pt discounted.  SW will continue to assist.  Insurance updated on 07/10/2016.  vcd     DERRICK,  KATHRYN T - 07/13/2016 9:54 EDT   Discharge Needs II   Home Equipment Rehab :   Wheelchair - Manual   Needs Assistance with Transportation :   Yes   Needs Assistance at Home  Upon Discharge :   No   Discharge Planning Time Spent :   15 minutes   DERRICK,  KATHRYN T - 07/13/2016 9:54 EDT   Discharge Planning   Discharge Arrangements :       Patient Post-Acute Information    Patient Name: Anita Vazquez, Anita Vazquez  MRN: [id number]  FIN: [id number]  Gender: Female  DOB: 2056-07-23  Age:  36 Years        *** No Post-Acute Placement(s) Listed ***          *** No Post-Acute Service(s) Listed Nurse, mental health Hospital Services :   PATIENT MOVING TO [place] TO START A NEW JOB    PATIENT WILL OBTAIN A WHEELCHAIR FROM VENDOR IN [place]:  [facility] will be reaching out to pt via cell # to let her know when wheelchair will be available in [place] (ph [phone] [address] [place])  Pt also has wheelchair RX in hand if needed.     Discharge Options Discussed with Patient :   DME   Discharge Plan Discussion :   Discussed with patient,  Patient agrees with plan   DERRICK,  KATHRYN T - 07/13/2016 9:54 EDT

## 2016-07-13 NOTE — Nursing Note (Signed)
Medication Administration Follow Up-Text       Medication Administration Follow Up Entered On:  07/13/2016 19:31 EDT    Performed On:  07/13/2016 5:52 EDT by Azucena Kuba, RN, VANESSA      Intervention Information:     oxycodone  Performed by Azucena Kuba, RN, VANESSA on 07/13/2016 04:52:00 EDT       oxycodone,20mg   Oral,moderate pain (4-7)       Medication Effectiveness Evaluation   Medication Administration Reason :   Pain   Medication Effective :   Yes   Medication Response :   Symptoms improved, Continue to observe for symptoms   Azucena Kuba RN, [place] Noe - 07/13/2016 19:31 EDT

## 2016-10-26 ENCOUNTER — Encounter (INDEPENDENT_AMBULATORY_CARE_PROVIDER_SITE_OTHER): Payer: Self-pay | Admitting: Orthopedic Surgery

## 2016-10-26 ENCOUNTER — Ambulatory Visit (INDEPENDENT_AMBULATORY_CARE_PROVIDER_SITE_OTHER): Payer: 59 | Admitting: Orthopedic Surgery

## 2016-10-26 VITALS — Ht 63.0 in | Wt 160.0 lb

## 2016-10-26 DIAGNOSIS — M7751 Other enthesopathy of right foot: Secondary | ICD-10-CM | POA: Diagnosis not present

## 2016-10-26 DIAGNOSIS — M25871 Other specified joint disorders, right ankle and foot: Secondary | ICD-10-CM | POA: Insufficient documentation

## 2016-10-26 MED ORDER — LIDOCAINE HCL 1 % IJ SOLN
2.0000 mL | INTRAMUSCULAR | Status: AC | PRN
  Administered 2016-10-26: 2 mL

## 2016-10-26 MED ORDER — METHYLPREDNISOLONE ACETATE 40 MG/ML IJ SUSP
40.0000 mg | INTRAMUSCULAR | Status: AC | PRN
  Administered 2016-10-26: 40 mg via INTRA_ARTICULAR

## 2016-10-26 NOTE — Progress Notes (Signed)
Office Visit Note   Patient: Rachel Cole           Date of Birth: 16-Jul-1956           MRN: [id number] Visit Date: 10/26/2016              Requested by: August AlbinoWalter H Wray, MD [address] [place], [place] [id number] PCP: August AlbinoWray, Walter H, MD   Assessment & Plan: Visit Diagnoses:  1. Impingement syndrome of right ankle     Plan: Patient's right ankle was injected from the anterior medial portal. Will follow up in 4 weeks. If she is still symptomatic could consider arthroscopic debridement for the impingement. She is asymptomatic with the retained hardware and is not symptomatic for possible delayed union.  Follow-Up Instructions: Return in about 4 weeks (around 11/23/2016).   Orders:  No orders of the defined types were placed in this encounter.  No orders of the defined types were placed in this encounter.     Procedures: Medium Joint Inj Date/Time: 10/26/2016 11:46 AM Performed by: DUDA, MARCUS V Authorized by: Nadara MustardUDA, MARCUS V   Consent Given by:  Patient Site marked: the procedure site was marked   Timeout: prior to procedure the correct patient, procedure, and site was verified   Indications:  Pain and diagnostic evaluation Location:  Ankle Site:  R ankle Prep: patient was prepped and draped in usual sterile fashion   Needle Size:  22 G Needle Length:  1.5 inches Approach:  Anteromedial Ultrasound Guided: No   Fluoroscopic Guidance: No   Medications:  2 mL lidocaine 1 %; 40 mg methylPREDNISolone acetate 40 MG/ML Aspiration Attempted: No   Patient tolerance:  Patient tolerated the procedure well with no immediate complications     Clinical Data: No additional findings.   Subjective: Chief Complaint  Patient presents with  . Right Ankle - Fracture    S/P ORIF RT ANKLE 07/01/16     Patient is here for second opinion of right ankle open reduction internal fixation 07/01/16 [place] She was treated in a cast for several weeks. She states  she was told by Dr. 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 She is currently in ASO. She is taking advil or tylenol and tramadol on prn basis for her pain.   Patient is approximately 4 months status post open reduction internal fixation bimalleolar right ankle fracture. She has no pain medially or laterally over the bone complains of pain anteriorly over the ankle joint. Review of Systems   Objective: Vital Signs: Ht 5\' 3"  (1.6 m)   Wt 160 lb (72.6 kg)   BMI 28.34 kg/m   Physical Exam examination she is alert oriented no adenopathy well-dressed normal affect normal [place] she does have a slight antalgic gait with using an ASO. She does have a good dorsalis pedis and posterior tibial pulse. She has good dorsiflexion of the ankle with no limitations. She has no tenderness to palpation over the medial or lateral malleolus. She is point tender to palpation anteriorly over the ankle joint. Her radiographs were reviewed which shows some joint space narrowing over the lateral joint line.  Ortho Exam  Specialty Comments:  No specialty comments available.  Imaging: No results found.   PMFS History: Patient Active Problem List   Diagnosis Date Noted  . Impingement syndrome of right ankle 10/26/2016   History reviewed. No pertinent past medical history.  History reviewed. No pertinent family history.  Past Surgical  History:  Procedure Laterality Date  . ANKLE FRACTURE SURGERY Right 07/01/2016   Social History   Occupational History  . Not on file.   Social History Main Topics  . Smoking status: Never Smoker  . Smokeless tobacco: Never Used  . Alcohol use Not on file  . Drug use: Unknown  . Sexual activity: Not on file

## 2016-11-25 ENCOUNTER — Encounter (HOSPITAL_COMMUNITY): Payer: Self-pay | Admitting: *Deleted

## 2016-11-25 ENCOUNTER — Emergency Department (HOSPITAL_COMMUNITY): Payer: 59

## 2016-11-25 ENCOUNTER — Emergency Department (HOSPITAL_COMMUNITY)
Admission: EM | Admit: 2016-11-25 | Discharge: 2016-11-25 | Disposition: A | Discharge status: Home / Self Care | Payer: 59 | Attending: Emergency Medicine | Admitting: Emergency Medicine

## 2016-11-25 DIAGNOSIS — Y929 Unspecified place or not applicable: Secondary | ICD-10-CM | POA: Diagnosis not present

## 2016-11-25 DIAGNOSIS — M25571 Pain in right ankle and joints of right foot: Secondary | ICD-10-CM

## 2016-11-25 DIAGNOSIS — Y999 Unspecified external cause status: Secondary | ICD-10-CM | POA: Insufficient documentation

## 2016-11-25 DIAGNOSIS — Z79899 Other long term (current) drug therapy: Secondary | ICD-10-CM | POA: Insufficient documentation

## 2016-11-25 DIAGNOSIS — W19XXXA Unspecified fall, initial encounter: Secondary | ICD-10-CM

## 2016-11-25 DIAGNOSIS — Y9329 Activity, other involving ice and snow: Secondary | ICD-10-CM | POA: Diagnosis not present

## 2016-11-25 DIAGNOSIS — W009XXA Unspecified fall due to ice and snow, initial encounter: Secondary | ICD-10-CM | POA: Insufficient documentation

## 2016-11-25 LAB — I-STAT CHEM 8, ED
BUN: 12 mg/dL (ref 6–20)
Calcium, Ion: 1.23 mmol/L (ref 1.15–1.40)
Chloride: 111 mmol/L (ref 101–111)
Creatinine, Ser: 0.7 mg/dL (ref 0.44–1.00)
GLUCOSE: 116 mg/dL — AB (ref 65–99)
HEMATOCRIT: 40 % (ref 36.0–46.0)
HEMOGLOBIN: 13.6 g/dL (ref 12.0–15.0)
POTASSIUM: 4.1 mmol/L (ref 3.5–5.1)
Sodium: 143 mmol/L (ref 135–145)
TCO2: 21 mmol/L (ref 0–100)

## 2016-11-25 NOTE — ED Provider Notes (Signed)
[facility]PT Provider Note   CSN: [id number] Arrival date & time: 11/25/16  1735   By signing my name below, I, Freida Busmaniana Omoyeni, attest that this documentation has been prepared under the direction and in the presence of Gerhard Munchobert Ritamarie Arkin, MD . Electronically Signed: Freida Busmaniana Omoyeni, Scribe. 11/25/2016. 7:03 PM.  History   Chief Complaint Chief Complaint  Patient presents with  . Ankle Pain    The history is provided by the patient. No language interpreter was used.     HPI Comments:  Rachel Cole is a 61 y.o. female who presents to the Emergency Department complaining of moderate, right ankle pain s/p fall on the ice today. No LOC or head injury. She is ambulating on the extremity with the help of a cane. Pt has a h/o ORIF to the same ankle 5 months ago; states she has not completely healed.   At this time she is also complaining of melena x ~ 5 months. It does not seem as though she has active complaints of ongoing bleed, but greater concern of intermittent episodes over the past months.  She notes her last hemoglobin was low. Pt is s/p gastric bypass and notes her hemoglobin is often low.   Rozanna Boxrtho Dutta and Thurston HoleWainer.  [facility] [place] [facility]   History reviewed. No pertinent past medical history.  Patient Active Problem List   Diagnosis Date Noted  . Impingement syndrome of right ankle 10/26/2016    Past Surgical History:  Procedure Laterality Date  . ANKLE FRACTURE SURGERY Right 07/01/2016    OB History    No data available       Home Medications    Prior to Admission medications   Medication Sig Start Date End Date Taking? Authorizing Provider  omeprazole (PRILOSEC) 20 MG capsule Take 1 capsule (20 mg total) by mouth daily. Patient not taking: Reported on 11/25/2016 07/31/13   Gerhard Munchobert Menachem Urbanek, MD  ondansetron (ZOFRAN) 4 MG tablet Take 1 tablet (4 mg total) by mouth every 8 (eight) hours as needed for nausea. Patient not taking: Reported on 11/25/2016 07/31/13    Gerhard Munchobert Adonay Scheier, MD  sucralfate (CARAFATE) 1 G tablet Take 1 tablet (1 g total) by mouth 4 (four) times daily. Patient not taking: Reported on 11/25/2016 07/31/13 11/25/16  Gerhard Munchobert Ellarie Picking, MD    Family History No family history on file.  Social History Social History  Substance Use Topics  . Smoking status: Never Smoker  . Smokeless tobacco: Never Used  . Alcohol use Yes     Allergies   Bee venom and Penicillins   Review of Systems Review of Systems  Constitutional:       Per HPI, otherwise negative  HENT:       Per HPI, otherwise negative  Respiratory:       Per HPI, otherwise negative  Cardiovascular:       Per HPI, otherwise negative  Gastrointestinal: Negative for vomiting.  Endocrine:       Negative aside from HPI  Genitourinary:       Neg aside from HPI   Musculoskeletal:       Per HPI, otherwise negative  Skin: Negative.  Negative for wound.  Neurological: Negative for syncope.     Physical Exam Updated Vital Signs BP 124/79 (BP Location: Left Arm)   Pulse 85   Temp 97.6 F (36.4 C) (Oral)   Resp 17   Ht 5\' 3"  (1.6 m)   Wt 160 lb (72.6 kg)   SpO2 98%  BMI 28.34 kg/m   Physical Exam  Constitutional: She is oriented to person, place, and time. She appears well-developed and well-nourished. No distress.  HENT:  Head: Normocephalic and atraumatic.  Eyes: Conjunctivae and EOM are normal.  Cardiovascular: Normal rate and regular rhythm.   Pulmonary/Chest: Effort normal and breath sounds normal. No stridor. No respiratory distress.  Abdominal: She exhibits no distension.  Musculoskeletal:  Pain about the dorsum of the foot on the lateral aspect but no deformity  Achilles tendon function intact   Neurological: She is alert and oriented to person, place, and time. No cranial nerve deficit.  Skin: Skin is warm and dry.  Psychiatric:  Slightly repetitive, but oriented 3, pleasant, appropriately interactive.  Nursing note and vitals  reviewed.    ED Treatments / Results  DIAGNOSTIC STUDIES:  Oxygen Saturation is 98% on RA, normal by my interpretation.    COORDINATION OF CARE:  6:58 PM Discussed treatment plan with pt at bedside and pt agreed to plan.  Radiology Dg Ankle Complete Right  Result Date: 11/25/2016 CLINICAL DATA:  Larey Seat on ice, on RIGHT ankle pain. Surgery 2 months ago. EXAM: RIGHT ANKLE - COMPLETE 3+ VIEW COMPARISON:  None. FINDINGS: No acute fracture deformity dislocation. Intact well-seated distal fibular plate and screw fixation without periprosthetic lucency or residual fracture line. Two fully threaded intact screws within medial malleolus, no hardware failure or residual fracture line. The ankle mortise appears congruent and the tibiofibular syndesmosis intact. Osteopenia. Small plantar calcaneal spur. No destructive bony lesions. Soft tissue planes are non-suspicious. IMPRESSION: No acute fracture deformity or dislocation. Osteopenia decreases sensitivity for acute non displaced fractures. Ankle ORIF, no residual fracture line and no radiographic findings of hardware failure. Electronically Signed   By: Awilda Metro M.D.   On: 11/25/2016 18:19    Procedures Procedures (including critical care time)  Labs reassuring with hemoglobin greater than 13  Initial Impression / Assessment and Plan / ED Course  I have reviewed the triage vital signs and the nursing notes.  Pertinent labs & imaging results that were available during my care of the patient were reviewed by me and considered in my medical decision making (see chart for details).  Clinical Course     Patient presents with concern of ankle pain following a fall. Patient is distally neurovascular intact, has minimal tenderness to palpation about the dorsum of the foot, distal ankle. No evidence for fracture, though the patient's recent surgical procedure is noted. Patient has orthopedist with whom she'll follow-up. Patient has a ASO  brace, and had this placed. Patient also had secondary concern of hemoglobin, but this was found to be elevated compared to recent values, and without complaint of current bleeding, and with no hemodynamic instability, patient is appropriate for discharge with outpatient follow-up as well.   Final Clinical Impressions(s) / ED Diagnoses   I personally performed the services described in this documentation, which was scribed in my presence. The recorded information has been reviewed and is accurate.   Fall Ankle pain    Gerhard Munch, MD 11/25/16 [phone]

## 2016-11-25 NOTE — Discharge Instructions (Signed)
As discussed, your evaluation today has been largely reassuring.  But, it is important that you monitor your condition carefully, and do not hesitate to return to the ED if you develop new, or concerning changes in your condition. ? ?Otherwise, please follow-up with your physician for appropriate ongoing care. ? ?

## 2016-11-25 NOTE — ED Triage Notes (Signed)
The pt fell on the ice  She is c/o pain in the rt ankle  She fell today

## 2016-11-25 NOTE — ED Triage Notes (Signed)
The pt is partially deaf

## 2016-11-26 ENCOUNTER — Ambulatory Visit (INDEPENDENT_AMBULATORY_CARE_PROVIDER_SITE_OTHER): Payer: Self-pay | Admitting: Orthopedic Surgery

## 2016-12-09 ENCOUNTER — Ambulatory Visit (INDEPENDENT_AMBULATORY_CARE_PROVIDER_SITE_OTHER): Payer: Self-pay | Admitting: Orthopedic Surgery

## 2016-12-21 ENCOUNTER — Emergency Department (HOSPITAL_COMMUNITY)
Admission: EM | Admit: 2016-12-21 | Discharge: 2016-12-21 | Disposition: A | Discharge status: Home / Self Care | Payer: 59 | Attending: Emergency Medicine | Admitting: Emergency Medicine

## 2016-12-21 ENCOUNTER — Emergency Department (HOSPITAL_COMMUNITY): Payer: 59

## 2016-12-21 ENCOUNTER — Encounter (HOSPITAL_COMMUNITY): Payer: Self-pay | Admitting: Emergency Medicine

## 2016-12-21 DIAGNOSIS — Z79899 Other long term (current) drug therapy: Secondary | ICD-10-CM | POA: Insufficient documentation

## 2016-12-21 DIAGNOSIS — J09X2 Influenza due to identified novel influenza A virus with other respiratory manifestations: Secondary | ICD-10-CM | POA: Diagnosis not present

## 2016-12-21 DIAGNOSIS — J101 Influenza due to other identified influenza virus with other respiratory manifestations: Secondary | ICD-10-CM

## 2016-12-21 DIAGNOSIS — R05 Cough: Secondary | ICD-10-CM | POA: Diagnosis present

## 2016-12-21 LAB — COMPREHENSIVE METABOLIC PANEL
ALK PHOS: 92 U/L (ref 38–126)
ALT: 24 U/L (ref 14–54)
ANION GAP: 11 (ref 5–15)
AST: 25 U/L (ref 15–41)
Albumin: 3.4 g/dL — ABNORMAL LOW (ref 3.5–5.0)
BUN: 9 mg/dL (ref 6–20)
CALCIUM: 8.8 mg/dL — AB (ref 8.9–10.3)
CO2: 21 mmol/L — AB (ref 22–32)
Chloride: 107 mmol/L (ref 101–111)
Creatinine, Ser: 0.73 mg/dL (ref 0.44–1.00)
GFR calc non Af Amer: >60 mL/min (ref >60)
Glucose, Bld: 101 mg/dL — ABNORMAL HIGH (ref 65–99)
Potassium: 3.9 mmol/L (ref 3.5–5.1)
Sodium: 139 mmol/L (ref 135–145)
Total Bilirubin: 0.5 mg/dL (ref 0.3–1.2)
Total Protein: 6.8 g/dL (ref 6.5–8.1)

## 2016-12-21 LAB — CBC
HCT: 39.2 % (ref 36.0–46.0)
Hemoglobin: 12.4 g/dL (ref 12.0–15.0)
MCH: 28.4 pg (ref 26.0–34.0)
MCHC: 31.6 g/dL (ref 30.0–36.0)
MCV: 89.7 fL (ref 78.0–100.0)
PLATELETS: 265 10*3/uL (ref 150–400)
RBC: 4.37 MIL/uL (ref 3.87–5.11)
RDW: 14.7 % (ref 11.5–15.5)
WBC: 5.3 10*3/uL (ref 4.0–10.5)

## 2016-12-21 LAB — URINALYSIS, ROUTINE W REFLEX MICROSCOPIC
BILIRUBIN URINE: NEGATIVE
Glucose, UA: NEGATIVE mg/dL
Hgb urine dipstick: NEGATIVE
Ketones, ur: 20 mg/dL — AB
Nitrite: NEGATIVE
PH: 5 (ref 5.0–8.0)
Protein, ur: 30 mg/dL — AB
Specific Gravity, Urine: 1.02 (ref 1.005–1.030)

## 2016-12-21 LAB — LIPASE, BLOOD: LIPASE: 19 U/L (ref 11–51)

## 2016-12-21 LAB — INFLUENZA PANEL BY PCR (TYPE A & B)
INFLBPCR: NEGATIVE
Influenza A By PCR: POSITIVE — AB

## 2016-12-21 MED ORDER — ONDANSETRON HCL 4 MG/2ML IJ SOLN
4.0000 mg | Freq: Once | INTRAMUSCULAR | Status: AC
  Administered 2016-12-21: 4 mg via INTRAVENOUS
  Filled 2016-12-21: qty 2

## 2016-12-21 MED ORDER — ALBUTEROL SULFATE HFA 108 (90 BASE) MCG/ACT IN AERS
2.0000 | INHALATION_SPRAY | RESPIRATORY_TRACT | Status: DC | PRN
  Administered 2016-12-21: 2 via RESPIRATORY_TRACT
  Filled 2016-12-21: qty 6.7

## 2016-12-21 MED ORDER — SODIUM CHLORIDE 0.9 % IV BOLUS (SEPSIS)
1000.0000 mL | Freq: Once | INTRAVENOUS | Status: AC
  Administered 2016-12-21: 1000 mL via INTRAVENOUS

## 2016-12-21 MED ORDER — ONDANSETRON 4 MG PO TBDP: 4.0000 mg | ORAL_TABLET | Freq: Three times a day (TID) | ORAL | 0 refills | Status: DC | PRN

## 2016-12-21 NOTE — ED Triage Notes (Signed)
Pt here for productive cough, fever and N/V x 3 days; pt hyperventilating and diaphoretic at present

## 2016-12-21 NOTE — ED Notes (Signed)
Pt did not need anything at this time  

## 2016-12-21 NOTE — ED Provider Notes (Addendum)
[facility] Provider Note   CSN: [id number] Arrival date & time: 12/21/16  1439     History   Chief Complaint Chief Complaint  Patient presents with  . Shortness of Breath    HPI Rachel Cole is a 61 y.o. female.  Patient is a relatively healthy 61 year old female presenting today with 3 days of nausea, vomiting, diarrhea, cough productive of yellow sputum, occasional wheezing and subjective fever and chills. She has not been able to hold anything down today and has not eaten in the last several days. She feels globally weak with diffuse myalgias. She also had some pain with inspiration in her chest earlier today. She denies any shortness of breath. She did not receive a flu shot this. She has no lung disease and does not smoke.   The history is provided by the patient.    History reviewed. No pertinent past medical history.  Patient Active Problem List   Diagnosis Date Noted  . Impingement syndrome of right ankle 10/26/2016    Past Surgical History:  Procedure Laterality Date  . ANKLE FRACTURE SURGERY Right 07/01/2016    OB History    No data available       Home Medications    Prior to Admission medications   Medication Sig Start Date End Date Taking? Authorizing Provider  omeprazole (PRILOSEC) 20 MG capsule Take 1 capsule (20 mg total) by mouth daily. Patient not taking: Reported on 11/25/2016 07/31/13   Gerhard Munch, MD  ondansetron (ZOFRAN) 4 MG tablet Take 1 tablet (4 mg total) by mouth every 8 (eight) hours as needed for nausea. Patient not taking: Reported on 11/25/2016 07/31/13   Gerhard Munch, MD  sucralfate (CARAFATE) 1 G tablet Take 1 tablet (1 g total) by mouth 4 (four) times daily. Patient not taking: Reported on 11/25/2016 07/31/13 11/25/16  Gerhard Munch, MD    Family History History reviewed. No pertinent family history.  Social History Social History  Substance Use Topics  . Smoking status: Never Smoker  . Smokeless tobacco: Never  Used  . Alcohol use Yes     Allergies   Bee venom and Penicillins   Review of Systems Review of Systems  All other systems reviewed and are negative.    Physical Exam Updated Vital Signs BP 118/71   Pulse 87   Temp 98.8 F (37.1 C) (Oral)   Resp 21   SpO2 98%   Physical Exam  Constitutional: She is oriented to person, place, and time. She appears well-developed and well-nourished. No distress.  HENT:  Head: Normocephalic and atraumatic.  Mouth/Throat: Mucous membranes are dry.  Eyes: Conjunctivae and EOM are normal. Pupils are equal, round, and reactive to light.  Neck: Normal range of motion. Neck supple.  Cardiovascular: Normal rate, regular rhythm and intact distal pulses.   No murmur heard. Pulmonary/Chest: Effort normal and breath sounds normal. No respiratory distress. She has no wheezes. She has no rales.  Abdominal: Soft. She exhibits no distension. There is no tenderness. There is no rebound and no guarding.  Musculoskeletal: Normal range of motion. She exhibits no edema or tenderness.  Neurological: She is alert and oriented to person, place, and time.  Skin: Skin is warm and dry. Capillary refill takes 2 to 3 seconds. No rash noted. No erythema.  Psychiatric: She has a normal mood and affect. Her behavior is normal.  Nursing note and vitals reviewed.    ED Treatments / Results  Labs (all labs ordered are listed, but only abnormal  results are displayed) Labs Reviewed  COMPREHENSIVE METABOLIC PANEL - Abnormal; Notable for the following:       Result Value   CO2 21 (*)    Glucose, Bld 101 (*)    Calcium 8.8 (*)    Albumin 3.4 (*)    All other components within normal limits  URINALYSIS, ROUTINE W REFLEX MICROSCOPIC - Abnormal; Notable for the following:    Color, Urine AMBER (*)    APPearance CLOUDY (*)    Ketones, ur 20 (*)    Protein, ur 30 (*)    Leukocytes, UA TRACE (*)    Bacteria, UA RARE (*)    Squamous Epithelial / LPF 0-5 (*)    All other  components within normal limits  INFLUENZA PANEL BY PCR (TYPE A & B) - Abnormal; Notable for the following:    Influenza A By PCR POSITIVE (*)    All other components within normal limits  LIPASE, BLOOD  CBC    EKG  EKG Interpretation  Date/Time:  Monday December 21 2016 14:45:14 EST Ventricular Rate:  102 PR Interval:  134 QRS Duration: 62 QT Interval:  356 QTC Calculation: 463 R Axis:   66 Text Interpretation:  Sinus tachycardia Otherwise normal ECG No previous tracing Confirmed by Anitra Lauth  MD, Alphonzo Lemmings ([id number]) on 12/21/2016 6:05:25 PM       Radiology Dg Chest 2 View  Result Date: 12/21/2016 CLINICAL DATA:  Pt here for productive cough, fever and N/V x 3 days; pt hyperventilating and diaphoretic at present EXAM: CHEST  2 VIEW COMPARISON:  07/31/2013 FINDINGS: Heart size is normal. There is mild perihilar peribronchial thickening. There are no focal consolidations. No pleural effusions. There is a wedge compression fracture T7, age indeterminate. IMPRESSION: Mild bronchitic changes. No focal acute pulmonary abnormality. T7 wedge compression fracture, of indeterminate age. Electronically Signed   By: Norva Pavlov M.D.   On: 12/21/2016 15:28    Procedures Procedures (including critical care time)  Medications Ordered in ED Medications  sodium chloride 0.9 % bolus 1,000 mL (1,000 mLs Intravenous New Bag/Given 12/21/16 1851)  ondansetron (ZOFRAN) injection 4 mg (not administered)     Initial Impression / Assessment and Plan / ED Course  I have reviewed the triage vital signs and the nursing notes.  Pertinent labs & imaging results that were available during my care of the patient were reviewed by me and considered in my medical decision making (see chart for details).     Pt with symptoms consistent with influenza.  Exam here is relatively normal except for dehydartion.  No signs of breathing difficulty  No signs of strep pharyngitis, otitis or abnormal abdominal  findings.   CXR wnl and labs pending.  Will given IVF hydration and nausea meds. Will continue antipyretica and rest and fluids and return for any further problems.  11:32 PM Labs reassuring in patients influenza is positive for influenza A. After fluids and Zofran she feels better. She has had no vomiting here. Vital signs continued to be stable. Patient is outside the window for Tamiflu and is not wanting to take it. She was given strict return precautions and warning signs.  Final Clinical Impressions(s) / ED Diagnoses   Final diagnoses:  Influenza A    New Prescriptions New Prescriptions   ONDANSETRON (ZOFRAN ODT) 4 MG DISINTEGRATING TABLET    Take 1 tablet (4 mg total) by mouth every 8 (eight) hours as needed for nausea or vomiting.     Gwyneth Sprout, MD 12/21/16  [address]    Gwyneth SproutWhitney Merita Hawks, MD 12/21/16 2333

## 2016-12-21 NOTE — ED Notes (Signed)
PT did not have to use RR at this time  

## 2016-12-21 NOTE — ED Notes (Signed)
PO Challenge completed.

## 2017-07-03 ENCOUNTER — Emergency Department (HOSPITAL_COMMUNITY): Payer: 59

## 2017-07-03 ENCOUNTER — Encounter (HOSPITAL_COMMUNITY): Payer: Self-pay | Admitting: Emergency Medicine

## 2017-07-03 ENCOUNTER — Emergency Department (HOSPITAL_COMMUNITY)
Admission: EM | Admit: 2017-07-03 | Discharge: 2017-07-03 | Disposition: A | Discharge status: Home / Self Care | Payer: 59 | Attending: Emergency Medicine | Admitting: Emergency Medicine

## 2017-07-03 DIAGNOSIS — Z79899 Other long term (current) drug therapy: Secondary | ICD-10-CM | POA: Insufficient documentation

## 2017-07-03 DIAGNOSIS — F909 Attention-deficit hyperactivity disorder, unspecified type: Secondary | ICD-10-CM | POA: Insufficient documentation

## 2017-07-03 DIAGNOSIS — M25571 Pain in right ankle and joints of right foot: Secondary | ICD-10-CM

## 2017-07-03 HISTORY — DX: Attention-deficit hyperactivity disorder, unspecified type: F90.9

## 2017-07-03 MED ORDER — OXYCODONE-ACETAMINOPHEN 5-325 MG PO TABS: 1.0000 | ORAL_TABLET | Freq: Three times a day (TID) | ORAL | 0 refills | Status: DC | PRN

## 2017-07-03 NOTE — Discharge Instructions (Signed)
Please read attached information regarding your condition and rice therapy. Take Percocet as needed for severe pain. Wear ankle brace as directed. Follow-up with your orthopedist for further evaluation. Return to ED for worsening ankle pain, increased swelling, injuries or falls, signs of joint infection, numbness or trouble walking.

## 2017-07-03 NOTE — ED Triage Notes (Signed)
Pt requested Tylenol . Pt asked what strength she takes. Pt was noted to be restless and speaking in low voice. Pt reported I never take anything not even tylenol. Pt also reports ," My Father is a Careers adviser , My Daughter is a Doctor  And the Pt is a IT consultant . Pt then goes on to report She has a upcoming court date against her orthopedic doctor and does not want any narcotics.

## 2017-07-03 NOTE — ED Notes (Signed)
Declined W/C at D/C and was escorted to lobby by RN. 

## 2017-07-03 NOTE — ED Triage Notes (Addendum)
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

## 2017-07-03 NOTE — ED Provider Notes (Signed)
[facility] DEPT Provider Note   CSN: [id number] Arrival date & time: 07/03/17  1111     History   Chief Complaint Chief Complaint  Patient presents with  . Ankle Pain    HPI Rachel Cole is a 61 y.o. female.  HPI  Patient, who is s/p ORIF of right ankle after fall one year ago, presents to ED for four-day history of ankle pain, worse with flexion of the ankle. She is unsure what brought on the pain because she has not had any recent injuries, falls, and has not had much pain since the surgery 1 year ago. She states that she does not take pain medications usually states that Tylenol has not helped with the pain. 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 She denies any leg swelling, recent surgeries, history of DVT or PE, history of cancer, numbness, weakness.  Past Medical History:  Diagnosis Date  . ADHD     Patient Active Problem List   Diagnosis Date Noted  . Impingement syndrome of right ankle 10/26/2016    Past Surgical History:  Procedure Laterality Date  . ANKLE FRACTURE SURGERY Right 07/01/2016  . GASTRIC BYPASS      OB History    No data available       Home Medications    Prior to Admission medications   Medication Sig Start Date End Date Taking? Authorizing Provider  omeprazole (PRILOSEC) 20 MG capsule Take 1 capsule (20 mg total) by mouth daily. Patient not taking: Reported on 12/21/2016 07/31/13   Gerhard Munch, MD  ondansetron (ZOFRAN ODT) 4 MG disintegrating tablet Take 1 tablet (4 mg total) by mouth every 8 (eight) hours as needed for nausea or vomiting. 12/21/16   Gwyneth Sprout, MD  ondansetron (ZOFRAN) 4 MG tablet Take 1 tablet (4 mg total) by mouth every 8 (eight) hours as needed for nausea. Patient not taking: Reported on 12/21/2016 07/31/13   Gerhard Munch, MD  oxyCODONE-acetaminophen (PERCOCET/ROXICET) 5-325 MG tablet Take 1 tablet by mouth every 8 (eight) hours as needed for severe pain. 07/03/17    Almir Botts, PA-C  sucralfate (CARAFATE) 1 G tablet Take 1 tablet (1 g total) by mouth 4 (four) times daily. Patient not taking: Reported on 12/21/2016 07/31/13 12/21/16  Gerhard Munch, MD    Family History No family history on file.  Social History Social History  Substance Use Topics  . Smoking status: Never Smoker  . Smokeless tobacco: Never Used  . Alcohol use Yes     Allergies   Bee venom and Penicillins   Review of Systems Review of Systems  Constitutional: Negative for chills and fever.  Respiratory: Negative for cough, chest tightness and shortness of breath.   Cardiovascular: Negative for chest pain.  Gastrointestinal: Negative for nausea and vomiting.  Musculoskeletal: Positive for arthralgias and joint swelling. Negative for gait problem and myalgias.  Skin: Negative for color change and wound.     Physical Exam Updated Vital Signs BP 133/86 (BP Location: Left Arm)   Pulse (!) 105   Temp 98 F (36.7 C) (Oral)   Resp 20   Ht 5\' 3"  (1.6 m)   Wt 77.1 kg (170 lb)   SpO2 97%   BMI 30.11 kg/m   Physical Exam  Constitutional: She appears well-developed and well-nourished. No distress.  HENT:  Head: Normocephalic and atraumatic.  Eyes: Conjunctivae and EOM are normal. No scleral icterus.  Neck: Normal range of motion.  Pulmonary/Chest: Effort normal.  No respiratory distress.  Musculoskeletal: Normal range of motion. She exhibits no edema, tenderness or deformity.  No tenderness to palpation of the ankle, however patient reports pain with flexion of the ankle. She is able to perform full active and passive range of motion. No color or temperature changes noted of the joint. There is mild edema noted. No calf tenderness noted. 2+ DP pulses bilaterally. Sensation intact light touch.  Neurological: She is alert.  Skin: No rash noted. She is not diaphoretic.  Psychiatric: She has a normal mood and affect.  Nursing note and vitals reviewed.    ED Treatments  / Results  Labs (all labs ordered are listed, but only abnormal results are displayed) Labs Reviewed - No data to display  EKG  EKG Interpretation None       Radiology Dg Ankle Complete Right  Result Date: 07/03/2017 CLINICAL DATA:  Worsening right ankle pain; Surgery was nearly one year ago; Pt states the pain is becoming worse now than right after surgery, especially upon dorsiflexion EXAM: RIGHT ANKLE - COMPLETE 3+ VIEW COMPARISON:  11/25/2016 FINDINGS: Plate and screw fixation of the distal fibula, and paired Screw fixation of the medial malleolus, stable since prior study. No acute fracture or dislocation. Mild diffuse osteopenia. Calcaneal spurs at the plantar aponeurosis and Achilles tendon insertions. IMPRESSION: 1. Negative for fracture or other acute finding. 2. Osteopenia and postop changes, stable. Electronically Signed   By: Corlis Leak M.D.   On: 07/03/2017 14:15    Procedures Procedures (including critical care time)  Medications Ordered in ED Medications - No data to display   Initial Impression / Assessment and Plan / ED Course  I have reviewed the triage vital signs and the nursing notes.  Pertinent labs & imaging results that were available during my care of the patient were reviewed by me and considered in my medical decision making (see chart for details).  Clinical Course as of Jul 03 1504  Sat Jul 03, 2017  1402 Patient not in room when checking.  [HK]    Clinical Course User Index [HK] Dietrich Pates, PA-C   Patient who is status post right ankle surgery 1 year ago after fall presents to ED for evaluation of 2 week history of right ankle pain. She is unsure why she is having. Because she denies any injuries or falls. She states that she does not take pain medication but the Tylenol has not helped with her pain. She is unable to take NSAIDs due to gastric bypass. She is ambulatory. On physical exam there is mild edema present on right ankle which appears  chronic. She has no calf tenderness, no history of prior DVT or PE, no recent surgeries, no history of cancer,no color or temperature change of joint that would concern me for DVT or septic joint. She has full active and passive range of motion of joint THERE is pain with flexion. X-rays were reviewed and showed no acute findings and no failure or movement of hardware after surgery. Informed patient of these findings and encouraged her to follow up with orthopedist for further evaluation. We'll give short course of pain medications to help with symptoms. Patient states that she has an ankle brace at home. Patient appears stable for discharge at this time. Strict return precautions given. [place] narcotic database reviewed.  Final Clinical Impressions(s) / ED Diagnoses   Final diagnoses:  Acute right ankle pain    New Prescriptions New Prescriptions   OXYCODONE-ACETAMINOPHEN (PERCOCET/ROXICET) 5-325 MG TABLET  Take 1 tablet by mouth every 8 (eight) hours as needed for severe pain.     Dietrich Pates, PA-C 07/03/17 1505    Shaune Pollack, MD 07/05/17 [phone]

## 2017-07-03 NOTE — ED Notes (Signed)
Called patient x3 for radiology, no answer.

## 2017-07-03 NOTE — ED Triage Notes (Signed)
Pt refuses any narcotics, NSAIDS, tylenol not working for pain.

## 2017-08-11 ENCOUNTER — Encounter (HOSPITAL_COMMUNITY): Payer: Self-pay | Admitting: Emergency Medicine

## 2017-08-11 ENCOUNTER — Emergency Department (HOSPITAL_COMMUNITY): Payer: 59

## 2017-08-11 DIAGNOSIS — R197 Diarrhea, unspecified: Secondary | ICD-10-CM | POA: Insufficient documentation

## 2017-08-11 DIAGNOSIS — K0889 Other specified disorders of teeth and supporting structures: Secondary | ICD-10-CM | POA: Diagnosis not present

## 2017-08-11 DIAGNOSIS — R0789 Other chest pain: Secondary | ICD-10-CM | POA: Insufficient documentation

## 2017-08-11 LAB — BASIC METABOLIC PANEL
Anion gap: 13 (ref 5–15)
BUN: 11 mg/dL (ref 6–20)
CALCIUM: 8.9 mg/dL (ref 8.9–10.3)
CO2: 17 mmol/L — ABNORMAL LOW (ref 22–32)
CREATININE: 0.72 mg/dL (ref 0.44–1.00)
Chloride: 106 mmol/L (ref 101–111)
GFR calc Af Amer: >60 mL/min (ref >60)
Glucose, Bld: 117 mg/dL — ABNORMAL HIGH (ref 65–99)
Potassium: 4.4 mmol/L (ref 3.5–5.1)
SODIUM: 136 mmol/L (ref 135–145)

## 2017-08-11 LAB — CBC
HEMATOCRIT: 38.9 % (ref 36.0–46.0)
Hemoglobin: 11.9 g/dL — ABNORMAL LOW (ref 12.0–15.0)
MCH: 25 pg — ABNORMAL LOW (ref 26.0–34.0)
MCHC: 30.6 g/dL (ref 30.0–36.0)
MCV: 81.7 fL (ref 78.0–100.0)
PLATELETS: 433 10*3/uL — AB (ref 150–400)
RBC: 4.76 MIL/uL (ref 3.87–5.11)
RDW: 18.3 % — ABNORMAL HIGH (ref 11.5–15.5)
WBC: 10.6 10*3/uL — AB (ref 4.0–10.5)

## 2017-08-11 LAB — I-STAT TROPONIN, ED: Troponin i, poc: 0 ng/mL (ref 0.00–0.08)

## 2017-08-11 NOTE — ED Triage Notes (Signed)
Patient reports left chest pain onset this afternoon with SOB , nausea and diaphoresis . Pt. added right upper tooth infection/cavity. Denies cough or fever .

## 2017-08-12 ENCOUNTER — Emergency Department (HOSPITAL_COMMUNITY)
Admission: EM | Admit: 2017-08-12 | Discharge: 2017-08-12 | Disposition: A | Discharge status: Home / Self Care | Payer: 59 | Attending: Emergency Medicine | Admitting: Emergency Medicine

## 2017-08-12 DIAGNOSIS — R0789 Other chest pain: Secondary | ICD-10-CM

## 2017-08-12 DIAGNOSIS — R197 Diarrhea, unspecified: Secondary | ICD-10-CM

## 2017-08-12 DIAGNOSIS — K0889 Other specified disorders of teeth and supporting structures: Secondary | ICD-10-CM

## 2017-08-12 HISTORY — DX: Nonrheumatic mitral (valve) prolapse: I34.1

## 2017-08-12 LAB — POC OCCULT BLOOD, ED: FECAL OCCULT BLD: NEGATIVE

## 2017-08-12 LAB — I-STAT TROPONIN, ED: Troponin i, poc: 0 ng/mL (ref 0.00–0.08)

## 2017-08-12 LAB — D-DIMER, QUANTITATIVE (NOT AT ARMC): D DIMER QUANT: 0.49 ug{FEU}/mL (ref 0.00–0.50)

## 2017-08-12 MED ORDER — LOPERAMIDE HCL 2 MG PO CAPS
4.0000 mg | ORAL_CAPSULE | Freq: Once | ORAL | Status: AC
  Administered 2017-08-12: 4 mg via ORAL
  Filled 2017-08-12: qty 2

## 2017-08-12 MED ORDER — ONDANSETRON HCL 4 MG/2ML IJ SOLN
4.0000 mg | Freq: Once | INTRAMUSCULAR | Status: AC
  Administered 2017-08-12: 4 mg via INTRAVENOUS
  Filled 2017-08-12: qty 2

## 2017-08-12 MED ORDER — ONDANSETRON HCL 4 MG PO TABS: 4.0000 mg | ORAL_TABLET | Freq: Three times a day (TID) | ORAL | 0 refills | Status: DC | PRN

## 2017-08-12 MED ORDER — SODIUM CHLORIDE 0.9 % IV BOLUS (SEPSIS)
1000.0000 mL | Freq: Once | INTRAVENOUS | Status: AC
  Administered 2017-08-12: 1000 mL via INTRAVENOUS

## 2017-08-12 MED ORDER — KETOROLAC TROMETHAMINE 30 MG/ML IJ SOLN
30.0000 mg | Freq: Once | INTRAMUSCULAR | Status: AC
  Administered 2017-08-12: 30 mg via INTRAVENOUS
  Filled 2017-08-12: qty 1

## 2017-08-12 NOTE — ED Notes (Signed)
Pt now reporting she's having bloody diarrhea while waiting in the lobby

## 2017-08-12 NOTE — Discharge Instructions (Signed)
Take acetaminophen as needed for pain. Take loperamide (Imodium AD) as needed for diarrhea. Return if symptoms are getting worse.

## 2017-08-12 NOTE — ED Provider Notes (Signed)
[facility] Provider Note   CSN: [id number] Arrival date & time: 08/11/17  2137     History   Chief Complaint Chief Complaint  Patient presents with  . Chest Pain    HPI Rachel Cole is a 61 y.o. female.  The history is provided by the patient.   She has a history of mitral valve prolapse, ADHD, and gastric bypass. 3 days ago, she broke a right upper tooth, and has some infection in the gum. 2 days ago, she started having crampy left lower quadrant pain associated with diarrhea which has been blood-tinged. This afternoon, at about 4 PM, she started having left-sided chest pain with associated dyspnea. She rates her pain at 6/10. Abdominal pain is slightly better following bowel movement. Nothing affects the chest pain. There has been associated nausea but no vomiting. There has been no diaphoresis. She denies fever or chills. She does have a long history of blood-streaked stools, but no prior problem with diarrhea like this. She denies any sick contacts.  Past Medical History:  Diagnosis Date  . ADHD   . Mitral valve prolapse     Patient Active Problem List   Diagnosis Date Noted  . Impingement syndrome of right ankle 10/26/2016    Past Surgical History:  Procedure Laterality Date  . ANKLE FRACTURE SURGERY Right 07/01/2016  . GASTRIC BYPASS      OB History    No data available       Home Medications    Prior to Admission medications   Medication Sig Start Date End Date Taking? Authorizing Provider  omeprazole (PRILOSEC) 20 MG capsule Take 1 capsule (20 mg total) by mouth daily. Patient not taking: Reported on 12/21/2016 07/31/13   Gerhard Munch, MD  ondansetron (ZOFRAN ODT) 4 MG disintegrating tablet Take 1 tablet (4 mg total) by mouth every 8 (eight) hours as needed for nausea or vomiting. 12/21/16   Gwyneth Sprout, MD  ondansetron (ZOFRAN) 4 MG tablet Take 1 tablet (4 mg total) by mouth every 8 (eight) hours as needed for nausea. Patient not taking:  Reported on 12/21/2016 07/31/13   Gerhard Munch, MD  oxyCODONE-acetaminophen (PERCOCET/ROXICET) 5-325 MG tablet Take 1 tablet by mouth every 8 (eight) hours as needed for severe pain. 07/03/17   Khatri, Hina, PA-C  sucralfate (CARAFATE) 1 G tablet Take 1 tablet (1 g total) by mouth 4 (four) times daily. Patient not taking: Reported on 12/21/2016 07/31/13 12/21/16  Gerhard Munch, MD    Family History No family history on file.  Social History Social History  Substance Use Topics  . Smoking status: Never Smoker  . Smokeless tobacco: Never Used  . Alcohol use Yes     Allergies   Bee venom and Penicillins   Review of Systems Review of Systems  All other systems reviewed and are negative.    Physical Exam Updated Vital Signs BP (!) 143/77 (BP Location: Left Arm)   Pulse 88   Temp 98.6 F (37 C) (Oral)   Resp 18   SpO2 98%   Physical Exam  Nursing note and vitals reviewed.  61 year old female, resting comfortably and in no acute distress. Vital signs are significant for borderline hypertension. Oxygen saturation is 98%, which is normal. Head is normocephalic and atraumatic. PERRLA, EOMI. Oropharynx is clear. Mild swelling right upper gingiva with moderate erythema but no drainage. Tooth #5 broken at the gingival margin. Neck is nontender and supple without adenopathy or JVD. Back is nontender and there is no  CVA tenderness. Lungs are clear without rales, wheezes, or rhonchi. Chest is nontender. Heart has regular rate and rhythm without murmur. Abdomen is soft, flat, nontender without masses or hepatosplenomegaly and peristalsis is normoactive. Ectal: Normal sphincter tone. Grayish liquid stool present without gross blood. Extremities have no cyanosis or edema, full range of motion is present. Skin is warm and dry without rash. Neurologic: Mental status is normal, cranial nerves are intact, there are no motor or sensory deficits.  ED Treatments / Results  Labs (all  labs ordered are listed, but only abnormal results are displayed) Labs Reviewed  BASIC METABOLIC PANEL - Abnormal; Notable for the following:       Result Value   CO2 17 (*)    Glucose, Bld 117 (*)    All other components within normal limits  CBC - Abnormal; Notable for the following:    WBC 10.6 (*)    Hemoglobin 11.9 (*)    MCH 25.0 (*)    RDW 18.3 (*)    Platelets 433 (*)    All other components within normal limits  D-DIMER, QUANTITATIVE (NOT AT [facility])  I-STAT TROPONIN, ED  I-STAT TROPONIN, ED  POC OCCULT BLOOD, ED    EKG  EKG Interpretation  Date/Time:  Wednesday August 11 2017 21:43:09 EDT Ventricular Rate:  106 PR Interval:  136 QRS Duration: 66 QT Interval:  332 QTC Calculation: 441 R Axis:   21 Text Interpretation:  Sinus tachycardia Otherwise normal ECG When compared with ECG of 12/21/2016, No significant change was found Confirmed by Dione Booze ([id number]) on 08/11/2017 11:24:50 PM       Radiology Dg Chest 2 View  Result Date: 08/11/2017 CLINICAL DATA:  Chest pain shortness of breath for 3 days. Tooth infection. Nonsmoker. EXAM: CHEST  2 VIEW COMPARISON:  Chest radiograph December 21, 2016 FINDINGS: Cardiomediastinal silhouette is normal. No pleural effusions or focal consolidations. Mild bronchitic changes. Trachea projects midline and there is no pneumothorax. Soft tissue planes and included osseous structures are non-suspicious. Surgical clips in the included right abdomen compatible with cholecystectomy. Pin projects over the lower neck. Old moderate to severe midthoracic compression fracture. IMPRESSION: Stable examination: Mild bronchitic changes without focal consolidation. Electronically Signed   By: Awilda Metro M.D.   On: 08/11/2017 22:28    Procedures Procedures (including critical care time)  Medications Ordered in ED Medications  sodium chloride 0.9 % bolus 1,000 mL (0 mLs Intravenous Stopped 08/12/17 0423)  ketorolac (TORADOL) 30 MG/ML  injection 30 mg (30 mg Intravenous Given 08/12/17 0253)  loperamide (IMODIUM) capsule 4 mg (4 mg Oral Given 08/12/17 0253)  ondansetron (ZOFRAN) injection 4 mg (4 mg Intravenous Given 08/12/17 0253)     Initial Impression / Assessment and Plan / ED Course  I have reviewed the triage vital signs and the nursing notes.  Pertinent labs & imaging results that were available during my care of the patient were reviewed by me and considered in my medical decision making (see chart for details).  Chest pain of uncertain cause. ECG is unremarkable, and initial troponin is normal. Heart Score = 1, which puts her at a very low risk of major adverse cardiac events for the next month. Abdominal pain with diarrhea. Suspect viral gastroenteritis. No red flags to suggest more serious illness. WBC is normal and hemoglobin is stable. Mild decrease in CO2 noted with normal anion gap-probably related to her diarrhea. We will give IV fluids, loperamide. We'll give ketorolac for pain. Will check d-dimer.  She  feels considerably better after above noted treatment. D-dimer is normal. At this point, no indication for advanced imaging. She is discharged with prescription for ondansetron, advised to use over-the-counter loperamide as needed for diarrhea. Unfortunately, because of gastric bypass surgery, she cannot take NSAIDs. Advised to take acetaminophen as needed for pain. Return precautions discussed.  Final Clinical Impressions(s) / ED Diagnoses   Final diagnoses:  Diarrhea of presumed infectious origin  Atypical chest pain  Pain, dental    New Prescriptions Current Discharge Medication List       Dione Booze, MD 08/12/17 0430

## 2017-08-23 ENCOUNTER — Encounter (HOSPITAL_COMMUNITY): Payer: Self-pay | Admitting: Emergency Medicine

## 2017-08-23 ENCOUNTER — Emergency Department (HOSPITAL_COMMUNITY)
Admission: EM | Admit: 2017-08-23 | Discharge: 2017-08-23 | Disposition: A | Discharge status: Home / Self Care | Payer: 59 | Attending: Emergency Medicine | Admitting: Emergency Medicine

## 2017-08-23 DIAGNOSIS — Z9884 Bariatric surgery status: Secondary | ICD-10-CM | POA: Diagnosis not present

## 2017-08-23 DIAGNOSIS — Z5321 Procedure and treatment not carried out due to patient leaving prior to being seen by health care provider: Secondary | ICD-10-CM | POA: Insufficient documentation

## 2017-08-23 DIAGNOSIS — R1084 Generalized abdominal pain: Secondary | ICD-10-CM | POA: Insufficient documentation

## 2017-08-23 DIAGNOSIS — R1110 Vomiting, unspecified: Secondary | ICD-10-CM | POA: Insufficient documentation

## 2017-08-23 DIAGNOSIS — R195 Other fecal abnormalities: Secondary | ICD-10-CM | POA: Diagnosis not present

## 2017-08-23 LAB — COMPREHENSIVE METABOLIC PANEL
ALT: 30 U/L (ref 14–54)
ANION GAP: 9 (ref 5–15)
AST: 31 U/L (ref 15–41)
Albumin: 3.4 g/dL — ABNORMAL LOW (ref 3.5–5.0)
Alkaline Phosphatase: 83 U/L (ref 38–126)
BUN: 8 mg/dL (ref 6–20)
CHLORIDE: 113 mmol/L — AB (ref 101–111)
CO2: 17 mmol/L — AB (ref 22–32)
Calcium: 8.7 mg/dL — ABNORMAL LOW (ref 8.9–10.3)
Creatinine, Ser: 0.63 mg/dL (ref 0.44–1.00)
GFR calc non Af Amer: >60 mL/min (ref >60)
Glucose, Bld: 89 mg/dL (ref 65–99)
Potassium: 4.2 mmol/L (ref 3.5–5.1)
SODIUM: 139 mmol/L (ref 135–145)
Total Bilirubin: 0.4 mg/dL (ref 0.3–1.2)
Total Protein: 7 g/dL (ref 6.5–8.1)

## 2017-08-23 LAB — URINALYSIS, ROUTINE W REFLEX MICROSCOPIC
Bilirubin Urine: NEGATIVE
GLUCOSE, UA: NEGATIVE mg/dL
HGB URINE DIPSTICK: NEGATIVE
KETONES UR: NEGATIVE mg/dL
LEUKOCYTES UA: NEGATIVE
Nitrite: NEGATIVE
PROTEIN: NEGATIVE mg/dL
Specific Gravity, Urine: 1.002 — ABNORMAL LOW (ref 1.005–1.030)
pH: 6 (ref 5.0–8.0)

## 2017-08-23 LAB — CBC
HCT: 36.5 % (ref 36.0–46.0)
Hemoglobin: 10.9 g/dL — ABNORMAL LOW (ref 12.0–15.0)
MCH: 25.1 pg — AB (ref 26.0–34.0)
MCHC: 29.9 g/dL — ABNORMAL LOW (ref 30.0–36.0)
MCV: 83.9 fL (ref 78.0–100.0)
PLATELETS: 406 10*3/uL — AB (ref 150–400)
RBC: 4.35 MIL/uL (ref 3.87–5.11)
RDW: 18.4 % — AB (ref 11.5–15.5)
WBC: 8.9 10*3/uL (ref 4.0–10.5)

## 2017-08-23 LAB — LIPASE, BLOOD: LIPASE: 27 U/L (ref 11–51)

## 2017-08-23 NOTE — ED Triage Notes (Signed)
Pt reports generalized abd pain with intermittent blood in stools. Pt reports history of gastric bypass surgery. Pt reports 4 episodes of vomiting in the last 24 hrs.

## 2017-08-23 NOTE — ED Notes (Signed)
Called pt name x3 for vitals. No response. 

## 2017-08-27 ENCOUNTER — Other Ambulatory Visit: Payer: Self-pay | Admitting: Surgery

## 2017-08-27 DIAGNOSIS — K289 Gastrojejunal ulcer, unspecified as acute or chronic, without hemorrhage or perforation: Secondary | ICD-10-CM

## 2017-09-02 ENCOUNTER — Ambulatory Visit
Admission: RE | Admit: 2017-09-02 | Discharge: 2017-09-02 | Disposition: A | Discharge status: Home / Self Care | Payer: 59 | Source: Ambulatory Visit | Attending: Surgery | Admitting: Surgery

## 2017-09-02 DIAGNOSIS — K289 Gastrojejunal ulcer, unspecified as acute or chronic, without hemorrhage or perforation: Secondary | ICD-10-CM

## 2017-11-04 IMAGING — CR DG CHEST 2V
2 series · 2 of 2 positions shown · non-contrast
Comparison: Chest radiograph December 21, 2016

CLINICAL DATA: Chest pain shortness of breath for 3 days. Tooth
infection. Nonsmoker.

EXAM:
CHEST  2 VIEW

[chest pa]
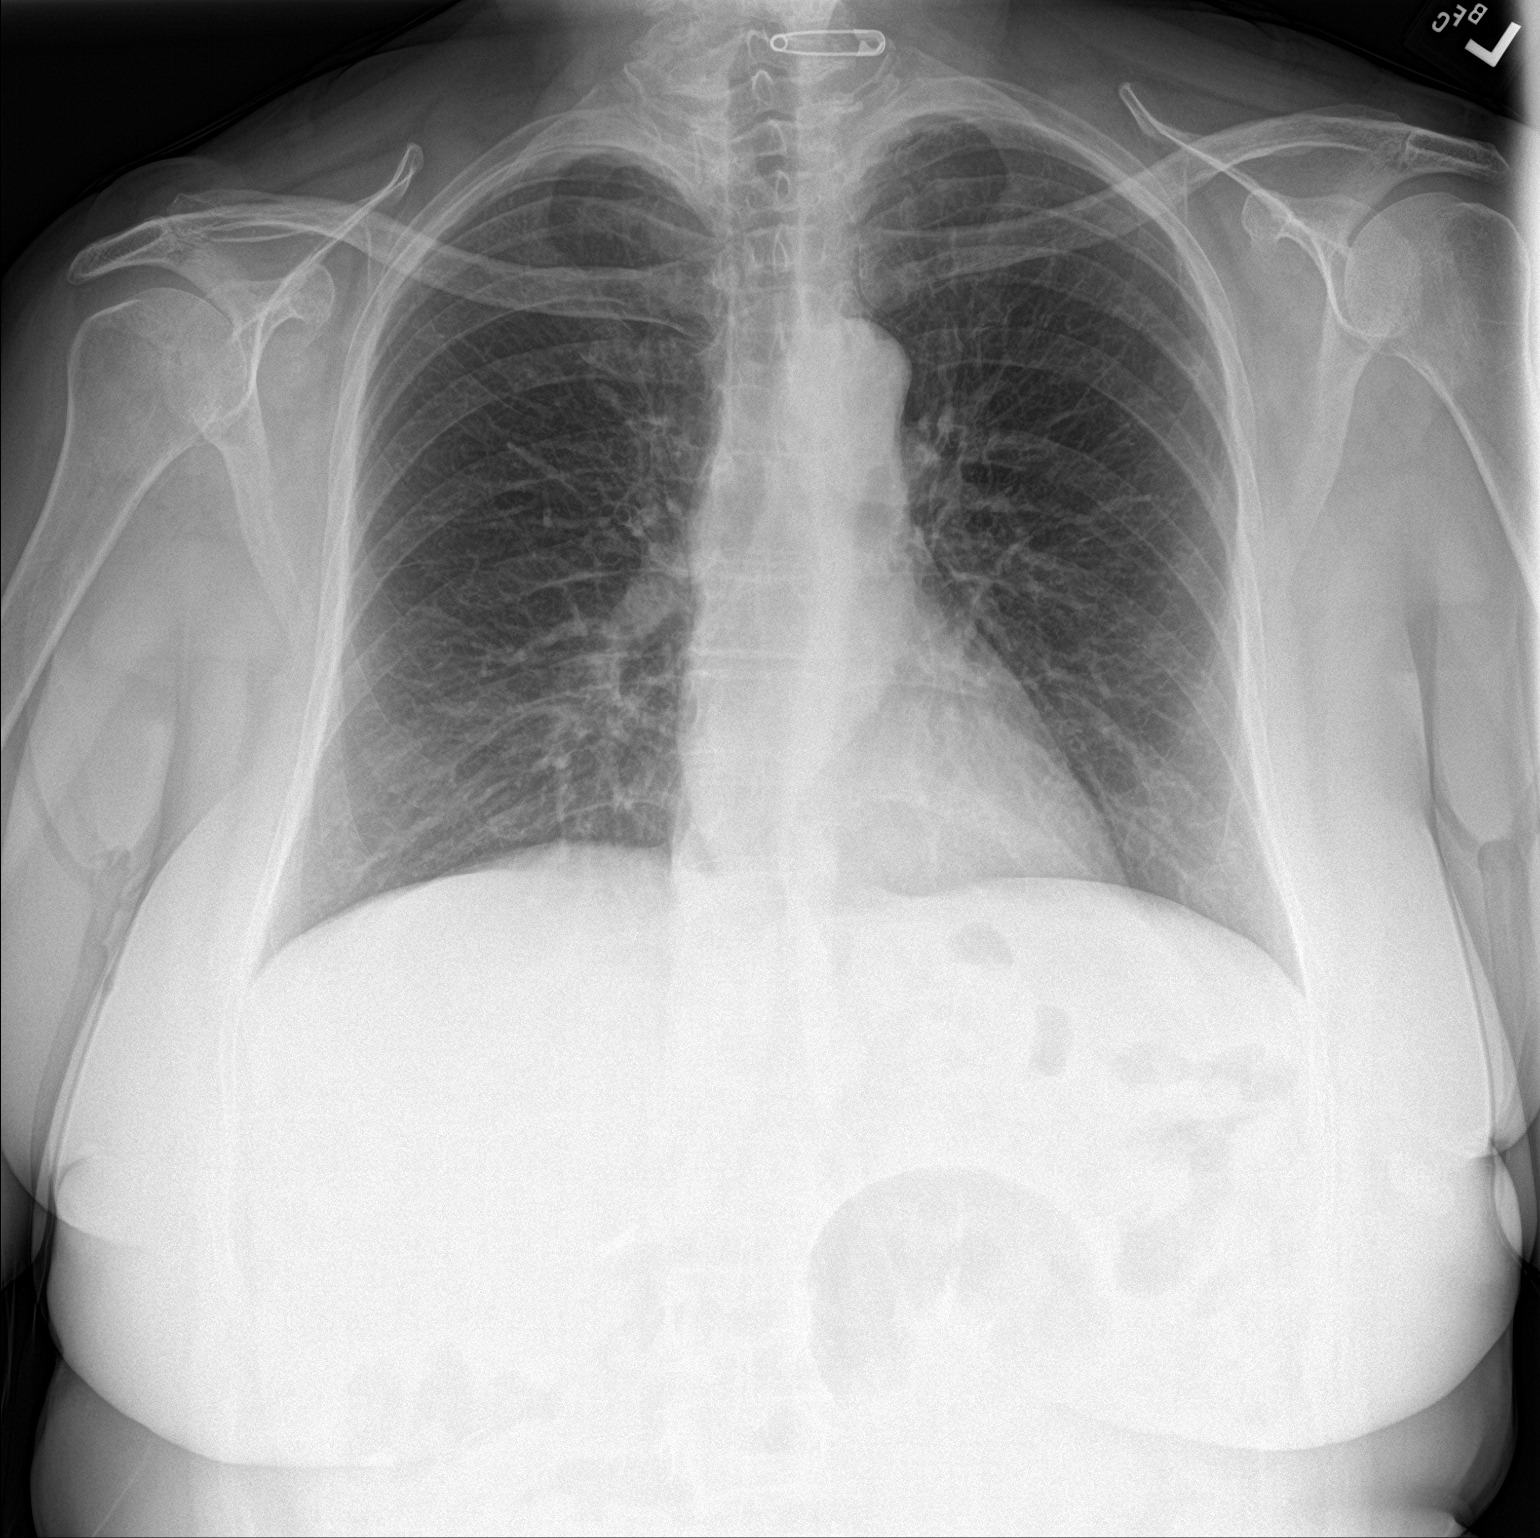

[chest lat]
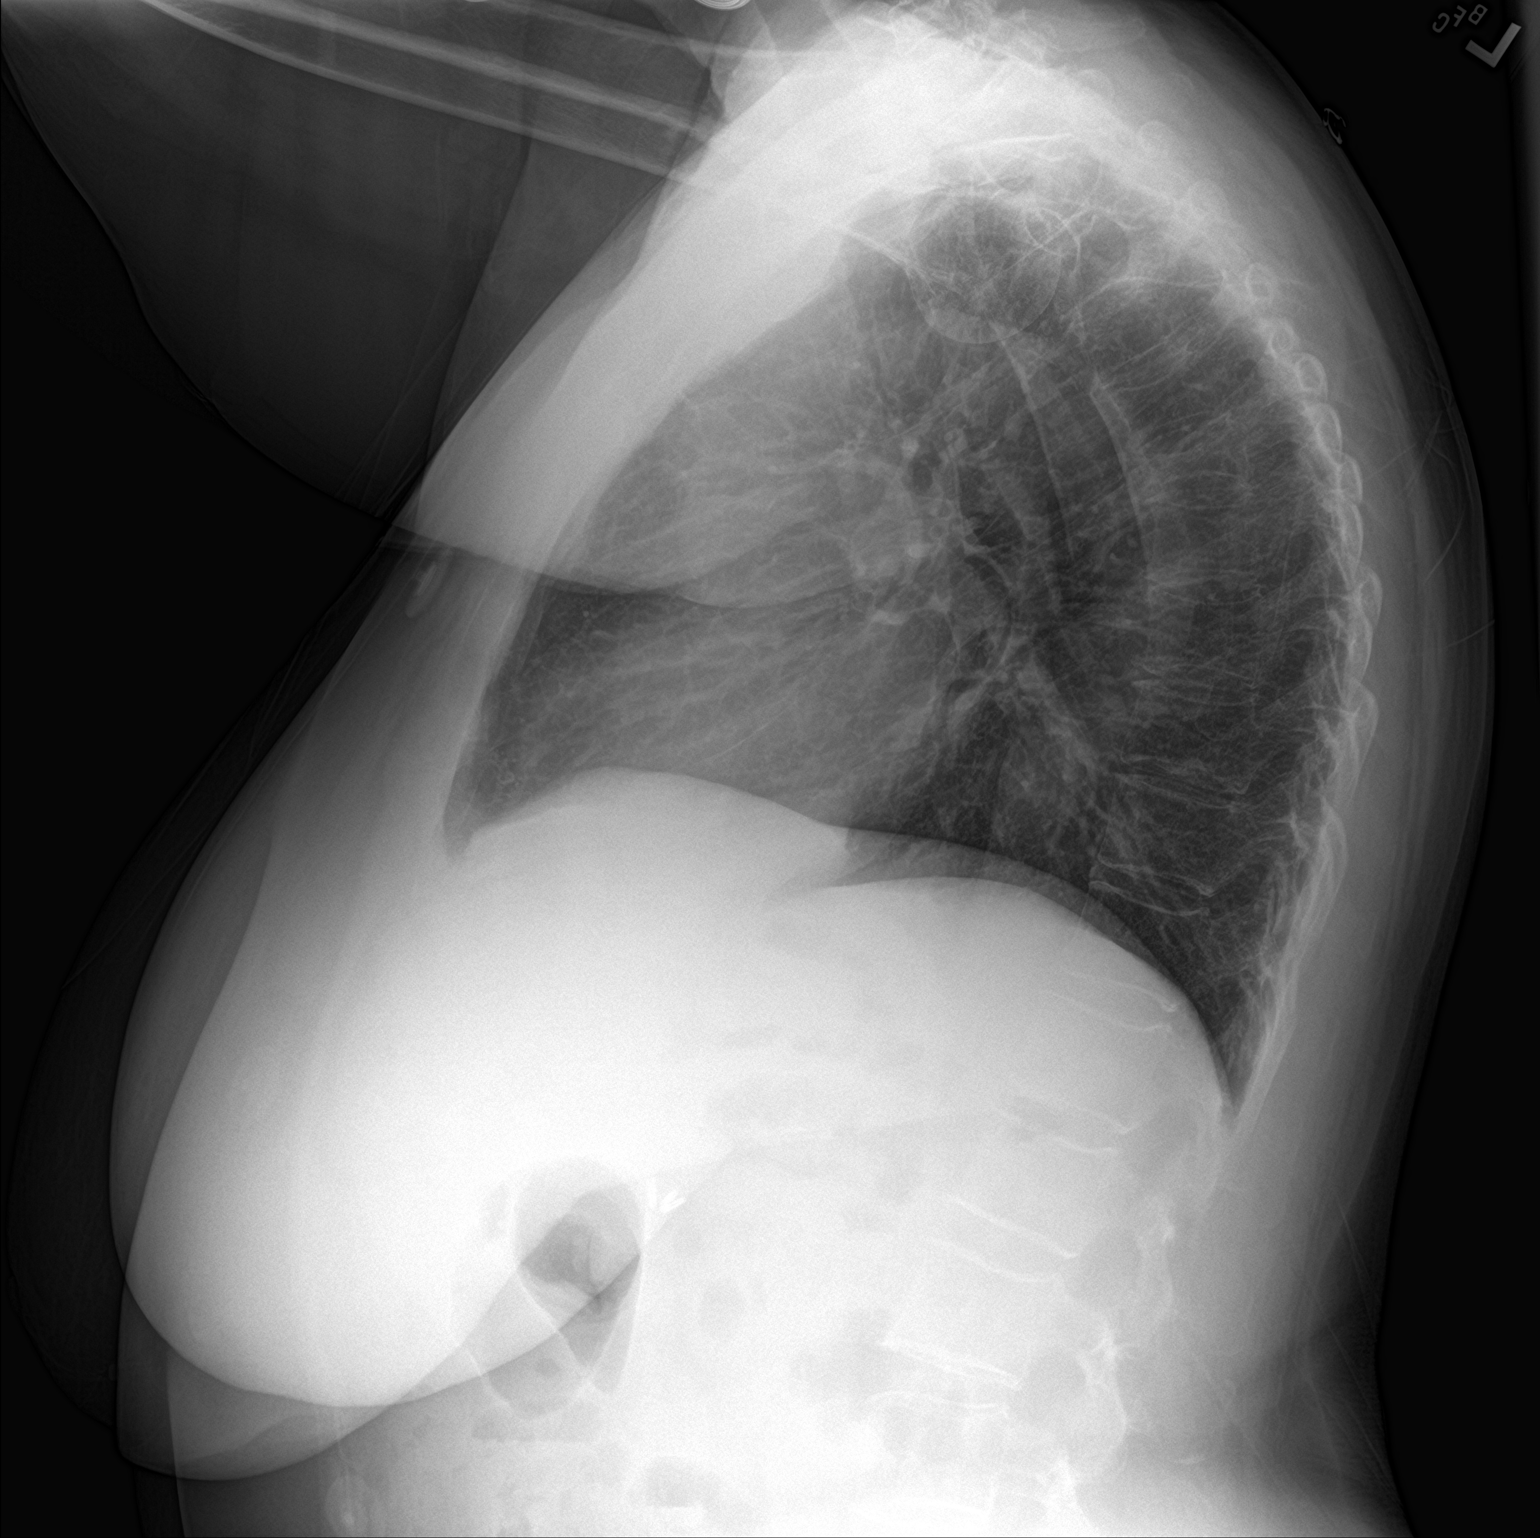

[2 of 2 positions shown; findings below may reference images not displayed]

FINDINGS: Cardiomediastinal silhouette is normal. No pleural effusions or
focal consolidations. Mild bronchitic changes. Trachea projects
midline and there is no pneumothorax. Soft tissue planes and
included osseous structures are non-suspicious. Surgical clips in
the included right abdomen compatible with cholecystectomy. Pin
projects over the lower neck. Old moderate to severe midthoracic
compression fracture.
IMPRESSION: Stable examination: Mild bronchitic changes without focal
consolidation.

## 2018-04-04 ENCOUNTER — Other Ambulatory Visit: Payer: Self-pay

## 2018-04-04 ENCOUNTER — Encounter (HOSPITAL_COMMUNITY): Payer: Self-pay | Admitting: *Deleted

## 2018-04-04 ENCOUNTER — Emergency Department (HOSPITAL_COMMUNITY): Payer: Self-pay

## 2018-04-04 ENCOUNTER — Emergency Department (HOSPITAL_COMMUNITY)
Admission: EM | Admit: 2018-04-04 | Discharge: 2018-04-05 | Disposition: A | Discharge status: Home / Self Care | Payer: Self-pay | Attending: Emergency Medicine | Admitting: Emergency Medicine

## 2018-04-04 DIAGNOSIS — Z79899 Other long term (current) drug therapy: Secondary | ICD-10-CM | POA: Insufficient documentation

## 2018-04-04 DIAGNOSIS — R042 Hemoptysis: Secondary | ICD-10-CM | POA: Insufficient documentation

## 2018-04-04 DIAGNOSIS — R0602 Shortness of breath: Secondary | ICD-10-CM | POA: Insufficient documentation

## 2018-04-04 LAB — COMPREHENSIVE METABOLIC PANEL
ALBUMIN: 3.5 g/dL (ref 3.5–5.0)
ALK PHOS: 107 U/L (ref 38–126)
ALT: 35 U/L (ref 14–54)
ANION GAP: 11 (ref 5–15)
AST: 28 U/L (ref 15–41)
BUN: 7 mg/dL (ref 6–20)
CALCIUM: 8.8 mg/dL — AB (ref 8.9–10.3)
CO2: 19 mmol/L — AB (ref 22–32)
Chloride: 109 mmol/L (ref 101–111)
Creatinine, Ser: 0.76 mg/dL (ref 0.44–1.00)
GFR calc Af Amer: >60 mL/min (ref >60)
GFR calc non Af Amer: >60 mL/min (ref >60)
GLUCOSE: 114 mg/dL — AB (ref 65–99)
Potassium: 4 mmol/L (ref 3.5–5.1)
SODIUM: 139 mmol/L (ref 135–145)
Total Bilirubin: 0.5 mg/dL (ref 0.3–1.2)
Total Protein: 7.2 g/dL (ref 6.5–8.1)

## 2018-04-04 LAB — CBC WITH DIFFERENTIAL/PLATELET
ABS IMMATURE GRANULOCYTES: 0 10*3/uL (ref 0.0–0.1)
BASOS PCT: 1 %
Basophils Absolute: 0.1 10*3/uL (ref 0.0–0.1)
Eosinophils Absolute: 0.2 10*3/uL (ref 0.0–0.7)
Eosinophils Relative: 2 %
HCT: 37.9 % (ref 36.0–46.0)
Hemoglobin: 11.4 g/dL — ABNORMAL LOW (ref 12.0–15.0)
Immature Granulocytes: 1 %
LYMPHS PCT: 34 %
Lymphs Abs: 2.9 10*3/uL (ref 0.7–4.0)
MCH: 25.3 pg — AB (ref 26.0–34.0)
MCHC: 30.1 g/dL (ref 30.0–36.0)
MCV: 84.2 fL (ref 78.0–100.0)
MONO ABS: 0.7 10*3/uL (ref 0.1–1.0)
MONOS PCT: 8 %
NEUTROS ABS: 4.6 10*3/uL (ref 1.7–7.7)
Neutrophils Relative %: 54 %
Platelets: 382 10*3/uL (ref 150–400)
RBC: 4.5 MIL/uL (ref 3.87–5.11)
RDW: 17.6 % — ABNORMAL HIGH (ref 11.5–15.5)
WBC: 8.5 10*3/uL (ref 4.0–10.5)

## 2018-04-04 LAB — I-STAT CG4 LACTIC ACID, ED
Lactic Acid, Venous: 3.18 mmol/L (ref 0.5–1.9)
Lactic Acid, Venous: 2.68 mmol/L (ref 0.5–1.9)

## 2018-04-04 LAB — TROPONIN I: Troponin I: <0.03 ng/mL (ref <0.03)

## 2018-04-04 MED ORDER — SODIUM CHLORIDE 0.9 % IV BOLUS
1000.0000 mL | Freq: Once | INTRAVENOUS | Status: AC
  Administered 2018-04-04: 1000 mL via INTRAVENOUS

## 2018-04-04 NOTE — ED Notes (Signed)
MD notified of Pt's lactic acid

## 2018-04-04 NOTE — ED Provider Notes (Signed)
[facility] EMERGENCY DEPARTMENT Provider Note   CSN: [id number] Arrival date & time: 04/04/18  1926     History   Chief Complaint Chief Complaint  Patient presents with  . Cough    HPI Rachel Cole is a 62 y.o. female.   62 year old female with a history of mitral valve prolapse and ADHD presents to the emergency department for complaints of shortness of breath.  Patient reports worsening shortness of breath over the past few months.  She has noticed worsening shortness of breath with exertion, stating that she is no longer able to walk any distance without feeling winded.  Symptoms associated with hemoptysis.  Patient states that she has been experiencing these symptoms for at least the past few months.  She provides images during her encounter dating back to at least March where she has had episodes of hemoptysis producing clots.  She states that she has a strong family history of idiopathic pulmonary fibrosis.  Patient endorses conversations with her primary doctor regarding these symptoms, but states that she was told that she may have just "irritated my bronchioles".  She denies any associated fevers, chest pain, leg swelling.  She has history of ankle surgery 1 year ago.  Patient does note history of blood clots in her mother, but no history of DVT or PE in self.     Past Medical History:  Diagnosis Date  . ADHD   . Mitral valve prolapse     Patient Active Problem List   Diagnosis Date Noted  . Impingement syndrome of right ankle 10/26/2016    Past Surgical History:  Procedure Laterality Date  . ANKLE FRACTURE SURGERY Right 07/01/2016  . GASTRIC BYPASS       OB History   None      Home Medications    Prior to Admission medications   Medication Sig Start Date End Date Taking? Authorizing Provider  amphetamine-dextroamphetamine (ADDERALL) 20 MG tablet Take 20 mg by mouth 3 (three) times daily.   Yes [provider]  ondansetron  (ZOFRAN) 4 MG tablet Take 1 tablet (4 mg total) by mouth every 8 (eight) hours as needed for nausea. 08/12/17  Yes Dione Booze, MD  benzonatate (TESSALON) 100 MG capsule Take 1 capsule (100 mg total) by mouth 3 (three) times daily as needed for cough. 04/05/18   Antony Madura, PA-C    Family History No family history on file.  Social History Social History   Tobacco Use  . Smoking status: Never Smoker  . Smokeless tobacco: Never Used  Substance Use Topics  . Alcohol use: Yes  . Drug use: No     Allergies   Bee venom; Cat hair extract; Ivp dye [iodinated diagnostic agents]; and Penicillins   Review of Systems Review of Systems Ten systems reviewed and are negative for acute change, except as noted in the HPI.    Physical Exam Updated Vital Signs BP 129/83   Pulse 98   Temp 98.2 F (36.8 C) (Oral)   Resp 15   SpO2 98%   Physical Exam  Constitutional: She is oriented to person, place, and time. She appears well-developed and well-nourished. No distress.  Nontoxic appearing, pleasant.  HENT:  Head: Normocephalic and atraumatic.  Eyes: Conjunctivae and EOM are normal. No scleral icterus.  Neck: Normal range of motion.  Cardiovascular: Regular rhythm and intact distal pulses.  Tachycardia between 105-115bpm  Pulmonary/Chest: Effort normal. No stridor. No respiratory distress. She has no wheezes. She has no rales.  Respirations even and unlabored. Lungs CTAB.  Musculoskeletal: Normal range of motion.  Neurological: She is alert and oriented to person, place, and time. She exhibits normal muscle tone. Coordination normal.  GCS 15. Patient moving all extremities vigorously.  Skin: Skin is warm and dry. No rash noted. She is not diaphoretic. No erythema. No pallor.  Psychiatric: She has a normal mood and affect. Her behavior is normal.  Nursing note and vitals reviewed.    ED Treatments / Results  Labs (all labs ordered are listed, but only abnormal results are  displayed) Labs Reviewed  COMPREHENSIVE METABOLIC PANEL - Abnormal; Notable for the following components:      Result Value   CO2 19 (*)    Glucose, Bld 114 (*)    Calcium 8.8 (*)    All other components within normal limits  CBC WITH DIFFERENTIAL/PLATELET - Abnormal; Notable for the following components:   Hemoglobin 11.4 (*)    MCH 25.3 (*)    RDW 17.6 (*)    All other components within normal limits  I-STAT CG4 LACTIC ACID, ED - Abnormal; Notable for the following components:   Lactic Acid, Venous 3.18 (*)    All other components within normal limits  I-STAT CG4 LACTIC ACID, ED - Abnormal; Notable for the following components:   Lactic Acid, Venous 2.68 (*)    All other components within normal limits  CULTURE, BLOOD (ROUTINE X 2)  CULTURE, BLOOD (ROUTINE X 2)  TROPONIN I    EKG EKG Interpretation  Date/Time:  Monday Apr 04 2018 22:26:45 EDT Ventricular Rate:  99 PR Interval:    QRS Duration: 75 QT Interval:  384 QTC Calculation: 493 R Axis:   2 Text Interpretation:  Sinus rhythm Probable left atrial enlargement Low voltage, precordial leads Minimal ST depression, lateral leads Borderline prolonged QT interval When compared with ECG of 08/11/2017, QT has lengthened Confirmed by Dione Booze ([id number]) on 04/05/2018 1:12:54 AM   Radiology Dg Chest 2 View  Result Date: 04/04/2018 CLINICAL DATA:  Chronic shortness of breath and hemoptysis. EXAM: CHEST - 2 VIEW COMPARISON:  Chest radiograph performed 08/11/2017 FINDINGS: The lungs are well-aerated and clear. There is no evidence of focal opacification, pleural effusion or pneumothorax. The heart is normal in size; the mediastinal contour is within normal limits. No acute osseous abnormalities are seen. Clips are noted within the right upper quadrant, reflecting prior cholecystectomy. IMPRESSION: No acute cardiopulmonary process seen. Electronically Signed   By: Roanna Raider M.D.   On: 04/04/2018 20:44   Ct Angio Chest Pe W  And/or Wo Contrast  Result Date: 04/05/2018 CLINICAL DATA:  Hemoptysis EXAM: CT ANGIOGRAPHY CHEST WITH CONTRAST TECHNIQUE: Multidetector CT imaging of the chest was performed using the standard protocol during bolus administration of intravenous contrast. Multiplanar CT image reconstructions and MIPs were obtained to evaluate the vascular anatomy. CONTRAST:  ISOVUE-370 IOPAMIDOL (ISOVUE-370) INJECTION 76% COMPARISON:  Chest radiograph 04/04/2018 FINDINGS: Cardiovascular: --Pulmonary arteries: Contrast injection is sufficient to demonstrate satisfactory opacification of the pulmonary arteries to the segmental level. There is no pulmonary embolus. The main pulmonary artery is within normal limits for size. --Aorta: Limited opacification of the aorta due to bolus timing optimization for the pulmonary arteries. Conventional 3 vessel aortic branching pattern. The aortic course and caliber are normal. There is no aortic atherosclerosis. --Heart: Normal size. No pericardial effusion. Mediastinum/Nodes: No mediastinal, hilar or axillary lymphadenopathy. The visualized thyroid and thoracic esophageal course are unremarkable. Lungs/Pleura: No pulmonary nodules or masses. No pleural  effusion or pneumothorax. No focal airspace consolidation. No focal pleural abnormality. Upper Abdomen: Contrast bolus timing is not optimized for evaluation of the abdominal organs. Status post gastric bypass. Musculoskeletal: No chest wall abnormality. No acute or significant osseous findings. Review of the MIP images confirms the above findings. IMPRESSION: No pulmonary embolus or other acute thoracic abnormality. Electronically Signed   By: Deatra Robinson M.D.   On: 04/05/2018 05:08    Procedures Procedures (including critical care time)  Medications Ordered in ED Medications  iopamidol (ISOVUE-370) 76 % injection 100 mL (has no administration in time range)  methylPREDNISolone sodium succinate (SOLU-MEDROL) 40 mg/mL injection  40 mg (0 mg Intravenous Hold 04/05/18 0510)  sodium chloride 0.9 % bolus 1,000 mL (0 mLs Intravenous Stopped 04/05/18 0211)  methylPREDNISolone sodium succinate (SOLU-MEDROL) 40 mg/mL injection 40 mg (40 mg Intravenous Given 04/05/18 0028)  diphenhydrAMINE (BENADRYL) injection 50 mg (50 mg Intravenous Given 04/05/18 0331)  iopamidol (ISOVUE-370) 76 % injection (100 mLs  Contrast Given 04/05/18 0444)     Initial Impression / Assessment and Plan / ED Course  I have reviewed the triage vital signs and the nursing notes.  Pertinent labs & imaging results that were available during my care of the patient were reviewed by me and considered in my medical decision making (see chart for details).     62 year old female presents to the emergency department for evaluation of hemoptysis.  She states that this has been ongoing over the past few months.  She presents with pictures of blood in her hands dating back to March which she states occurred after coughing up blood clots.  Aside from slight elevation of lactate level, patient has a reassuring laboratory work-up.  She was noted to be tachycardic on physical exam.  This has fluctuated since arrival.  Blood cultures were drawn and are pending, though patient does not currently meet SIRS or Sepsis criteria (no leukocytosis, consistent tachypnea, hypotension).  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  With tachycardia, there is also concern for possible PE.  A CT angiogram was ordered for further evaluation of symptoms.  Initial screening chest x-ray reassuring.  CT angiogram negative for PE.  There is no evidence of pulmonary nodules, masses, effusion, pneumothorax.  No airspace consolidation or pleural abnormality.  The patient has remained hemodynamically stable since arrival, pleasant, afebrile.  Given chronicity of symptoms, I believe that she can continue with outpatient work-up.  Referral given to pulmonology.  Have discussed the  possibility for outpatient bronchoscopy.  Patient verbalizes understanding.  She has been encouraged to continue follow-up with her primary care doctor.  Return precautions discussed and provided. Patient discharged in stable condition with no unaddressed concerns.   Final Clinical Impressions(s) / ED Diagnoses   Final diagnoses:  Cough with hemoptysis    ED Discharge Orders        Ordered    benzonatate (TESSALON) 100 MG capsule  3 times daily PRN     04/05/18 0542       Antony Madura, PA-C 04/05/18 9147    Gwyneth Sprout, MD 04/05/18 2247

## 2018-04-04 NOTE — ED Notes (Signed)
No response when pt. Called for vitals x3.

## 2018-04-04 NOTE — ED Notes (Signed)
ED Provider at bedside. 

## 2018-04-04 NOTE — ED Triage Notes (Signed)
Pt was bit by a tick 6 weeks ago and has had a rash that has seen gone. Reports at that time there was a circular rash where bite occurred. Pt also has had a dry cough for 6 months and has recently been coughing up blood clots. Now c/o malaise, fatigue, and increased sob on exertion

## 2018-04-05 ENCOUNTER — Emergency Department (HOSPITAL_COMMUNITY): Payer: Self-pay

## 2018-04-05 MED ORDER — DIPHENHYDRAMINE HCL 50 MG/ML IJ SOLN
50.0000 mg | Freq: Once | INTRAMUSCULAR | Status: AC
  Administered 2018-04-05: 50 mg via INTRAVENOUS
  Filled 2018-04-05: qty 1

## 2018-04-05 MED ORDER — BENZONATATE 100 MG PO CAPS: 100.0000 mg | ORAL_CAPSULE | Freq: Three times a day (TID) | ORAL | 0 refills | Status: DC | PRN

## 2018-04-05 MED ORDER — IOPAMIDOL (ISOVUE-370) INJECTION 76%: 100.0000 mL | Freq: Once | INTRAVENOUS | Status: DC | PRN

## 2018-04-05 MED ORDER — METHYLPREDNISOLONE SODIUM SUCC 40 MG IJ SOLR: 40.0000 mg | Freq: Once | INTRAMUSCULAR | Status: DC

## 2018-04-05 MED ORDER — DIPHENHYDRAMINE HCL 50 MG/ML IJ SOLN: 50.0000 mg | Freq: Once | INTRAMUSCULAR | Status: DC

## 2018-04-05 MED ORDER — IOPAMIDOL (ISOVUE-370) INJECTION 76%
INTRAVENOUS | Status: AC
  Administered 2018-04-05: 100 mL
  Filled 2018-04-05: qty 100

## 2018-04-05 MED ORDER — METHYLPREDNISOLONE SODIUM SUCC 40 MG IJ SOLR
40.0000 mg | Freq: Once | INTRAMUSCULAR | Status: AC
  Administered 2018-04-05: 40 mg via INTRAVENOUS
  Filled 2018-04-05: qty 1

## 2018-04-05 NOTE — Discharge Instructions (Addendum)
The CT angiogram of your chest today was reassuring.  You have been prescribed Tessalon for cough management. We recommend follow up with a pulmonologist. You would likely benefit from bronchoscopy in the near future. Continue follow up with your primary care doctor. Return for any new or concerning symptoms, or if symptoms worsen.

## 2018-04-05 NOTE — ED Notes (Signed)
Attempted IV x2. 

## 2018-04-05 NOTE — ED Notes (Signed)
Called CT made aware patient has received Pretreatment medication and will reach 4 hours at 0430

## 2018-04-09 LAB — CULTURE, BLOOD (ROUTINE X 2)
CULTURE: NO GROWTH
Culture: NO GROWTH

## 2018-09-04 ENCOUNTER — Other Ambulatory Visit: Payer: Self-pay

## 2018-09-04 ENCOUNTER — Encounter (HOSPITAL_COMMUNITY): Payer: Self-pay | Admitting: Emergency Medicine

## 2018-09-04 ENCOUNTER — Emergency Department (HOSPITAL_COMMUNITY): Payer: Self-pay

## 2018-09-04 ENCOUNTER — Emergency Department (HOSPITAL_COMMUNITY)
Admission: EM | Admit: 2018-09-04 | Discharge: 2018-09-04 | Disposition: A | Discharge status: Home / Self Care | Payer: Self-pay | Attending: Emergency Medicine | Admitting: Emergency Medicine

## 2018-09-04 DIAGNOSIS — Z79899 Other long term (current) drug therapy: Secondary | ICD-10-CM | POA: Insufficient documentation

## 2018-09-04 DIAGNOSIS — M25571 Pain in right ankle and joints of right foot: Secondary | ICD-10-CM | POA: Insufficient documentation

## 2018-09-04 NOTE — ED Triage Notes (Signed)
Pt reports R ankle swelling and pain increasing over past 3 days, denies injury.  Pt reports hx ORIF 2 years ago.

## 2018-09-04 NOTE — ED Notes (Signed)
Patient transported to X-ray 

## 2018-09-04 NOTE — ED Provider Notes (Signed)
[facility] EMERGENCY DEPARTMENT Provider Note   CSN: [id number] Arrival date & time: 09/04/18  1031     History   Chief Complaint Chief Complaint  Patient presents with  . Ankle Pain    HPI Rachel Cole is a 62 y.o. female past medical history significant for ORIF right ankle (2017) and is post gastric bypass who presents for evaluation of right ankle swelling.  Patient states that she has had gradual swelling to her right ankle over the past 3 days.  Patient states that she was walking and did feel a "tweak" to her right ankle.  Has had gradual swelling since then.  States she has pain located to the lateral malleolus.  Pain is rated a 5/10.  Has taken Tylenol with mild relief of her symptoms.  Patient states she cannot take NSAIDs secondary to her gastric bypass surgery denies fever, chills, erythema, ecchymosis, warmth, numbness/tingling, color change to right lower extremity.  Patient has been able to ambulate however with pain.  History obtained from patient.  No interpreter was used.  HPI  Past Medical History:  Diagnosis Date  . ADHD   . Mitral valve prolapse     Patient Active Problem List   Diagnosis Date Noted  . Impingement syndrome of right ankle 10/26/2016    Past Surgical History:  Procedure Laterality Date  . ANKLE FRACTURE SURGERY Right 07/01/2016  . GASTRIC BYPASS       OB History   None      Home Medications    Prior to Admission medications   Medication Sig Start Date End Date Taking? Authorizing Provider  amphetamine-dextroamphetamine (ADDERALL) 20 MG tablet Take 20 mg by mouth 3 (three) times daily.    [provider]  benzonatate (TESSALON) 100 MG capsule Take 1 capsule (100 mg total) by mouth 3 (three) times daily as needed for cough. 04/05/18   Antony Madura, PA-C  ondansetron (ZOFRAN) 4 MG tablet Take 1 tablet (4 mg total) by mouth every 8 (eight) hours as needed for nausea. 08/12/17   Dione Booze, MD    Family  History No family history on file.  Social History Social History   Tobacco Use  . Smoking status: Never Smoker  . Smokeless tobacco: Never Used  Substance Use Topics  . Alcohol use: Yes  . Drug use: No     Allergies   Bee venom; Cat hair extract; Ivp dye [iodinated diagnostic agents]; and Penicillins   Review of Systems Review of Systems  Constitutional: Negative.   Respiratory: Negative.   Cardiovascular: Negative.   Gastrointestinal: Negative.   Genitourinary: Negative.   Musculoskeletal:       Right ankle pain and swelling  Skin: Negative.   All other systems reviewed and are negative.    Physical Exam Updated Vital Signs BP 112/77 (BP Location: Right Arm)   Pulse 86   Resp 16   SpO2 100%   Physical Exam  Constitutional: She appears well-developed and well-nourished. No distress.  HENT:  Head: Atraumatic.  Mouth/Throat: Oropharynx is clear and moist.  Eyes: Pupils are equal, round, and reactive to light.  Neck: Normal range of motion.  Cardiovascular: Normal rate and intact distal pulses.  Pulmonary/Chest: No respiratory distress.  Abdominal: She exhibits no distension.  Musculoskeletal: Normal range of motion.  Mild tenderness palpation to lateral malleolus.  No tenderness palpation over medial malleolus or navicular.  Moderate soft tissue swelling over lateral malleolus.  No edema, erythema or warmth to right lower  extremity.  No calf tenderness or calf swelling.  Full plantar flexion dorsiflexion without difficulty.  Difficulty with inversion secondary to lateral pain.  Able to ambulate around room without difficulty.  Neurological: She is alert.  Intact sensation to sharp and dull to right lower extremity.  Skin: Skin is warm and dry. She is not diaphoretic.  No ecchymosis, erythema or warmth to right lower extremity.  Soft tissue swelling to right ankle, lateral malleolus greater than medial malleolus.  Psychiatric: She has a normal mood and affect.    Nursing note and vitals reviewed.    ED Treatments / Results  Labs (all labs ordered are listed, but only abnormal results are displayed) Labs Reviewed - No data to display  EKG None  Radiology Dg Ankle Complete Right  Result Date: 09/04/2018 CLINICAL DATA:  Worsening right ankle pain and swelling since a twisting injury 4 days ago. EXAM: RIGHT ANKLE - COMPLETE 3+ VIEW COMPARISON:  07/03/2017. FINDINGS: Diffuse soft tissue swelling. Stable medial and lateral malleolus fixation hardware. No fracture or dislocation seen. Moderate inferior and mild posterior calcaneal spur formation. IMPRESSION: 1. No fracture or dislocation. 2. Hardware intact. Electronically Signed   By: Beckie Salts M.D.   On: 09/04/2018 11:49    Procedures Procedures (including critical care time)  Medications Ordered in ED Medications - No data to display   Initial Impression / Assessment and Plan / ED Course  I have reviewed the triage vital signs and the nursing notes.  Pertinent labs & imaging results that were available during my care of the patient were reviewed by me and considered in my medical decision making (see chart for details).  62 year old female who appears otherwise well presents for evaluation of right ankle pain and swelling.  Afebrile, nonseptic, non-ill-appearing.  History of ORIF right ankle in 2017.  Moderate soft tissue swelling with tenderness to lateral malleolus.  Normal muscular skeletal exam.  Neurovascularly intact.  Able to ambulate without difficulty.  Plain film negative for fracture or dislocation, intact hardware.  Low suspicion for septic joint.  No erythema, ecchymosis or warmth to bilateral lower extremity.  No calf swelling or tenderness.  Patient most likely with sprain or strain of right ankle.  Discussed follow-up with orthopedics.  Strict return precautions discussed.  Patient voiced understanding is agreeable for follow-up.    Final Clinical Impressions(s) / ED  Diagnoses   Final diagnoses:  Acute right ankle pain    ED Discharge Orders    None       Axel Frisk A, PA-C 09/04/18 1207    Melene Plan, DO 09/04/18 1235

## 2018-09-04 NOTE — Discharge Instructions (Addendum)
Evaluated today for right ankle pain.  Your x-ray did not show any evidence of fracture, dislocation or abnormal hardware.  It is possible you have a sprain or strain of your ligaments or tendons in your right ankle.  We have placed an Ace bandage for support.  I would suggest following up with your orthopedist for reevaluation.  Return to the ED for new or worsening symptoms such as:  Contact a doctor if: Your bruises or swelling are quickly getting worse. Your pain does not get better after you take medicine. Get help right away if: You cannot feel your toes or foot. Your toes or your foot looks blue. You have very bad pain that gets worse.

## 2018-09-04 NOTE — ED Notes (Signed)
ED Provider at bedside. 

## 2018-10-12 ENCOUNTER — Encounter (HOSPITAL_COMMUNITY): Payer: Self-pay

## 2018-10-12 ENCOUNTER — Inpatient Hospital Stay (HOSPITAL_COMMUNITY)
Admission: EM | Admit: 2018-10-12 | Discharge: 2018-10-17 | DRG: 193 | Disposition: A | Discharge status: Home / Self Care | Payer: 59 | Attending: Internal Medicine | Admitting: Internal Medicine

## 2018-10-12 ENCOUNTER — Other Ambulatory Visit: Payer: Self-pay

## 2018-10-12 ENCOUNTER — Emergency Department (HOSPITAL_COMMUNITY): Payer: 59

## 2018-10-12 DIAGNOSIS — E669 Obesity, unspecified: Secondary | ICD-10-CM | POA: Diagnosis present

## 2018-10-12 DIAGNOSIS — J123 Human metapneumovirus pneumonia: Secondary | ICD-10-CM | POA: Diagnosis not present

## 2018-10-12 DIAGNOSIS — J9601 Acute respiratory failure with hypoxia: Secondary | ICD-10-CM | POA: Diagnosis present

## 2018-10-12 DIAGNOSIS — Z9049 Acquired absence of other specified parts of digestive tract: Secondary | ICD-10-CM

## 2018-10-12 DIAGNOSIS — J181 Lobar pneumonia, unspecified organism: Secondary | ICD-10-CM

## 2018-10-12 DIAGNOSIS — D649 Anemia, unspecified: Secondary | ICD-10-CM | POA: Diagnosis present

## 2018-10-12 DIAGNOSIS — R0600 Dyspnea, unspecified: Secondary | ICD-10-CM | POA: Diagnosis present

## 2018-10-12 DIAGNOSIS — Z6836 Body mass index (BMI) 36.0-36.9, adult: Secondary | ICD-10-CM

## 2018-10-12 DIAGNOSIS — E876 Hypokalemia: Secondary | ICD-10-CM | POA: Diagnosis present

## 2018-10-12 DIAGNOSIS — Z66 Do not resuscitate: Secondary | ICD-10-CM | POA: Diagnosis present

## 2018-10-12 DIAGNOSIS — J211 Acute bronchiolitis due to human metapneumovirus: Secondary | ICD-10-CM | POA: Diagnosis present

## 2018-10-12 DIAGNOSIS — Z90710 Acquired absence of both cervix and uterus: Secondary | ICD-10-CM

## 2018-10-12 DIAGNOSIS — Z9884 Bariatric surgery status: Secondary | ICD-10-CM

## 2018-10-12 DIAGNOSIS — J189 Pneumonia, unspecified organism: Secondary | ICD-10-CM

## 2018-10-12 DIAGNOSIS — R0602 Shortness of breath: Secondary | ICD-10-CM

## 2018-10-12 HISTORY — DX: Pneumonia, unspecified organism: J18.9

## 2018-10-12 HISTORY — DX: Personal history of other diseases of the digestive system: Z87.19

## 2018-10-12 HISTORY — DX: Personal history of other medical treatment: Z92.89

## 2018-10-12 LAB — BASIC METABOLIC PANEL
Anion gap: 11 (ref 5–15)
BUN: 9 mg/dL (ref 8–23)
CALCIUM: 8.4 mg/dL — AB (ref 8.9–10.3)
CO2: 20 mmol/L — ABNORMAL LOW (ref 22–32)
Chloride: 109 mmol/L (ref 98–111)
Creatinine, Ser: 0.69 mg/dL (ref 0.44–1.00)
GFR calc Af Amer: >60 mL/min (ref >60)
GLUCOSE: 99 mg/dL (ref 70–99)
Potassium: 3.4 mmol/L — ABNORMAL LOW (ref 3.5–5.1)
SODIUM: 140 mmol/L (ref 135–145)

## 2018-10-12 LAB — CBC
HCT: 37.5 % (ref 36.0–46.0)
HEMOGLOBIN: 11 g/dL — AB (ref 12.0–15.0)
MCH: 27 pg (ref 26.0–34.0)
MCHC: 29.3 g/dL — AB (ref 30.0–36.0)
MCV: 92.1 fL (ref 80.0–100.0)
PLATELETS: 223 10*3/uL (ref 150–400)
RBC: 4.07 MIL/uL (ref 3.87–5.11)
RDW: 20.8 % — AB (ref 11.5–15.5)
WBC: 6.4 10*3/uL (ref 4.0–10.5)
nRBC: 0.3 % — ABNORMAL HIGH (ref 0.0–0.2)

## 2018-10-12 LAB — EXPECTORATED SPUTUM ASSESSMENT W GRAM STAIN, RFLX TO RESP C: Special Requests: NORMAL

## 2018-10-12 LAB — I-STAT TROPONIN, ED: TROPONIN I, POC: 0 ng/mL (ref 0.00–0.08)

## 2018-10-12 LAB — PROCALCITONIN: Procalcitonin: <0.1 ng/mL

## 2018-10-12 MED ORDER — SODIUM CHLORIDE 0.9 % IV SOLN
2.0000 g | Freq: Once | INTRAVENOUS | Status: AC
  Administered 2018-10-12: 2 g via INTRAVENOUS
  Filled 2018-10-12: qty 20

## 2018-10-12 MED ORDER — SODIUM CHLORIDE 0.9 % IV SOLN
INTRAVENOUS | Status: DC
  Administered 2018-10-12 – 2018-10-14 (×3): via INTRAVENOUS

## 2018-10-12 MED ORDER — DOXYCYCLINE MONOHYDRATE 100 MG PO CAPS
100.00 | ORAL_CAPSULE | ORAL | Status: DC
Start: 2018-10-11 — End: 2018-10-12

## 2018-10-12 MED ORDER — SODIUM CHLORIDE 0.9 % IV SOLN
500.0000 mg | Freq: Once | INTRAVENOUS | Status: AC
  Administered 2018-10-12: 500 mg via INTRAVENOUS
  Filled 2018-10-12: qty 500

## 2018-10-12 MED ORDER — ACETAMINOPHEN 325 MG PO TABS
650.0000 mg | ORAL_TABLET | Freq: Four times a day (QID) | ORAL | Status: DC | PRN
  Administered 2018-10-12 – 2018-10-16 (×7): 650 mg via ORAL
  Filled 2018-10-12 (×7): qty 2

## 2018-10-12 MED ORDER — LACTATED RINGERS IV SOLN
100.00 | INTRAVENOUS | Status: DC
Start: ? — End: 2018-10-12

## 2018-10-12 MED ORDER — ENOXAPARIN SODIUM 40 MG/0.4ML ~~LOC~~ SOLN
40.0000 mg | SUBCUTANEOUS | Status: DC
  Administered 2018-10-13 – 2018-10-15 (×2): 40 mg via SUBCUTANEOUS
  Filled 2018-10-12 (×4): qty 0.4

## 2018-10-12 MED ORDER — HYDROCODONE-ACETAMINOPHEN 5-325 MG PO TABS
1.00 | ORAL_TABLET | ORAL | Status: DC
Start: ? — End: 2018-10-12

## 2018-10-12 MED ORDER — GENERIC EXTERNAL MEDICATION
30.00 | Status: DC
Start: 2018-10-12 — End: 2018-10-12

## 2018-10-12 MED ORDER — IPRATROPIUM-ALBUTEROL 0.5-2.5 (3) MG/3ML IN SOLN
3.00 | RESPIRATORY_TRACT | Status: DC
Start: 2018-10-11 — End: 2018-10-12

## 2018-10-12 MED ORDER — SODIUM CHLORIDE 0.9 % IV SOLN
500.0000 mg | Freq: Once | INTRAVENOUS | Status: AC
  Administered 2018-10-13: 500 mg via INTRAVENOUS
  Filled 2018-10-12: qty 500

## 2018-10-12 MED ORDER — IPRATROPIUM-ALBUTEROL 0.5-2.5 (3) MG/3ML IN SOLN
3.0000 mL | Freq: Once | RESPIRATORY_TRACT | Status: AC
  Administered 2018-10-12: 3 mL via RESPIRATORY_TRACT
  Filled 2018-10-12: qty 3

## 2018-10-12 MED ORDER — ONDANSETRON HCL 4 MG/2ML IJ SOLN
4.0000 mg | Freq: Once | INTRAMUSCULAR | Status: AC
  Administered 2018-10-12: 4 mg via INTRAVENOUS
  Filled 2018-10-12: qty 2

## 2018-10-12 MED ORDER — GENERIC EXTERNAL MEDICATION
650.00 | Status: DC
Start: ? — End: 2018-10-12

## 2018-10-12 MED ORDER — ALUM & MAG HYDROXIDE-SIMETH 200-200-20 MG/5ML PO SUSP
30.00 | ORAL | Status: DC
Start: ? — End: 2018-10-12

## 2018-10-12 MED ORDER — GENERIC EXTERNAL MEDICATION
10.00 | Status: DC
Start: ? — End: 2018-10-12

## 2018-10-12 MED ORDER — GENERIC EXTERNAL MEDICATION
2.00 | Status: DC
Start: 2018-10-12 — End: 2018-10-12

## 2018-10-12 MED ORDER — SODIUM CHLORIDE 0.9 % IV SOLN
10.00 | INTRAVENOUS | Status: DC
Start: ? — End: 2018-10-12

## 2018-10-12 MED ORDER — ENOXAPARIN SODIUM 40 MG/0.4ML ~~LOC~~ SOLN
40.00 | SUBCUTANEOUS | Status: DC
Start: 2018-10-11 — End: 2018-10-12

## 2018-10-12 MED ORDER — GUAIFENESIN 100 MG/5ML PO LIQD
200.00 | ORAL | Status: DC
Start: ? — End: 2018-10-12

## 2018-10-12 MED ORDER — ALBUTEROL SULFATE (2.5 MG/3ML) 0.083% IN NEBU
2.50 | INHALATION_SOLUTION | RESPIRATORY_TRACT | Status: DC
Start: ? — End: 2018-10-12

## 2018-10-12 MED ORDER — ACETAMINOPHEN 325 MG PO TABS
650.00 | ORAL_TABLET | ORAL | Status: DC
Start: ? — End: 2018-10-12

## 2018-10-12 MED ORDER — SODIUM CHLORIDE 0.9 % IV BOLUS
1000.0000 mL | Freq: Once | INTRAVENOUS | Status: AC
  Administered 2018-10-12: 1000 mL via INTRAVENOUS

## 2018-10-12 MED ORDER — GENERIC EXTERNAL MEDICATION
4.00 | Status: DC
Start: ? — End: 2018-10-12

## 2018-10-12 MED ORDER — ALBUTEROL SULFATE (2.5 MG/3ML) 0.083% IN NEBU
2.5000 mg | INHALATION_SOLUTION | RESPIRATORY_TRACT | Status: DC | PRN
  Administered 2018-10-12 – 2018-10-17 (×7): 2.5 mg via RESPIRATORY_TRACT
  Filled 2018-10-12 (×7): qty 3

## 2018-10-12 MED ORDER — SODIUM CHLORIDE 0.9 % IV SOLN
2.0000 g | Freq: Once | INTRAVENOUS | Status: AC
  Administered 2018-10-13: 2 g via INTRAVENOUS
  Filled 2018-10-12: qty 20

## 2018-10-12 NOTE — H&P (Signed)
History and Physical    Rachel RuddKiran Christopoulos ZOX:[id number] DOB: 1956-02-07 DOA: 10/12/2018  PCP: August AlbinoWray, Walter H, MD Consultants:  None Patient coming from:  Home - lives alone; NOK: Daughter, Dr. Sheliah PlaneMolly Benedum (FP physician, [place]), [phone]  Chief Complaint: SOB  HPI: Rachel Cole is a 62 y.o. female with medical history significant of obesity s/p gastric bypass surgery and ADHD who was admitted from 12/1-2 at [facility] for multifocal PNA.    She started getting sick on Friday.  She works as a Psychologist, sport and exerciseparalegal and church organist - she went to church Sunday but felt horrible and she was admitted with PNA at [facility].  She begged to get out yesterday to go back to work this AM.  This AM at work, she got really sick and it was too soon.  She was having SOB "and stridor."  Cough is productive of phlegm since Friday with some blood today.  T101.9 Sunday, none since.  No sick contacts.  +wheezing.  Now she is having diarrhea today - 3 stools in about the last hour; she reports chronic diarrhea since her gastric bypass procedure and this is not unusual for her.   ED Course:   Multifocal PNA.  She begged for early d/c and went back to work.  Continued with SOB, near syncope.  Now coughing up blood, somewhat dramatic but likely needs observation.  Discharged on prednisone, Omnicef, and doxy as well as albuterol MDI but did not get these filled.  Review of Systems: As per HPI; otherwise review of systems reviewed and negative.   Ambulatory Status:  Ambulates without assistance  Past Medical History:  Diagnosis Date  . ADHD   . Mitral valve prolapse     Past Surgical History:  Procedure Laterality Date  . ANKLE FRACTURE SURGERY Right 07/01/2016  . GASTRIC BYPASS  2005    Social History   Socioeconomic History  . Marital status: Single    Spouse name: Not on file  . Number of children: Not on file  . Years of education: Not on file  . Highest education level: Not on file  Occupational  History  . Occupation: IT consultantparalegal  Social Needs  . Financial resource strain: Not on file  . Food insecurity:    Worry: Not on file    Inability: Not on file  . Transportation needs:    Medical: Not on file    Non-medical: Not on file  Tobacco Use  . Smoking status: Never Smoker  . Smokeless tobacco: Never Used  Substance and Sexual Activity  . Alcohol use: Yes  . Drug use: No  . Sexual activity: Not on file  Lifestyle  . Physical activity:    Days per week: Not on file    Minutes per session: Not on file  . Stress: Not on file  Relationships  . Social connections:    Talks on phone: Not on file    Gets together: Not on file    Attends religious service: Not on file    Active member of club or organization: Not on file    Attends meetings of clubs or organizations: Not on file    Relationship status: Not on file  . Intimate partner violence:    Fear of current or ex partner: Not on file    Emotionally abused: Not on file    Physically abused: Not on file    Forced sexual activity: Not on file  Other Topics Concern  . Not on file  Social History Narrative  . Not on file    Allergies  Allergen Reactions  . Bee Venom Anaphylaxis  . Cat Hair Extract Anaphylaxis  . Ivp Dye [Iodinated Diagnostic Agents] Shortness Of Breath  . Penicillins Anaphylaxis    Has patient had a PCN reaction causing immediate rash, facial/tongue/throat swelling, SOB or lightheadedness with hypotension: Yes Has patient had a PCN reaction causing severe rash involving mucus membranes or skin necrosis: Unk Has patient had a PCN reaction that required hospitalization: Unk Has patient had a PCN reaction occurring within the last 10 years: No If all of the above answers are "NO", then may proceed with Cephalosporin use.     History reviewed. No pertinent family history.  Prior to Admission medications   Medication Sig Start Date End Date Taking? Authorizing Provider  amphetamine-dextroamphetamine  (ADDERALL) 20 MG tablet Take 20 mg by mouth 3 (three) times daily.    [provider]  benzonatate (TESSALON) 100 MG capsule Take 1 capsule (100 mg total) by mouth 3 (three) times daily as needed for cough. 04/05/18   Antony Madura, PA-C  ondansetron (ZOFRAN) 4 MG tablet Take 1 tablet (4 mg total) by mouth every 8 (eight) hours as needed for nausea. 08/12/17   Dione Booze, MD    Physical Exam: Vitals:   10/12/18 1237 10/12/18 1239 10/12/18 1613  BP: (!) 155/90  127/78  Pulse: 91  91  Resp: (!) 25  20  Temp: 98.6 F (37 C)  98.4 F (36.9 C)  TempSrc: Oral  Oral  SpO2: 99%  97%  Weight:  91.2 kg   Height:  5\' 2"  (1.575 m)      General:  Appears calm and comfortable and is NAD; she does appear to be fatigued Eyes:  PERRL, EOMI, normal lids, iris ENT:  grossly normal hearing, lips & tongue, mmm; appropriate dentition Neck:  no LAD, masses or thyromegaly; no carotid bruits Cardiovascular:  RRR, no m/r/g. No LE edema.  Respiratory:   Diffuse rhonchi but particularly in B LL with mildly increased respiratory effort. Abdomen:  soft, NT, ND, NABS Back:   normal alignment, no CVAT Skin:  no rash or induration seen on limited exam Musculoskeletal:  grossly normal tone BUE/BLE, good ROM, no bony abnormality Psychiatric:  grossly normal mood and affect, speech fluent and appropriate, AOx3 Neurologic:  CN 2-12 grossly intact, moves all extremities in coordinated fashion, sensation intact    Radiological Exams on Admission: Dg Chest Port 1 View  Result Date: 10/12/2018 CLINICAL DATA:  New leading nose pneumonia prematurely discharge. EXAM: PORTABLE CHEST 1 VIEW COMPARISON:  04/05/2018 CT, 04/04/2018 CXR FINDINGS: The heart size and mediastinal contours are within normal limits. The thoracic aorta is slightly atherosclerotic and uncoiled in appearance. No aneurysm. No effusion or pneumothorax. Subtle pulmonary opacity in the right upper lobe consistent with pneumonia. This is  superimposed on mild chronic interstitial prominence. The visualized skeletal structures are unremarkable. IMPRESSION: Small focus of airspace disease in the right upper lobe consistent with pneumonia. Electronically Signed   By: Tollie Eth M.D.   On: 10/12/2018 13:43    EKG: Independently reviewed.  NSR with rate 92; nonspecific ST changes with no evidence of acute ischemia   Labs on Admission: I have personally reviewed the available labs and imaging studies at the time of the admission.  Pertinent labs:   K+ 3.4 CO2 20 Troponin 0.00 WBC 6.4   Assessment/Plan Active Problems:   Right upper lobe pneumonia (HCC)   Obesity (  BMI 35.0-39.9 without comorbidity)   Multilobar PNA -Patient with multilobar PNA diagnosed at OSH Sunday, presenting today with persistent symptoms -CXR today with RUL PNA but exam appears to indicate persistent multilobar PNA -CURB-65 score is 0 - it would be reasonable to discharge the patient to home had she not just been discharged from the hospital; will observe overnight. -Pneumonia Severity Index (PSI) is Class 2, <1% mortality. -Will continue Azithromycin 500 mg daily AND Rocephin due to no risk factors for MDR cause - of note, she does have a reported anaphylactic allergy to PCN but received Rocephin in the ER without difficulty and so it reasonable to continue.  -She already has rx for Omnicef and Doxy that she has not yet filled; these should be more than sufficient at the time of d/c. -NS @ 75cc/hr -Fever control -Repeat CBC in am -Sputum cultures -Blood culture x1 - has already received abx since Sunday so probably not overly effective at this time -Strep pneumo testing -Will order lower respiratory tract procalcitonin level.  Antibiotics would not be indicated for PCT <0.1 and probably should not be used for < 0.25.  >0.5 indicates infection and >>0.5 indicates more serious disease.  As the procalcitonin level normalizes, it will be reasonable to  consider de-escalation of antibiotic coverage. -albuterol PRN  Obesity -remote h/o gastric bypass -Her BMI is almost 37 -Needs ongoing efforts at diet and exercise -She has chronic intermittent diarrhea associated with her h/o bariatric surgery; if diarrhea worsens compared to usual could consider C diff testing given recent abx.   DVT prophylaxis:  Lovenox  Code Status:  DNR - confirmed with patient Family Communication: None present Disposition Plan:  Home once clinically improved Consults called: None  Admission status: It is my clinical opinion that referral for OBSERVATION is reasonable and necessary in this patient based on the above information provided. The aforementioned taken together are felt to place the patient at high risk for further clinical deterioration. However it is anticipated that the patient may be medically stable for discharge from the hospital within 24 to 48 hours.    Jonah Blue MD [facility]  If note is complete, please contact covering daytime or nighttime physician. [email or web address] Password [email or web address]  10/12/2018, 4:20 PM

## 2018-10-12 NOTE — ED Triage Notes (Signed)
Pt has newly diagnosed PNA and was prematurely discharged yesterday from [facility] in [place].  Pt was at her new job today and her boss drove her to the ED for SOB.  Pt has prescriptions that she has not filled yet.  Including cefdinir, albuterol, doxy and prednisone.  Pt is audibly wheezing from across the room.  Pt's breathing slowed with reassurance and o2 sat on RA is 98-100%

## 2018-10-12 NOTE — ED Provider Notes (Signed)
[facility] Emergency Department Provider Note MRN:  [id number]  Arrival date & time: 10/12/18     Chief Complaint   Shortness of Breath   History of Present Illness   Rachel Cole is a 62 y.o. year-old female with a history of ADHD, mitral valve prolapse presenting to the ED with chief complaint of shortness of breath.  Patient began experiencing severe fatigue and malaise 6 days ago, accompanied by cough and fever.  Was brought to [facility] emergency department, diagnosed with multifocal pneumonia and admitted to the hospital.  Patient works as a Human resources officer and persisted that she be discharged early.  Was discharged yesterday, tried to go to work today, had a very difficult time at work with shortness of breath, had a near syncopal episode at work, was brought here by her boss.  Continued low energy, cough, dull frontal headache, shortness of breath that is worse with exertion.  Review of Systems  A complete 10 system review of systems was obtained and all systems are negative except as noted in the HPI and PMH.   Patient's Health History    Past Medical History:  Diagnosis Date  . ADHD   . History of blood transfusion 2007   "low HgB"  . History of hiatal hernia    "before gastric bypass; fixed it w/that OR" (10/12/2018)  . Mitral valve prolapse   . Mitral valve prolapse   . Pneumonia 10/12/2018   "1st time" (10/12/2018)    Past Surgical History:  Procedure Laterality Date  . ANKLE FRACTURE SURGERY Right 07/01/2016  . CESAREAN SECTION  1987; 1990  . CHOLECYSTECTOMY  2005  . DE QUERVAIN'S RELEASE Bilateral   . FRACTURE SURGERY    . ROUX-EN-Y GASTRIC BYPASS  2005   "et repaired hiatal hernia" (10/12/2018)  . TIBIA FRACTURE SURGERY Left 1964  . TONSILLECTOMY    . TUBAL LIGATION    . VAGINAL HYSTERECTOMY      History reviewed. No pertinent family history.  Social History   Socioeconomic History  . Marital status: Divorced    Spouse name: Not  on file  . Number of children: Not on file  . Years of education: Not on file  . Highest education level: Not on file  Occupational History  . Occupation: IT consultant  Social Needs  . Financial resource strain: Not on file  . Food insecurity:    Worry: Not on file    Inability: Not on file  . Transportation needs:    Medical: Not on file    Non-medical: Not on file  Tobacco Use  . Smoking status: Never Smoker  . Smokeless tobacco: Never Used  Substance and Sexual Activity  . Alcohol use: Yes    Comment: 10/12/2018 "might have 6 drinks/year; if that"  . Drug use: Never  . Sexual activity: Not Currently  Lifestyle  . Physical activity:    Days per week: Not on file    Minutes per session: Not on file  . Stress: Not on file  Relationships  . Social connections:    Talks on phone: Not on file    Gets together: Not on file    Attends religious service: Not on file    Active member of club or organization: Not on file    Attends meetings of clubs or organizations: Not on file    Relationship status: Not on file  . Intimate partner violence:    Fear of current or ex partner: Not on file  Emotionally abused: Not on file    Physically abused: Not on file    Forced sexual activity: Not on file  Other Topics Concern  . Not on file  Social History Narrative  . Not on file     Physical Exam  Vital Signs and Nursing Notes reviewed Vitals:   10/12/18 1613 10/12/18 1717  BP: 127/78 129/81  Pulse: 91 86  Resp: 20 16  Temp: 98.4 F (36.9 C) 98.7 F (37.1 C)  SpO2: 97% 99%    CONSTITUTIONAL: Well-appearing, NAD NEURO:  Alert and oriented x 3, no focal deficits EYES:  eyes equal and reactive ENT/NECK:  no LAD, no JVD CARDIO: Regular rate, well-perfused, normal S1 and S2 PULM:  CTAB no wheezing or rhonchi GI/GU:  normal bowel sounds, non-distended, non-tender MSK/SPINE:  No gross deformities, no edema SKIN:  no rash, atraumatic PSYCH:  Appropriate speech and  behavior  Diagnostic and Interventional Summary    EKG Interpretation  Date/Time:  Wednesday October 12 2018 12:42:07 EST Ventricular Rate:  92 PR Interval:  118 QRS Duration: 74 QT Interval:  378 QTC Calculation: 467 R Axis:   27 Text Interpretation:  Normal sinus rhythm Possible Anterior infarct , age undetermined Abnormal ECG Confirmed by Kennis Carina [phone]) on 10/12/2018 12:55:57 PM      Labs Reviewed  CBC - Abnormal; Notable for the following components:      Result Value   Hemoglobin 11.0 (*)    MCHC 29.3 (*)    RDW 20.8 (*)    nRBC 0.3 (*)    All other components within normal limits  BASIC METABOLIC PANEL - Abnormal; Notable for the following components:   Potassium 3.4 (*)    CO2 20 (*)    Calcium 8.4 (*)    All other components within normal limits  CULTURE, BLOOD (SINGLE)  EXPECTORATED SPUTUM ASSESSMENT W REFEX TO RESP CULTURE  GRAM STAIN  HIV ANTIBODY (ROUTINE TESTING W REFLEX)  STREP PNEUMONIAE URINARY ANTIGEN  PROCALCITONIN  BASIC METABOLIC PANEL  CBC WITH DIFFERENTIAL/PLATELET  I-STAT TROPONIN, ED    DG Chest Port 1 View  Final Result      Medications  azithromycin (ZITHROMAX) 500 mg in sodium chloride 0.9 % 250 mL IVPB (has no administration in time range)  cefTRIAXone (ROCEPHIN) 2 g in sodium chloride 0.9 % 100 mL IVPB (has no administration in time range)  enoxaparin (LOVENOX) injection 40 mg (has no administration in time range)  0.9 %  sodium chloride infusion ( Intravenous New Bag/Given 10/12/18 1831)  albuterol (PROVENTIL) (2.5 MG/3ML) 0.083% nebulizer solution 2.5 mg (has no administration in time range)  ipratropium-albuterol (DUONEB) 0.5-2.5 (3) MG/3ML nebulizer solution 3 mL (3 mLs Nebulization Given 10/12/18 1247)  sodium chloride 0.9 % bolus 1,000 mL (0 mLs Intravenous Stopped 10/12/18 1612)  cefTRIAXone (ROCEPHIN) 2 g in sodium chloride 0.9 % 100 mL IVPB (0 g Intravenous Stopped 10/12/18 1502)  azithromycin (ZITHROMAX) 500 mg in sodium  chloride 0.9 % 250 mL IVPB (0 mg Intravenous Stopped 10/12/18 1622)  ondansetron (ZOFRAN) injection 4 mg (4 mg Intravenous Given 10/12/18 1518)     Procedures Critical Care  ED Course and Medical Decision Making  I have reviewed the triage vital signs and the nursing notes.  Pertinent labs & imaging results that were available during my care of the patient were reviewed by me and considered in my medical decision making (see below for details).  Presyncope and continued shortness of breath today in the 62 year old female with  a recent diagnosis of multifocal pneumonia.  Did not take any antibiotics today, will provide IV antibiotics.  Work-up pending, anticipating admission.  Admitted to hospitalist service for further care and evaluation, accepted by Dr. Jonah BlueJennifer Yates.  Elmer SowMichael M. Pilar PlateBero, MD [facility] [email or web address]  Final Clinical Impressions(s) / ED Diagnoses     ICD-10-CM   1. Community acquired pneumonia, unspecified laterality J18.9   2. SOB (shortness of breath) R06.02 [facility]    [facility]    ED Discharge Orders    None         Sabas SousBero, Kentrell Guettler M, MD 10/12/18 [phone]

## 2018-10-13 DIAGNOSIS — J123 Human metapneumovirus pneumonia: Principal | ICD-10-CM

## 2018-10-13 DIAGNOSIS — E669 Obesity, unspecified: Secondary | ICD-10-CM | POA: Diagnosis not present

## 2018-10-13 DIAGNOSIS — J181 Lobar pneumonia, unspecified organism: Secondary | ICD-10-CM | POA: Diagnosis not present

## 2018-10-13 LAB — CBC WITH DIFFERENTIAL/PLATELET
Abs Immature Granulocytes: 0.1 10*3/uL — ABNORMAL HIGH (ref 0.00–0.07)
BASOS ABS: 0 10*3/uL (ref 0.0–0.1)
Basophils Relative: 0 %
Eosinophils Absolute: 0.1 10*3/uL (ref 0.0–0.5)
Eosinophils Relative: 1 %
HCT: 32.2 % — ABNORMAL LOW (ref 36.0–46.0)
Hemoglobin: 9.4 g/dL — ABNORMAL LOW (ref 12.0–15.0)
Immature Granulocytes: 2 %
Lymphocytes Relative: 43 %
Lymphs Abs: 2.3 10*3/uL (ref 0.7–4.0)
MCH: 26.9 pg (ref 26.0–34.0)
MCHC: 29.2 g/dL — ABNORMAL LOW (ref 30.0–36.0)
MCV: 92 fL (ref 80.0–100.0)
Monocytes Absolute: 0.6 10*3/uL (ref 0.1–1.0)
Monocytes Relative: 10 %
Neutro Abs: 2.3 10*3/uL (ref 1.7–7.7)
Neutrophils Relative %: 44 %
Platelets: 207 10*3/uL (ref 150–400)
RBC: 3.5 MIL/uL — ABNORMAL LOW (ref 3.87–5.11)
RDW: 20.7 % — ABNORMAL HIGH (ref 11.5–15.5)
WBC: 5.3 10*3/uL (ref 4.0–10.5)
nRBC: 0.4 % — ABNORMAL HIGH (ref 0.0–0.2)

## 2018-10-13 LAB — BASIC METABOLIC PANEL
Anion gap: 11 (ref 5–15)
BUN: 7 mg/dL — ABNORMAL LOW (ref 8–23)
CO2: 20 mmol/L — ABNORMAL LOW (ref 22–32)
Calcium: 7.3 mg/dL — ABNORMAL LOW (ref 8.9–10.3)
Chloride: 109 mmol/L (ref 98–111)
Creatinine, Ser: 0.63 mg/dL (ref 0.44–1.00)
GFR calc non Af Amer: >60 mL/min (ref >60)
Glucose, Bld: 92 mg/dL (ref 70–99)
POTASSIUM: 3.3 mmol/L — AB (ref 3.5–5.1)
Sodium: 140 mmol/L (ref 135–145)

## 2018-10-13 LAB — HIV ANTIBODY (ROUTINE TESTING W REFLEX): HIV Screen 4th Generation wRfx: NONREACTIVE

## 2018-10-13 LAB — STREP PNEUMONIAE URINARY ANTIGEN: Strep Pneumo Urinary Antigen: NEGATIVE

## 2018-10-13 MED ORDER — GUAIFENESIN ER 600 MG PO TB12
600.0000 mg | ORAL_TABLET | Freq: Two times a day (BID) | ORAL | Status: DC
  Administered 2018-10-13 – 2018-10-14 (×3): 600 mg via ORAL
  Filled 2018-10-13 (×3): qty 1

## 2018-10-13 MED ORDER — IPRATROPIUM-ALBUTEROL 0.5-2.5 (3) MG/3ML IN SOLN
3.0000 mL | Freq: Four times a day (QID) | RESPIRATORY_TRACT | Status: DC
  Administered 2018-10-13 – 2018-10-14 (×6): 3 mL via RESPIRATORY_TRACT
  Filled 2018-10-13 (×6): qty 3

## 2018-10-13 MED ORDER — ONDANSETRON HCL 4 MG/2ML IJ SOLN
4.0000 mg | Freq: Four times a day (QID) | INTRAMUSCULAR | Status: DC | PRN
  Administered 2018-10-13 – 2018-10-15 (×4): 4 mg via INTRAVENOUS
  Filled 2018-10-13 (×5): qty 2

## 2018-10-13 MED ORDER — SODIUM CHLORIDE 0.9 % IV SOLN
100.0000 mg | Freq: Two times a day (BID) | INTRAVENOUS | Status: AC
  Administered 2018-10-13 – 2018-10-15 (×5): 100 mg via INTRAVENOUS
  Filled 2018-10-13 (×6): qty 100

## 2018-10-13 NOTE — Progress Notes (Signed)
Pt ambulated with SpO2 at 97% RA

## 2018-10-13 NOTE — Progress Notes (Signed)
Pt had nausea, burining on the soles of her feet and epigastric pain. Possible reaction to Azithromycin, which was stopped. Dr Benjamine MolaVann notified.

## 2018-10-13 NOTE — Progress Notes (Signed)
Progress Note    Rachel Cole  ZOX:[id number] DOB: Nov 13, 1955  DOA: 10/12/2018 PCP: August Albino, MD    Brief Narrative:     Medical records reviewed and are as summarized below:  Rachel Cole is an 62 y.o. female with medical history significant of obesity s/p gastric bypass surgery and ADHD who was admitted from 12/1-2 at [facility] for multifocal PNA.    She started getting sick on Friday.  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  She begged to get out yesterday to go back to work this AM.  This AM at work, she got really sick and it was too soon.  She was having SOB "and stridor."  Cough is productive of phlegm since Friday with some blood today.  T101.9 Sunday, none since.  No sick contacts.  +wheezing.  Now she is having diarrhea today - 3 stools in about the last hour; she reports chronic diarrhea since her gastric bypass procedure and this is not unusual for her.  Assessment/Plan:   Active Problems:   Right upper lobe pneumonia (HCC)   Obesity (BMI 35.0-39.9 without comorbidity)  pna due to human metapneumovirus -continue IV abx as patient not eating well -IVF again as not eating much -mucinex -PRN and scheduled nebs -outpatient xray to ensure resolution -pulm toilet  Obesity Body mass index is 36.76 kg/m.  Anemia -dilutional -outpatient follow up  Hypokalemia -replete   Family Communication/Anticipated D/C date and plan/Code Status   DVT prophylaxis: Lovenox ordered. Code Status: Full Code.  Family Communication:  Disposition Plan: not eating much and still very dyspneic needs to be monitored closely and if not improved in AM need CT Scan   Medical Consultants:      Subjective:   "I just do not feel well" Having trouble breathing  Objective:    Vitals:   10/12/18 1717 10/12/18 2330 10/13/18 0633 10/13/18 0756  BP: 129/81 123/76 130/74     Pulse: 86 76 70   Resp: 16 18 18    Temp: 98.7 F (37.1 C) 98.2 F (36.8 C) 98.2 F (36.8 C)   TempSrc: Oral Oral Oral   SpO2: 99% 95% 94% 95%  Weight:      Height:        Intake/Output Summary (Last 24 hours) at 10/13/2018 1419 Last data filed at 10/13/2018 0400 Gross per 24 hour  Intake 2060.48 ml  Output -  Net 2060.48 ml   Filed Weights   10/12/18 1239  Weight: 91.2 kg    Exam: In bed, ill appearing rrr Coarse breath sounds throughout-- did not clear with coughing Min LE edema A+Ox3  Data Reviewed:   I have personally reviewed following labs and imaging studies:  Labs: Labs show the following:   Basic Metabolic Panel: Recent Labs  Lab 10/12/18 1311 10/13/18 0353  NA 140 140  K 3.4* 3.3*  CL 109 109  CO2 20* 20*  GLUCOSE 99 92  BUN 9 7*  CREATININE 0.69 0.63  CALCIUM 8.4* 7.3*   GFR Estimated Creatinine Clearance: 76.5 mL/min (by C-G formula based on SCr of 0.63 mg/dL). Liver Function Tests: No results for input(s): AST, ALT, ALKPHOS, BILITOT, PROT, ALBUMIN in the last 168 hours. No results for input(s): LIPASE, AMYLASE in the last 168 hours. No results for input(s): AMMONIA in the last 168 hours. Coagulation profile No results for input(s): INR, PROTIME in  the last 168 hours.  CBC: Recent Labs  Lab 10/12/18 1311 10/13/18 0353  WBC 6.4 5.3  NEUTROABS  --  2.3  HGB 11.0* 9.4*  HCT 37.5 32.2*  MCV 92.1 92.0  PLT 223 207   Cardiac Enzymes: No results for input(s): CKTOTAL, CKMB, CKMBINDEX, TROPONINI in the last 168 hours. BNP (last 3 results) No results for input(s): PROBNP in the last 8760 hours. CBG: No results for input(s): GLUCAP in the last 168 hours. D-Dimer: No results for input(s): DDIMER in the last 72 hours. Hgb A1c: No results for input(s): HGBA1C in the last 72 hours. Lipid Profile: No results for input(s): CHOL, HDL, LDLCALC, TRIG, CHOLHDL, LDLDIRECT in the last 72 hours. Thyroid function studies: No results for  input(s): TSH, T4TOTAL, T3FREE, THYROIDAB in the last 72 hours.  Invalid input(s): FREET3 Anemia work up: No results for input(s): VITAMINB12, FOLATE, FERRITIN, TIBC, IRON, RETICCTPCT in the last 72 hours. Sepsis Labs: Recent Labs  Lab 10/12/18 1311 10/12/18 1654 10/13/18 0353  PROCALCITON  --  <0.10  --   WBC 6.4  --  5.3    Microbiology Recent Results (from the past 240 hour(s))  Culture, blood (single)     Status: None (Preliminary result)   Collection Time: 10/12/18  5:00 PM  Result Value Ref Range Status   Specimen Description BLOOD LEFT WRIST  Final   Special Requests   Final    BOTTLES DRAWN AEROBIC AND ANAEROBIC Blood Culture adequate volume   Culture   Final    NO GROWTH < 24 HOURS Performed at [facility], [address], [place], [place] [address]    Report Status PENDING  Incomplete  Culture, sputum-assessment     Status: None   Collection Time: 10/12/18  8:52 PM  Result Value Ref Range Status   Specimen Description EXPECTORATED SPUTUM  Final   Special Requests   Final    Normal Performed at [facility], [address], [place], [place] [address]    Sputum evaluation THIS SPECIMEN IS ACCEPTABLE FOR SPUTUM CULTURE  Final   Report Status 10/12/2018 FINAL  Final  Culture, respiratory     Status: None (Preliminary result)   Collection Time: 10/12/18  8:52 PM  Result Value Ref Range Status   Specimen Description EXPECTORATED SPUTUM  Final   Special Requests Normal Reflexed from [id number]  Final   Gram Stain   Final    ABUNDANT WBC PRESENT, PREDOMINANTLY PMN RARE SQUAMOUS EPITHELIAL CELLS PRESENT RARE GRAM POSITIVE COCCI RARE YEAST    Culture   Final    NO GROWTH < 12 HOURS Performed at [facility], [address] [place], [place] [address]    Report Status PENDING  Incomplete    Procedures and diagnostic studies:  Dg Chest Port 1 View  Result Date: 10/12/2018 CLINICAL DATA:  New leading nose pneumonia prematurely discharge. EXAM:  PORTABLE CHEST 1 VIEW COMPARISON:  04/05/2018 CT, 04/04/2018 CXR FINDINGS: The heart size and mediastinal contours are within normal limits. The thoracic aorta is slightly atherosclerotic and uncoiled in appearance. No aneurysm. No effusion or pneumothorax. Subtle pulmonary opacity in the right upper lobe consistent with pneumonia. This is superimposed on mild chronic interstitial prominence. The visualized skeletal structures are unremarkable. IMPRESSION: Small focus of airspace disease in the right upper lobe consistent with pneumonia. Electronically Signed   By: Tollie Eth M.D.   On: 10/12/2018 13:43    Medications:   . enoxaparin (LOVENOX) injection  40 mg Subcutaneous Q24H   Continuous Infusions: . sodium chloride 75 mL/hr at 10/13/18 0802  . azithromycin    . cefTRIAXone (ROCEPHIN)  IV 2 g (10/13/18 1408)     LOS: 0 days   Joseph ArtJessica U Vann  [facility]   *Please refer to [email or web address], password [email or web address] to get updated schedule on who will round on this patient, as hospitalists switch teams weekly. If 7PM-7AM, please contact night-coverage at [email or web address], password [email or web address] for any overnight needs.  10/13/2018, 2:19 PM

## 2018-10-13 NOTE — Progress Notes (Signed)
Called pharm to follow up on doxycycline that has not yet been received. Caryn BeeKevin said he would send it up.

## 2018-10-14 DIAGNOSIS — J189 Pneumonia, unspecified organism: Secondary | ICD-10-CM

## 2018-10-14 DIAGNOSIS — E669 Obesity, unspecified: Secondary | ICD-10-CM | POA: Diagnosis not present

## 2018-10-14 DIAGNOSIS — E876 Hypokalemia: Secondary | ICD-10-CM

## 2018-10-14 LAB — BASIC METABOLIC PANEL
Anion gap: 9 (ref 5–15)
BUN: <5 mg/dL — ABNORMAL LOW (ref 8–23)
CO2: 19 mmol/L — ABNORMAL LOW (ref 22–32)
Calcium: 7.4 mg/dL — ABNORMAL LOW (ref 8.9–10.3)
Chloride: 112 mmol/L — ABNORMAL HIGH (ref 98–111)
Creatinine, Ser: 0.74 mg/dL (ref 0.44–1.00)
GFR calc non Af Amer: >60 mL/min (ref >60)
Glucose, Bld: 100 mg/dL — ABNORMAL HIGH (ref 70–99)
Potassium: 3.1 mmol/L — ABNORMAL LOW (ref 3.5–5.1)
Sodium: 140 mmol/L (ref 135–145)

## 2018-10-14 LAB — CBC
HCT: 32.5 % — ABNORMAL LOW (ref 36.0–46.0)
Hemoglobin: 9.5 g/dL — ABNORMAL LOW (ref 12.0–15.0)
MCH: 26.7 pg (ref 26.0–34.0)
MCHC: 29.2 g/dL — ABNORMAL LOW (ref 30.0–36.0)
MCV: 91.3 fL (ref 80.0–100.0)
Platelets: 239 10*3/uL (ref 150–400)
RBC: 3.56 MIL/uL — ABNORMAL LOW (ref 3.87–5.11)
RDW: 20.7 % — ABNORMAL HIGH (ref 11.5–15.5)
WBC: 5.4 10*3/uL (ref 4.0–10.5)
nRBC: 0.6 % — ABNORMAL HIGH (ref 0.0–0.2)

## 2018-10-14 MED ORDER — RISAQUAD PO CAPS
1.0000 | ORAL_CAPSULE | Freq: Every day | ORAL | Status: DC
  Administered 2018-10-14 – 2018-10-17 (×4): 1 via ORAL
  Filled 2018-10-14 (×4): qty 1

## 2018-10-14 MED ORDER — POTASSIUM CHLORIDE IN NACL 20-0.9 MEQ/L-% IV SOLN
INTRAVENOUS | Status: DC
  Administered 2018-10-14 (×2): via INTRAVENOUS
  Filled 2018-10-14 (×2): qty 1000

## 2018-10-14 MED ORDER — PREDNISONE 20 MG PO TABS
40.0000 mg | ORAL_TABLET | Freq: Every day | ORAL | Status: DC
  Administered 2018-10-15: 40 mg via ORAL
  Filled 2018-10-14: qty 2

## 2018-10-14 MED ORDER — POTASSIUM CHLORIDE CRYS ER 20 MEQ PO TBCR
40.0000 meq | EXTENDED_RELEASE_TABLET | Freq: Once | ORAL | Status: AC
  Administered 2018-10-14: 40 meq via ORAL
  Filled 2018-10-14: qty 2

## 2018-10-14 MED ORDER — METHYLPREDNISOLONE SODIUM SUCC 40 MG IJ SOLR
40.0000 mg | Freq: Two times a day (BID) | INTRAMUSCULAR | Status: AC
  Administered 2018-10-14 – 2018-10-15 (×2): 40 mg via INTRAVENOUS
  Filled 2018-10-14 (×2): qty 1

## 2018-10-14 NOTE — Accreditation Note (Signed)
Pt is going to 6N room 21. Report was given to new nurse.

## 2018-10-14 NOTE — Progress Notes (Signed)
Progress Note    Rachel RuddKiran Jakel  ZOX:[id number] DOB: [birth year]  DOA: 10/12/2018 PCP: August AlbinoWray, Walter H, MD    Brief Narrative:     Medical records reviewed and are as summarized below:  Rachel Cole is an 62 y.o. female with medical history significant of obesity s/p gastric bypass surgery and ADHD who was [facility] from [facility]-2 at [facility] for multifocal PNA.    She started getting sick on Friday.  She works as a Psychologist, sport and exerciseparalegal and church organist - she went to church Sunday but felt horrible and she was admitted with PNA/hMNV at [facility].  After d/c did not fill prescriptions of prednisone or abx.    Assessment/Plan:   Active Problems:   Right upper lobe pneumonia (HCC)   Obesity (BMI 35.0-39.9 without comorbidity)  pna due to human metapneumovirus -continue IV abx as patient not eating well and has continued nausea and vomiting -IVF for hydration (see above) -PRN and scheduled nebs -outpatient xray to ensure resolution of PNA -pulm toilet -slow to improve -IV steroids x 2 doses then PO prednisone in AM if vomiting better  Obesity Body mass index is 36.76 kg/m.  Anemia -? dilutional -outpatient follow up  Hypokalemia -replete in IVF as vomiting and having diarrhea -check Mg  Diarrhea -add lactobacillus -not watery  Family Communication/Anticipated D/C date and plan/Code Status   DVT prophylaxis: Lovenox ordered. Code Status: dnr Family Communication:  Disposition Plan: has nausea and vomiting-- not eating much so will continue IV abx, steroids and IVF, monitor for improvements  Medical Consultants:      Subjective:   Having nausea and vomiting  Objective:    Vitals:   10/13/18 2329 10/14/18 0325 10/14/18 0541 10/14/18 1200  BP: 138/81  115/68 (!) 147/92  Pulse: 78  73   Resp: 18  18 16   Temp: 98.1 F (36.7 C)  98.2 F (36.8 C) 99.1 F (37.3 C)  TempSrc: Oral  Oral Oral  SpO2: 96% 98% 97% 98%  Weight:      Height:       No intake or  output data in the 24 hours ending 10/14/18 1246 Filed Weights   10/12/18 1239  Weight: 91.2 kg    Exam: Alert and orient Mildly uncomfortable Coarse breath sounds with slight inspiratory wheeze-- ? Upper airway No LE pitting edema obese  Data Reviewed:   I have personally reviewed following labs and imaging studies:  Labs: Labs show the following:   Basic Metabolic Panel: Recent Labs  Lab 10/12/18 1311 10/13/18 0353 10/14/18 0433  NA 140 140 140  K 3.4* 3.3* 3.1*  CL 109 109 112*  CO2 20* 20* 19*  GLUCOSE 99 92 100*  BUN 9 7* <5*  CREATININE 0.69 0.63 0.74  CALCIUM 8.4* 7.3* 7.4*   GFR Estimated Creatinine Clearance: 76.5 mL/min (by C-G formula based on SCr of 0.74 mg/dL). Liver Function Tests: No results for input(s): AST, ALT, ALKPHOS, BILITOT, PROT, ALBUMIN in the last 168 hours. No results for input(s): LIPASE, AMYLASE in the last 168 hours. No results for input(s): AMMONIA in the last 168 hours. Coagulation profile No results for input(s): INR, PROTIME in the last 168 hours.  CBC: Recent Labs  Lab 10/12/18 1311 10/13/18 0353 10/14/18 0433  WBC 6.4 5.3 5.4  NEUTROABS  --  2.3  --   HGB 11.0* 9.4* 9.5*  HCT 37.5 32.2* 32.5*  MCV 92.1 92.0 91.3  PLT 223 207 239   Cardiac Enzymes: No results for input(s):  CKTOTAL, CKMB, CKMBINDEX, TROPONINI in the last 168 hours. BNP (last 3 results) No results for input(s): PROBNP in the last 8760 hours. CBG: No results for input(s): GLUCAP in the last 168 hours. D-Dimer: No results for input(s): DDIMER in the last 72 hours. Hgb A1c: No results for input(s): HGBA1C in the last 72 hours. Lipid Profile: No results for input(s): CHOL, HDL, LDLCALC, TRIG, CHOLHDL, LDLDIRECT in the last 72 hours. Thyroid function studies: No results for input(s): TSH, T4TOTAL, T3FREE, THYROIDAB in the last 72 hours.  Invalid input(s): FREET3 Anemia work up: No results for input(s): VITAMINB12, FOLATE, FERRITIN, TIBC, IRON,  RETICCTPCT in the last 72 hours. Sepsis Labs: Recent Labs  Lab 10/12/18 1311 10/12/18 1654 10/13/18 0353 10/14/18 0433  PROCALCITON  --  <0.10  --   --   WBC 6.4  --  5.3 5.4    Microbiology Recent Results (from the past 240 hour(s))  Culture, blood (single)     Status: None (Preliminary result)   Collection Time: 10/12/18  5:00 PM  Result Value Ref Range Status   Specimen Description BLOOD LEFT WRIST  Final   Special Requests   Final    BOTTLES DRAWN AEROBIC AND ANAEROBIC Blood Culture adequate volume   Culture   Final    NO GROWTH 2 DAYS Performed at [facility], [address], [place], [place] [address]    Report Status PENDING  Incomplete  Culture, sputum-assessment     Status: None   Collection Time: 10/12/18  8:52 PM  Result Value Ref Range Status   Specimen Description EXPECTORATED SPUTUM  Final   Special Requests   Final    Normal Performed at [facility], [address], [place], [place] [address]    Sputum evaluation THIS SPECIMEN IS ACCEPTABLE FOR SPUTUM CULTURE  Final   Report Status 10/12/2018 FINAL  Final  Culture, respiratory     Status: None (Preliminary result)   Collection Time: 10/12/18  8:52 PM  Result Value Ref Range Status   Specimen Description EXPECTORATED SPUTUM  Final   Special Requests Normal Reflexed from [id number]  Final   Gram Stain   Final    ABUNDANT WBC PRESENT, PREDOMINANTLY PMN RARE SQUAMOUS EPITHELIAL CELLS PRESENT RARE GRAM POSITIVE COCCI RARE YEAST    Culture   Final    FEW Consistent with normal respiratory flora. Performed at [facility], [address], [place], [place] [address]    Report Status PENDING  Incomplete    Procedures and diagnostic studies:  Dg Chest Port 1 View  Result Date: 10/12/2018 CLINICAL DATA:  New leading nose pneumonia prematurely discharge. EXAM: PORTABLE CHEST 1 VIEW COMPARISON:  04/05/2018 CT, 04/04/2018 CXR FINDINGS: The heart size and mediastinal contours are within normal  limits. The thoracic aorta is slightly atherosclerotic and uncoiled in appearance. No aneurysm. No effusion or pneumothorax. Subtle pulmonary opacity in the right upper lobe consistent with pneumonia. This is superimposed on mild chronic interstitial prominence. The visualized skeletal structures are unremarkable. IMPRESSION: Small focus of airspace disease in the right upper lobe consistent with pneumonia. Electronically Signed   By: Tollie Eth M.D.   On: 10/12/2018 13:43    Medications:   . enoxaparin (LOVENOX) injection  40 mg Subcutaneous Q24H  . guaiFENesin  600 mg Oral BID  . ipratropium-albuterol  3 mL Nebulization Q6H  . methylPREDNISolone (SOLU-MEDROL) injection  40 mg Intravenous Q12H  . [START ON 10/15/2018] predniSONE  40 mg Oral Q breakfast  Continuous Infusions: . sodium chloride 50 mL/hr at 10/14/18 0423  . doxycycline (VIBRAMYCIN) IV 100 mg (10/14/18 0531)     LOS: 0 days   Joseph Art  [facility]   *Please refer to [email or web address], password [email or web address] to get updated schedule on who will round on this patient, as hospitalists switch teams weekly. If 7PM-7AM, please contact night-coverage at [email or web address], password [email or web address] for any overnight needs.  10/14/2018, 12:46 PM

## 2018-10-15 ENCOUNTER — Inpatient Hospital Stay (HOSPITAL_COMMUNITY): Payer: 59

## 2018-10-15 DIAGNOSIS — D649 Anemia, unspecified: Secondary | ICD-10-CM | POA: Diagnosis present

## 2018-10-15 DIAGNOSIS — J181 Lobar pneumonia, unspecified organism: Secondary | ICD-10-CM | POA: Diagnosis not present

## 2018-10-15 DIAGNOSIS — Z9884 Bariatric surgery status: Secondary | ICD-10-CM | POA: Diagnosis not present

## 2018-10-15 DIAGNOSIS — J9601 Acute respiratory failure with hypoxia: Secondary | ICD-10-CM | POA: Diagnosis present

## 2018-10-15 DIAGNOSIS — Z66 Do not resuscitate: Secondary | ICD-10-CM | POA: Diagnosis present

## 2018-10-15 DIAGNOSIS — Z6836 Body mass index (BMI) 36.0-36.9, adult: Secondary | ICD-10-CM | POA: Diagnosis not present

## 2018-10-15 DIAGNOSIS — R0600 Dyspnea, unspecified: Secondary | ICD-10-CM | POA: Diagnosis present

## 2018-10-15 DIAGNOSIS — R0602 Shortness of breath: Secondary | ICD-10-CM | POA: Diagnosis present

## 2018-10-15 DIAGNOSIS — Z90710 Acquired absence of both cervix and uterus: Secondary | ICD-10-CM | POA: Diagnosis not present

## 2018-10-15 DIAGNOSIS — J189 Pneumonia, unspecified organism: Secondary | ICD-10-CM | POA: Diagnosis not present

## 2018-10-15 DIAGNOSIS — J211 Acute bronchiolitis due to human metapneumovirus: Secondary | ICD-10-CM | POA: Diagnosis present

## 2018-10-15 DIAGNOSIS — E669 Obesity, unspecified: Secondary | ICD-10-CM | POA: Diagnosis present

## 2018-10-15 DIAGNOSIS — J123 Human metapneumovirus pneumonia: Secondary | ICD-10-CM | POA: Diagnosis present

## 2018-10-15 DIAGNOSIS — Z9049 Acquired absence of other specified parts of digestive tract: Secondary | ICD-10-CM | POA: Diagnosis not present

## 2018-10-15 DIAGNOSIS — E876 Hypokalemia: Secondary | ICD-10-CM | POA: Diagnosis present

## 2018-10-15 LAB — CULTURE, RESPIRATORY W GRAM STAIN
Culture: NORMAL
Special Requests: NORMAL

## 2018-10-15 LAB — BASIC METABOLIC PANEL
Anion gap: 10 (ref 5–15)
BUN: <5 mg/dL — ABNORMAL LOW (ref 8–23)
CO2: 18 mmol/L — AB (ref 22–32)
Calcium: 7.9 mg/dL — ABNORMAL LOW (ref 8.9–10.3)
Chloride: 112 mmol/L — ABNORMAL HIGH (ref 98–111)
Creatinine, Ser: 0.56 mg/dL (ref 0.44–1.00)
GFR calc Af Amer: >60 mL/min (ref >60)
GFR calc non Af Amer: >60 mL/min (ref >60)
Glucose, Bld: 120 mg/dL — ABNORMAL HIGH (ref 70–99)
Potassium: 3.9 mmol/L (ref 3.5–5.1)
Sodium: 140 mmol/L (ref 135–145)

## 2018-10-15 LAB — MAGNESIUM: Magnesium: 1.9 mg/dL (ref 1.7–2.4)

## 2018-10-15 LAB — CULTURE, RESPIRATORY

## 2018-10-15 MED ORDER — METHYLPREDNISOLONE SODIUM SUCC 40 MG IJ SOLR
40.0000 mg | Freq: Two times a day (BID) | INTRAMUSCULAR | Status: AC
  Administered 2018-10-15 – 2018-10-17 (×4): 40 mg via INTRAVENOUS
  Filled 2018-10-15 (×4): qty 1

## 2018-10-15 NOTE — Plan of Care (Signed)
  Problem: Education: Goal: Knowledge of General Education information will improve Description: Including pain rating scale, medication(s)/side effects and non-pharmacologic comfort measures Outcome: Progressing   Problem: Health Behavior/Discharge Planning: Goal: Ability to manage health-related needs will improve Outcome: Progressing   Problem: Clinical Measurements: Goal: Ability to maintain clinical measurements within normal limits will improve Outcome: Progressing Goal: Will remain free from infection Outcome: Progressing   Problem: Pain Managment: Goal: General experience of comfort will improve Outcome: Progressing   

## 2018-10-15 NOTE — Progress Notes (Addendum)
Progress Note    Rachel Cole  NWG:[id number] DOB: 1956/03/15  DOA: 10/12/2018 PCP: August Albino, MD    Brief Narrative:     Medical records reviewed and are as summarized below:  Rachel Cole is an 62 y.o. female with medical history significant of obesity s/p gastric bypass surgery and ADHD who was admitted from 12/1-2 at [facility] for multifocal PNA.    She started getting sick on Friday.  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  After d/c did not fill prescriptions of prednisone or abx.  Now back with worsening shortness of breath.  She has been slow to recover in the hospital  Assessment/Plan:   Active Problems:   Right upper lobe pneumonia (HCC)   Obesity (BMI 35.0-39.9 without comorbidity)   Dyspnea  pna due to human metapneumovirus -d/c abx after today's dose-- has gotten a total of 5 days -d/c IVF for now--- eating well per patient w/o nausea -PRN and scheduled nebs -pulm toilet -IV steroids  -CT scan of chest w/o contrast (patient reports possible allergy to contrast) -home O2 study in AM -since so slow to improve, will get BNP although does not sound like heart failure  Obesity Body mass index is 36.98 kg/m. -s/p Gastric bypass  Anemia -? dilutional -outpatient follow up -CBC in AM  Hypokalemia -repleted  Diarrhea -add lactobacillus -improved  Family Communication/Anticipated D/C date and plan/Code Status   DVT prophylaxis: Lovenox ordered. Code Status: full (changed on 12/7) Family Communication:  Disposition Plan: pending improvement/CT scan  Medical Consultants:      Subjective:   Feels like her chest/breathing has tightened  Objective:    Vitals:   10/14/18 1200 10/14/18 2117 10/15/18 0508 10/15/18 1345  BP: (!) 147/92 133/83 (!) 135/93 (!) 147/77  Pulse:  76 79 84  Resp: 16 18 19 18   Temp: 99.1 F (37.3 C) 98.1 F (36.7  C) 97.6 F (36.4 C) 98.1 F (36.7 C)  TempSrc: Oral Oral Oral Oral  SpO2: 98% 97% 97% 97%  Weight:  91.7 kg    Height:  5\' 2"  (1.575 m)      Intake/Output Summary (Last 24 hours) at 10/15/2018 1449 Last data filed at 10/15/2018 0533 Gross per 24 hour  Intake 250 ml  Output -  Net 250 ml   Filed Weights   10/12/18 1239 10/14/18 2117  Weight: 91.2 kg 91.7 kg    Exam: In bed, on phone rrr Wheezing in all lung fields (? Upper airway) +BS, soft Min LE edema, non-pitting   Data Reviewed:   I have personally reviewed following labs and imaging studies:  Labs: Labs show the following:   Basic Metabolic Panel: Recent Labs  Lab 10/12/18 1311 10/13/18 0353 10/14/18 0433 10/15/18 0246  NA 140 140 140 140  K 3.4* 3.3* 3.1* 3.9  CL 109 109 112* 112*  CO2 20* 20* 19* 18*  GLUCOSE 99 92 100* 120*  BUN 9 7* <5* <5*  CREATININE 0.69 0.63 0.74 0.56  CALCIUM 8.4* 7.3* 7.4* 7.9*  MG  --   --   --  1.9   GFR Estimated Creatinine Clearance: 76.8 mL/min (by C-G formula based on SCr of 0.56 mg/dL). Liver Function Tests: No results for input(s): AST, ALT, ALKPHOS, BILITOT, PROT, ALBUMIN in the last 168 hours. No results for input(s): LIPASE, AMYLASE in the last 168 hours. No results  for input(s): AMMONIA in the last 168 hours. Coagulation profile No results for input(s): INR, PROTIME in the last 168 hours.  CBC: Recent Labs  Lab 10/12/18 1311 10/13/18 0353 10/14/18 0433  WBC 6.4 5.3 5.4  NEUTROABS  --  2.3  --   HGB 11.0* 9.4* 9.5*  HCT 37.5 32.2* 32.5*  MCV 92.1 92.0 91.3  PLT 223 207 239   Cardiac Enzymes: No results for input(s): CKTOTAL, CKMB, CKMBINDEX, TROPONINI in the last 168 hours. BNP (last 3 results) No results for input(s): PROBNP in the last 8760 hours. CBG: No results for input(s): GLUCAP in the last 168 hours. D-Dimer: No results for input(s): DDIMER in the last 72 hours. Hgb A1c: No results for input(s): HGBA1C in the last 72 hours. Lipid  Profile: No results for input(s): CHOL, HDL, LDLCALC, TRIG, CHOLHDL, LDLDIRECT in the last 72 hours. Thyroid function studies: No results for input(s): TSH, T4TOTAL, T3FREE, THYROIDAB in the last 72 hours.  Invalid input(s): FREET3 Anemia work up: No results for input(s): VITAMINB12, FOLATE, FERRITIN, TIBC, IRON, RETICCTPCT in the last 72 hours. Sepsis Labs: Recent Labs  Lab 10/12/18 1311 10/12/18 1654 10/13/18 0353 10/14/18 0433  PROCALCITON  --  <0.10  --   --   WBC 6.4  --  5.3 5.4    Microbiology Recent Results (from the past 240 hour(s))  Culture, blood (single)     Status: None (Preliminary result)   Collection Time: 10/12/18  5:00 PM  Result Value Ref Range Status   Specimen Description BLOOD LEFT WRIST  Final   Special Requests   Final    BOTTLES DRAWN AEROBIC AND ANAEROBIC Blood Culture adequate volume   Culture   Final    NO GROWTH 3 DAYS Performed at [facility], [address] [place], [place] [address]    Report Status PENDING  Incomplete  Culture, sputum-assessment     Status: None   Collection Time: 10/12/18  8:52 PM  Result Value Ref Range Status   Specimen Description EXPECTORATED SPUTUM  Final   Special Requests   Final    Normal Performed at [facility], [address], [place], [place] [address]    Sputum evaluation THIS SPECIMEN IS ACCEPTABLE FOR SPUTUM CULTURE  Final   Report Status 10/12/2018 FINAL  Final  Culture, respiratory     Status: None   Collection Time: 10/12/18  8:52 PM  Result Value Ref Range Status   Specimen Description EXPECTORATED SPUTUM  Final   Special Requests Normal Reflexed from [id number]  Final   Gram Stain   Final    ABUNDANT WBC PRESENT, PREDOMINANTLY PMN RARE SQUAMOUS EPITHELIAL CELLS PRESENT RARE GRAM POSITIVE COCCI RARE YEAST    Culture   Final    FEW Consistent with normal respiratory flora. Performed at [facility], [address], [place], [place] [address]    Report Status 10/15/2018 FINAL   Final    Procedures and diagnostic studies:  No results found.  Medications:   . acidophilus  1 capsule Oral Daily  . enoxaparin (LOVENOX) injection  40 mg Subcutaneous Q24H  . methylPREDNISolone (SOLU-MEDROL) injection  40 mg Intravenous Q12H   Continuous Infusions: . doxycycline (VIBRAMYCIN) IV 100 mg (10/15/18 0533)     LOS: 0 days   Joseph Art  [facility]   *Please refer to [email or web address], password [email or web address] to get updated schedule on who will round on this patient, as hospitalists switch teams weekly. If 7PM-7AM, please  contact night-coverage at [email or web address], password [email or web address] for any overnight needs.  10/15/2018, 2:49 PM

## 2018-10-16 DIAGNOSIS — R0602 Shortness of breath: Secondary | ICD-10-CM

## 2018-10-16 LAB — BASIC METABOLIC PANEL
ANION GAP: 9 (ref 5–15)
BUN: 6 mg/dL — ABNORMAL LOW (ref 8–23)
CO2: 20 mmol/L — ABNORMAL LOW (ref 22–32)
Calcium: 8.3 mg/dL — ABNORMAL LOW (ref 8.9–10.3)
Chloride: 110 mmol/L (ref 98–111)
Creatinine, Ser: 0.54 mg/dL (ref 0.44–1.00)
GFR calc Af Amer: >60 mL/min (ref >60)
GFR calc non Af Amer: >60 mL/min (ref >60)
Glucose, Bld: 124 mg/dL — ABNORMAL HIGH (ref 70–99)
Potassium: 3.8 mmol/L (ref 3.5–5.1)
Sodium: 139 mmol/L (ref 135–145)

## 2018-10-16 LAB — CBC
HCT: 33.4 % — ABNORMAL LOW (ref 36.0–46.0)
Hemoglobin: 9.8 g/dL — ABNORMAL LOW (ref 12.0–15.0)
MCH: 26.3 pg (ref 26.0–34.0)
MCHC: 29.3 g/dL — AB (ref 30.0–36.0)
MCV: 89.8 fL (ref 80.0–100.0)
NRBC: 0 % (ref 0.0–0.2)
Platelets: 313 10*3/uL (ref 150–400)
RBC: 3.72 MIL/uL — ABNORMAL LOW (ref 3.87–5.11)
RDW: 20.7 % — AB (ref 11.5–15.5)
WBC: 8.9 10*3/uL (ref 4.0–10.5)

## 2018-10-16 LAB — BRAIN NATRIURETIC PEPTIDE: B Natriuretic Peptide: 347.8 pg/mL — ABNORMAL HIGH (ref 0.0–100.0)

## 2018-10-16 MED ORDER — IPRATROPIUM-ALBUTEROL 0.5-2.5 (3) MG/3ML IN SOLN
3.0000 mL | Freq: Three times a day (TID) | RESPIRATORY_TRACT | Status: DC
  Administered 2018-10-16 – 2018-10-17 (×3): 3 mL via RESPIRATORY_TRACT
  Filled 2018-10-16 (×3): qty 3

## 2018-10-16 MED ORDER — FUROSEMIDE 10 MG/ML IJ SOLN
20.0000 mg | Freq: Once | INTRAMUSCULAR | Status: AC
  Administered 2018-10-16: 20 mg via INTRAVENOUS
  Filled 2018-10-16: qty 2

## 2018-10-16 NOTE — Plan of Care (Signed)
  Problem: Health Behavior/Discharge Planning: Goal: Ability to manage health-related needs will improve Outcome: Progressing   Problem: Pain Managment: Goal: General experience of comfort will improve Outcome: Progressing   Problem: Safety: Goal: Ability to remain free from injury will improve Outcome: Progressing   

## 2018-10-16 NOTE — Progress Notes (Signed)
[facility]                                                                              Patient Demographics  Rachel Cole, is a 62 y.o. female, DOB - 1956-01-24, RUE:[id number]  Admit date - 10/12/2018   Admitting Physician Jonah Blue, MD  Outpatient Primary MD for the patient is August Albino, MD  Outpatient specialists:   LOS - 1  days   Medical records reviewed and are as summarized below:    Chief Complaint  Patient presents with  . Shortness of Breath       Brief summary   Rachel Cole is an 62 y.o. female with medical history significant ofobesity s/p gastric bypass surgery and ADHD who was admitted from 12/1-2 at [facility] for multifocal PNA.She started getting sick on Friday. She works as a Psychologist, sport and exercise - she went to church Sunday but felt horrible and she was admitted with PNA/hMNV at [facility]. After d/c did not fill prescriptions of prednisone or abx.  Now back with worsening shortness of breath.  She has been slow to recover in the hospital  Assessment & Plan     Acute respiratory failure secondary to right upper lobe pneumonia (HCC) -Patient was admitted 12/1 to 12/2 at [facility] for multifocal pneumonia, after discharge, did not fill prescriptions of the antibiotics or prednisone, return back to the ED with worsening shortness of breath. -Received Zithromax and Rocephin on 12/4, transition to doxycycline on 12/5- 12/7 -BNP 347, will give 1 dose of Lasix 20 mg iv -CT chest showed patchy groundglass opacities, airspace consolidation in the right upper lobe, patchy lower lobe airspace opacities.  No worrisome lesion  Metapneumovirus acute bronchitis -Still wheezing, does not feel at baseline, continue IV steroids today,  -Continue scheduled duo nebs and albuterol as needed -Recommended incentive spirometry and pulmonary toileting     Obesity (BMI 35.0-39.9 without comorbidity) -Had a history of gastric bypass,  counseled on diet and weight control  Anemia, normocytic -Hemoglobin currently at baseline, no acute issues  Hypokalemia Resolved  Diarrhea Resolved  Code Status: Full CODE STATUS DVT Prophylaxis:  Lovenox  Family Communication: Discussed in detail with the patient, all imaging results, lab results explained to the patient   Disposition Plan: If wheezing improving, will DC home in a.m.  Time Spent in minutes   35 minutes  Procedures:  CT chest  Consultants:   None  Antimicrobials:      Medications  Scheduled Meds: . acidophilus  1 capsule Oral Daily  . enoxaparin (LOVENOX) injection  40 mg Subcutaneous Q24H  . methylPREDNISolone (SOLU-MEDROL) injection  40 mg Intravenous Q12H   Continuous Infusions: PRN Meds:.acetaminophen, albuterol, ondansetron (ZOFRAN) IV   Antibiotics   Anti-infectives (From admission, onward)   Start     Dose/Rate Route Frequency Ordered Stop   10/13/18 1800  doxycycline (VIBRAMYCIN) 100 mg in sodium chloride 0.9 % 250 mL IVPB     10 0 mg 125 mL/hr over 120 Minutes Intravenous Every 12 hours 10/13/18 1602 10/15/18 1937   10/13/18 1500  azithromycin (ZITHROMAX) 500 mg in sodium chloride 0.9 % 250 mL  IVPB     500 mg 250 mL/hr over 60 Minutes Intravenous  Once 10/12/18 1619 10/13/18 1600   10/13/18 1430  cefTRIAXone (ROCEPHIN) 2 g in sodium chloride 0.9 % 100 mL IVPB     2 g 200 mL/hr over 30 Minutes Intravenous  Once 10/12/18 1619 10/13/18 1455   10/12/18 1315  cefTRIAXone (ROCEPHIN) 2 g in sodium chloride 0.9 % 100 mL IVPB     2 g 200 mL/hr over 30 Minutes Intravenous  Once 10/12/18 1313 10/12/18 1502   10/12/18 1315  azithromycin (ZITHROMAX) 500 mg in sodium chloride 0.9 % 250 mL IVPB     500 mg 250 mL/hr over 60 Minutes Intravenous  Once 10/12/18 1313 10/12/18 1622        Subjective:   Rachel Cole was seen and examined today.  Still having some shortness of breath with wheezing, not at baseline yet.  No fevers or chills.   Patient denies dizziness, abdominal pain, N/V/D/C, new weakness, numbess, tingling. No acute events overnight.    Objective:   Vitals:   10/15/18 0508 10/15/18 1345 10/15/18 2150 10/16/18 0519  BP: (!) 135/93 (!) 147/77 (!) 141/84 118/71  Pulse: 79 84 82 61  Resp: 19 18 16 16   Temp: 97.6 F (36.4 C) 98.1 F (36.7 C) 97.7 F (36.5 C) 98 F (36.7 C)  TempSrc: Oral Oral Oral Oral  SpO2: 97% 97% 98% 100%  Weight:      Height:        Intake/Output Summary (Last 24 hours) at 10/16/2018 1245 Last data filed at 10/15/2018 1900 Gross per 24 hour  Intake 1441.38 ml  Output -  Net 1441.38 ml     Wt Readings from Last 3 Encounters:  10/14/18 91.7 kg  07/03/17 77.1 kg  12/21/16 74.8 kg     Exam  General: Alert and oriented x 3, NAD  Eyes:  HEENT:  Atraumatic, normocephalic  Cardiovascular: S1 S2 auscultated Regular rate and rhythm.  Respiratory: Mild expiratory wheezing bilaterally  Gastrointestinal: Soft, nontender, nondistended, + bowel sounds  Ext: no pedal edema bilaterally  Neuro: No new deficits  Musculoskeletal: No digital cyanosis, clubbing  Skin: No rashes  Psych: Normal affect and demeanor, alert and oriented x3    Data Reviewed:  I have personally reviewed following labs and imaging studies  Micro Results Recent Results (from the past 240 hour(s))  Culture, blood (single)     Status: None (Preliminary result)   Collection Time: 10/12/18  5:00 PM  Result Value Ref Range Status   Specimen Description BLOOD LEFT WRIST  Final   Special Requests   Final    BOTTLES DRAWN AEROBIC AND ANAEROBIC Blood Culture adequate volume   Culture   Final    NO GROWTH 3 DAYS Performed at [facility], [address] [place], [place] [address]    Report Status PENDING  Incomplete  Culture, sputum-assessment     Status: None   Collection Time: 10/12/18  8:52 PM  Result Value Ref Range Status   Specimen Description EXPECTORATED SPUTUM  Final   Special  Requests   Final    Normal Performed at [facility], [address] [place], [place] [address]    Sputum evaluation THIS SPECIMEN IS ACCEPTABLE FOR SPUTUM CULTURE  Final   Report Status 10/12/2018 FINAL  Final  Culture, respiratory     Status: None   Collection Time: 10/12/18  8:52 PM  Result Value Ref Range Status   Specimen  Description EXPECTORATED SPUTUM  Final   Special Requests Normal Reflexed from [id number]  Final   Gram Stain   Final    ABUNDANT WBC PRESENT, PREDOMINANTLY PMN RARE SQUAMOUS EPITHELIAL CELLS PRESENT RARE GRAM POSITIVE COCCI RARE YEAST    Culture   Final    FEW Consistent with normal respiratory flora. Performed at [facility], [address] [place], [place] [address]    Report Status 10/15/2018 FINAL  Final    Radiology Reports Ct Chest Wo Contrast  Result Date: 10/15/2018 CLINICAL DATA:  Shortness of breath.  Unresolved pneumonia. EXAM: CT CHEST WITHOUT CONTRAST TECHNIQUE: Multidetector CT imaging of the chest was performed following the standard protocol without IV contrast. COMPARISON:  Chest CT 04/05/2018 FINDINGS: Cardiovascular: The heart is normal in size. No pericardial effusion. The aorta is normal in caliber. No atherosclerotic calcifications. Mediastinum/Nodes: Small scattered mediastinal and hilar lymph nodes appears stable. No mass or overt adenopathy. The esophagus is grossly normal. Lungs/Pleura: Patchy ground-glass opacity and a few patchy areas of more distinct airspace consolidation in the right upper lobe may represent a developing or clearing pneumonia. Subpleural atelectasis noted along the right major fissure. Streaky basilar atelectasis and a few patchy areas of ground-glass opacity suggesting some residual inflammation. No bronchiectasis or interstitial lung disease. No worrisome pulmonary lesions and no definite endobronchial lesions. Upper Abdomen: Surgical changes from gastric bypass surgery without complicating features. No  other significant upper abdominal findings. Musculoskeletal: No breast masses, supraclavicular or axillary adenopathy. No significant bony findings. There is a chronic midthoracic compression fracture. IMPRESSION: 1. Patchy ground-glass opacity and a few adjacent patchy areas of more distinct airspace consolidation in the right upper lobe. Findings likely represent a developing or clearing pneumonia. 2. Other lower lobe patchy airspace opacities likely reflecting persistent inflammation. 3. No worrisome pulmonary lesions, pulmonary edema or pleural effusion. Electronically Signed   By: Rudie Meyer M.D.   On: 10/15/2018 17:30   Dg Chest Port 1 View  Result Date: 10/12/2018 CLINICAL DATA:  New leading nose pneumonia prematurely discharge. EXAM: PORTABLE CHEST 1 VIEW COMPARISON:  04/05/2018 CT, 04/04/2018 CXR FINDINGS: The heart size and mediastinal contours are within normal limits. The thoracic aorta is slightly atherosclerotic and uncoiled in appearance. No aneurysm. No effusion or pneumothorax. Subtle pulmonary opacity in the right upper lobe consistent with pneumonia. This is superimposed on mild chronic interstitial prominence. The visualized skeletal structures are unremarkable. IMPRESSION: Small focus of airspace disease in the right upper lobe consistent with pneumonia. Electronically Signed   By: Tollie Eth M.D.   On: 10/12/2018 13:43    Lab Data:  CBC: Recent Labs  Lab 10/12/18 1311 10/13/18 0353 10/14/18 0433 10/16/18 0350  WBC 6.4 5.3 5.4 8.9  NEUTROABS  --  2.3  --   --   HGB 11.0* 9.4* 9.5* 9.8*  HCT 37.5 32.2* 32.5* 33.4*  MCV 92.1 92.0 91.3 89.8  PLT 223 207 239 313   Basic Metabolic Panel: Recent Labs  Lab 10/12/18 1311 10/13/18 0353 10/14/18 0433 10/15/18 0246 10/16/18 0350  NA 140 140 140 140 139  K 3.4* 3.3* 3.1* 3.9 3.8  CL 109 109 112* 112* 110  CO2 20* 20* 19* 18* 20*  GLUCOSE 99 92 100* 120* 124*  BUN 9 7* <5* <5* 6*  CREATININE 0.69 0.63 0.74 0.56 0.54    CALCIUM 8.4* 7.3* 7.4* 7.9* 8.3*  MG  --   --   --  1.9  --    GFR: Estimated  Creatinine Clearance: 76.8 mL/min (by C-G formula based on SCr of 0.54 mg/dL). Liver Function Tests: No results for input(s): AST, ALT, ALKPHOS, BILITOT, PROT, ALBUMIN in the last 168 hours. No results for input(s): LIPASE, AMYLASE in the last 168 hours. No results for input(s): AMMONIA in the last 168 hours. Coagulation Profile: No results for input(s): INR, PROTIME in the last 168 hours. Cardiac Enzymes: No results for input(s): CKTOTAL, CKMB, CKMBINDEX, TROPONINI in the last 168 hours. BNP (last 3 results) No results for input(s): PROBNP in the last 8760 hours. HbA1C: No results for input(s): HGBA1C in the last 72 hours. CBG: No results for input(s): GLUCAP in the last 168 hours. Lipid Profile: No results for input(s): CHOL, HDL, LDLCALC, TRIG, CHOLHDL, LDLDIRECT in the last 72 hours. Thyroid Function Tests: No results for input(s): TSH, T4TOTAL, FREET4, T3FREE, THYROIDAB in the last 72 hours. Anemia Panel: No results for input(s): VITAMINB12, FOLATE, FERRITIN, TIBC, IRON, RETICCTPCT in the last 72 hours. Urine analysis:    Component Value Date/Time   COLORURINE STRAW (A) 08/23/2017 1236   APPEARANCEUR CLEAR 08/23/2017 1236   LABSPEC 1.002 (L) 08/23/2017 1236   PHURINE 6.0 08/23/2017 1236   GLUCOSEU NEGATIVE 08/23/2017 1236   HGBUR NEGATIVE 08/23/2017 1236   BILIRUBINUR NEGATIVE 08/23/2017 1236   KETONESUR NEGATIVE 08/23/2017 1236   PROTEINUR NEGATIVE 08/23/2017 1236   NITRITE NEGATIVE 08/23/2017 1236   LEUKOCYTESUR NEGATIVE 08/23/2017 1236     Rachel Cole M.D. [facility] 10/16/2018, 12:45 PM  Pager: [phone] Between 7am to 7pm - call Pager - [phone]  After 7pm go to [email or web address] - password [email or web address]  Call night coverage person covering after 7pm

## 2018-10-17 LAB — BASIC METABOLIC PANEL
Anion gap: 8 (ref 5–15)
BUN: 10 mg/dL (ref 8–23)
CO2: 24 mmol/L (ref 22–32)
Calcium: 8.3 mg/dL — ABNORMAL LOW (ref 8.9–10.3)
Chloride: 107 mmol/L (ref 98–111)
Creatinine, Ser: 0.68 mg/dL (ref 0.44–1.00)
GFR calc Af Amer: >60 mL/min (ref >60)
GFR calc non Af Amer: >60 mL/min (ref >60)
Glucose, Bld: 115 mg/dL — ABNORMAL HIGH (ref 70–99)
Potassium: 3.5 mmol/L (ref 3.5–5.1)
Sodium: 139 mmol/L (ref 135–145)

## 2018-10-17 LAB — CULTURE, BLOOD (SINGLE)
Culture: NO GROWTH
SPECIAL REQUESTS: ADEQUATE

## 2018-10-17 MED ORDER — ALBUTEROL SULFATE HFA 108 (90 BASE) MCG/ACT IN AERS: 2.0000 | INHALATION_SPRAY | RESPIRATORY_TRACT | Status: DC | PRN

## 2018-10-17 MED ORDER — PREDNISONE 20 MG PO TABS
40.0000 mg | ORAL_TABLET | Freq: Every day | ORAL | Status: DC
  Administered 2018-10-17: 40 mg via ORAL
  Filled 2018-10-17: qty 2

## 2018-10-17 MED ORDER — LORATADINE 10 MG PO TABS: 10.0000 mg | ORAL_TABLET | Freq: Every day | ORAL | 1 refills | Status: AC | PRN

## 2018-10-17 MED ORDER — ALBUTEROL SULFATE (2.5 MG/3ML) 0.083% IN NEBU: 2.5000 mg | INHALATION_SOLUTION | RESPIRATORY_TRACT | Status: DC | PRN

## 2018-10-17 MED ORDER — ALBUTEROL SULFATE HFA 108 (90 BASE) MCG/ACT IN AERS: 2.0000 | INHALATION_SPRAY | Freq: Four times a day (QID) | RESPIRATORY_TRACT | 2 refills | Status: AC | PRN

## 2018-10-17 MED ORDER — PREDNISONE 20 MG PO TABS: 40.0000 mg | ORAL_TABLET | Freq: Every day | ORAL | 0 refills | Status: AC

## 2018-10-17 MED FILL — VENTOLIN HFA 90 MCG INHALER: 108 (90 BAS | 25 days supply | Qty: 18 | Fill #0 | Status: TO

## 2018-10-17 MED FILL — LORATADINE 10 MG TABLET: 10 | 100 days supply | Qty: 100 | Fill #0 | Status: TO

## 2018-10-17 MED FILL — predniSONE 20 MG TABS: 20 | 5 days supply | Qty: 10 | Fill #0

## 2018-10-17 NOTE — Discharge Summary (Signed)
Physician Discharge Summary   Patient ID: Rachel Cole MRN: [id number] DOB/AGE: [birth year] 62 y.o.  Admit date: 10/12/2018 Discharge date: 10/17/2018  Primary Care Physician:  August Albino, MD   Recommendations for Outpatient Follow-up:  1. Follow up with PCP in 1-2 weeks  Home Health: None Equipment/Devices:   Discharge Condition: stable CODE STATUS: FULL  Diet recommendation: Heart healthy diet   Discharge Diagnoses:   Acute respiratory failure with hypoxia Metapneumovirus acute bronchitis . Right upper lobe pneumonia (HCC) . Obesity (BMI 35.0-39.9 without comorbidity) . Normocytic anemia Hypokalemia   Consults: None    Allergies:   Allergies  Allergen Reactions  . Bee Venom Anaphylaxis  . Cat Hair Extract Anaphylaxis  . Ivp Dye [Iodinated Diagnostic Agents] Shortness Of Breath  . Penicillins Anaphylaxis    Has patient had a PCN reaction causing immediate rash, facial/tongue/throat swelling, SOB or lightheadedness with hypotension: Yes Has patient had a PCN reaction causing severe rash involving mucus membranes or skin necrosis: Unk Has patient had a PCN reaction that required hospitalization: Unk Has patient had a PCN reaction occurring within the last 10 years: No If all of the above answers are "NO", then may proceed with Cephalosporin use.   . Azithromycin     Epigastric pain, nausea, burning of soles of feet.     DISCHARGE MEDICATIONS: Allergies as of 10/17/2018      Reactions   Bee Venom Anaphylaxis   Cat Hair Extract Anaphylaxis   Ivp Dye [iodinated Diagnostic Agents] Shortness Of Breath   Penicillins Anaphylaxis   Has patient had a PCN reaction causing immediate rash, facial/tongue/throat swelling, SOB or lightheadedness with hypotension: Yes Has patient had a PCN reaction causing severe rash involving mucus membranes or skin necrosis: Unk Has patient had a PCN reaction that required hospitalization: Unk Has patient had a PCN reaction occurring  within the last 10 years: No If all of the above answers are "NO", then may proceed with Cephalosporin use.   Azithromycin    Epigastric pain, nausea, burning of soles of feet.      Medication List    STOP taking these medications   cefdinir 300 MG capsule Commonly known as:  OMNICEF   doxycycline 100 MG capsule Commonly known as:  VIBRAMYCIN   PROAIR RESPICLICK 108 (90 Base) MCG/ACT Aepb Generic drug:  Albuterol Sulfate Replaced by:  albuterol 108 (90 Base) MCG/ACT inhaler     TAKE these medications   albuterol 108 (90 Base) MCG/ACT inhaler Commonly known as:  PROVENTIL HFA;VENTOLIN HFA Inhale 2 puffs into the lungs every 6 (six) hours as needed for wheezing or shortness of breath. Replaces:  PROAIR RESPICLICK 108 (90 Base) MCG/ACT Aepb   loratadine 10 MG tablet Commonly known as:  CLARITIN Take 1 tablet (10 mg total) by mouth daily as needed for allergies or rhinitis.   predniSONE 20 MG tablet Commonly known as:  DELTASONE Take 2 tablets (40 mg total) by mouth daily with breakfast for 5 days. x5 days What changed:    medication strength  how much to take  when to take this        Brief H and P: For complete details please refer to admission H and P, but in brief Rachel Davisis an 62 y.o.femalewith medical history significant ofobesity s/p gastric bypass surgery and ADHD who was admitted from 12/1-2 at [facility] for multifocal PNA.She started getting sick on Friday. 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 After d/c did not fill prescriptions of prednisone or abx.Now back with worsening shortness of breath. She has been slow to recover in the hospital  Hospital Course:   Acute respiratory failure secondary to right upper lobe pneumonia [facility](HCC) -Patient was admitted 12/1 to 12/2 at [facility] for multifocal pneumonia, after discharge, did not fill  prescriptions of the antibiotics or prednisone, return back to the ED with worsening shortness of breath. -Received Zithromax and Rocephin on 12/4, transition to doxycycline on 12/5- 12/7 -BNP 347, likely due to IV fluids, received 1 dose of Lasix -CT chest showed patchy groundglass opacities, airspace consolidation in the right upper lobe, patchy lower lobe airspace opacities.  No worrisome lesion -Has completed a course of antibiotics, continue prednisone for 5 days, Claritin, albuterol inhaler, supportive treatment  Metapneumovirus acute bronchitis -Wheezing is improving, continue oral prednisone for 5 days with Claritin, Ventolin inhaler     Obesity (BMI 35.0-39.9 without comorbidity) -Had a history of gastric bypass, counseled on diet and weight control  Anemia, normocytic -Hemoglobin currently at baseline, no acute issues  Hypokalemia Resolved  Day of Discharge S: Feels a lot better today, no fevers or chills, shortness of breath is improving, minimal wheezing improving.  Hoping to go home today.  BP 130/81 (BP Location: Left Arm)   Pulse (!) 58   Temp (!) 97.3 F (36.3 C) (Oral)   Resp 19   Ht 5\' 2"  (1.575 m)   Wt 91.7 kg   SpO2 98%   BMI 36.98 kg/m   Physical Exam: General: Alert and awake oriented x3 not in any acute distress. HEENT: anicteric sclera, pupils reactive to light and accommodation CVS: S1-S2 clear no murmur rubs or gallops Chest: Minimal scattered wheezing, improved Abdomen: soft nontender, nondistended, normal bowel sounds Extremities: no cyanosis, clubbing or edema noted bilaterally Neuro: Cranial nerves II-XII intact, no focal neurological deficits   The results of significant diagnostics from this hospitalization (including imaging, microbiology, ancillary and laboratory) are listed below for reference.      Procedures/Studies:  Ct Chest Wo Contrast  Result Date: 10/15/2018 CLINICAL DATA:  Shortness of breath.  Unresolved pneumonia.  EXAM: CT CHEST WITHOUT CONTRAST TECHNIQUE: Multidetector CT imaging of the chest was performed following the standard protocol without IV contrast. COMPARISON:  Chest CT 04/05/2018 FINDINGS: Cardiovascular: The heart is normal in size. No pericardial effusion. The aorta is normal in caliber. No atherosclerotic calcifications. Mediastinum/Nodes: Small scattered mediastinal and hilar lymph nodes appears stable. No mass or overt adenopathy. The esophagus is grossly normal. Lungs/Pleura: Patchy ground-glass opacity and a few patchy areas of more distinct airspace consolidation in the right upper lobe may represent a developing or clearing pneumonia. Subpleural atelectasis noted along the right major fissure. Streaky basilar atelectasis and a few patchy areas of ground-glass opacity suggesting some residual inflammation. No bronchiectasis or interstitial lung disease. No worrisome pulmonary lesions and no definite endobronchial lesions. Upper Abdomen: Surgical changes from gastric bypass surgery without complicating features. No other significant upper abdominal findings. Musculoskeletal: No breast masses, supraclavicular or axillary adenopathy. No significant bony findings. There is a chronic midthoracic compression fracture. IMPRESSION: 1. Patchy ground-glass opacity and a few adjacent patchy areas of more distinct airspace consolidation in the right upper lobe. Findings likely represent a developing or clearing pneumonia. 2. Other lower lobe patchy airspace opacities likely reflecting persistent inflammation. 3. No worrisome pulmonary lesions, pulmonary edema or pleural effusion. Electronically Signed   By: Rudie MeyerP.  Gallerani  M.D.   On: 10/15/2018 17:30   Dg Chest Port 1 View  Result Date: 10/12/2018 CLINICAL DATA:  New leading nose pneumonia prematurely discharge. EXAM: PORTABLE CHEST 1 VIEW COMPARISON:  04/05/2018 CT, 04/04/2018 CXR FINDINGS: The heart size and mediastinal contours are within normal limits. The  thoracic aorta is slightly atherosclerotic and uncoiled in appearance. No aneurysm. No effusion or pneumothorax. Subtle pulmonary opacity in the right upper lobe consistent with pneumonia. This is superimposed on mild chronic interstitial prominence. The visualized skeletal structures are unremarkable. IMPRESSION: Small focus of airspace disease in the right upper lobe consistent with pneumonia. Electronically Signed   By: Tollie Eth M.D.   On: 10/12/2018 13:43       LAB RESULTS: Basic Metabolic Panel: Recent Labs  Lab 10/15/18 0246 10/16/18 0350 10/17/18 0217  NA 140 139 139  K 3.9 3.8 3.5  CL 112* 110 107  CO2 18* 20* 24  GLUCOSE 120* 124* 115*  BUN <5* 6* 10  CREATININE 0.56 0.54 0.68  CALCIUM 7.9* 8.3* 8.3*  MG 1.9  --   --    Liver Function Tests: No results for input(s): AST, ALT, ALKPHOS, BILITOT, PROT, ALBUMIN in the last 168 hours. No results for input(s): LIPASE, AMYLASE in the last 168 hours. No results for input(s): AMMONIA in the last 168 hours. CBC: Recent Labs  Lab 10/13/18 0353 10/14/18 0433 10/16/18 0350  WBC 5.3 5.4 8.9  NEUTROABS 2.3  --   --   HGB 9.4* 9.5* 9.8*  HCT 32.2* 32.5* 33.4*  MCV 92.0 91.3 89.8  PLT 207 239 313   Cardiac Enzymes: No results for input(s): CKTOTAL, CKMB, CKMBINDEX, TROPONINI in the last 168 hours. BNP: Invalid input(s): POCBNP CBG: No results for input(s): GLUCAP in the last 168 hours.    Disposition and Follow-up: Discharge Instructions    Diet - low sodium heart healthy   Complete by:  As directed    Discharge instructions   Complete by:  As directed    Please use ventolin inhaler three times a day for next 2 days, then twice a day for next 3 days, then as needed. You can use the inhaler every 6 hours as needed for wheezing or shortness of breath.   Increase activity slowly   Complete by:  As directed        DISPOSITION: Home   DISCHARGE FOLLOW-UP Follow-up Information    August Albino, MD. Schedule an  appointment as soon as possible for a visit in 2 week(s).   Specialty:  Family Medicine Contact information: [address] [place] [address] [phone]            Time coordinating discharge:  35-minutes  Signed:   Thad Ranger M.D. [facility] 10/17/2018, 11:04 AM Pager: [phone]

## 2018-10-17 NOTE — Care Management Note (Signed)
Case Management Note  Patient Details  Name: Rachel Cole MRN: [id number] Date of Birth: [birth year]  Subjective/Objective:                    Action/Plan:  Confirmed with patient has has insurance and PCP. [facility] MD will send prescriptions to [facility]. [facility] will bring medications to patient's room prior to discharge. Bedside nurse will give patient her inpatient inhaler.   SW consulted, patient requesting cab voucher. Expected Discharge Date:  10/17/18               Expected Discharge Plan:  Home/Self Care  In-House Referral:  Clinical Social Work  Discharge planning Services  CM Consult, Medication Assistance  Post Acute Care Choice:  NA Choice offered to:  Patient  DME Arranged:  N/A DME Agency:  NA  HH Arranged:  NA HH Agency:  NA  Status of Service:  Completed, signed off  If discussed at [employer] Length of Stay Meetings, dates discussed:    Additional Comments:  Kingsley PlanWile, Rachel Goodreau Marie, RN 10/17/2018, 10:31 AM

## 2018-10-17 NOTE — Progress Notes (Signed)
Pt to leave ambulatory to front, [place]

## 2019-01-05 IMAGING — DX DG CHEST 1V PORT
1 series · 1 of 1 positions shown · non-contrast
Comparison: 04/05/2018 CT, 04/04/2018 CXR

CLINICAL DATA: New leading nose pneumonia prematurely discharge.

EXAM:
PORTABLE CHEST 1 VIEW

[chest ap]
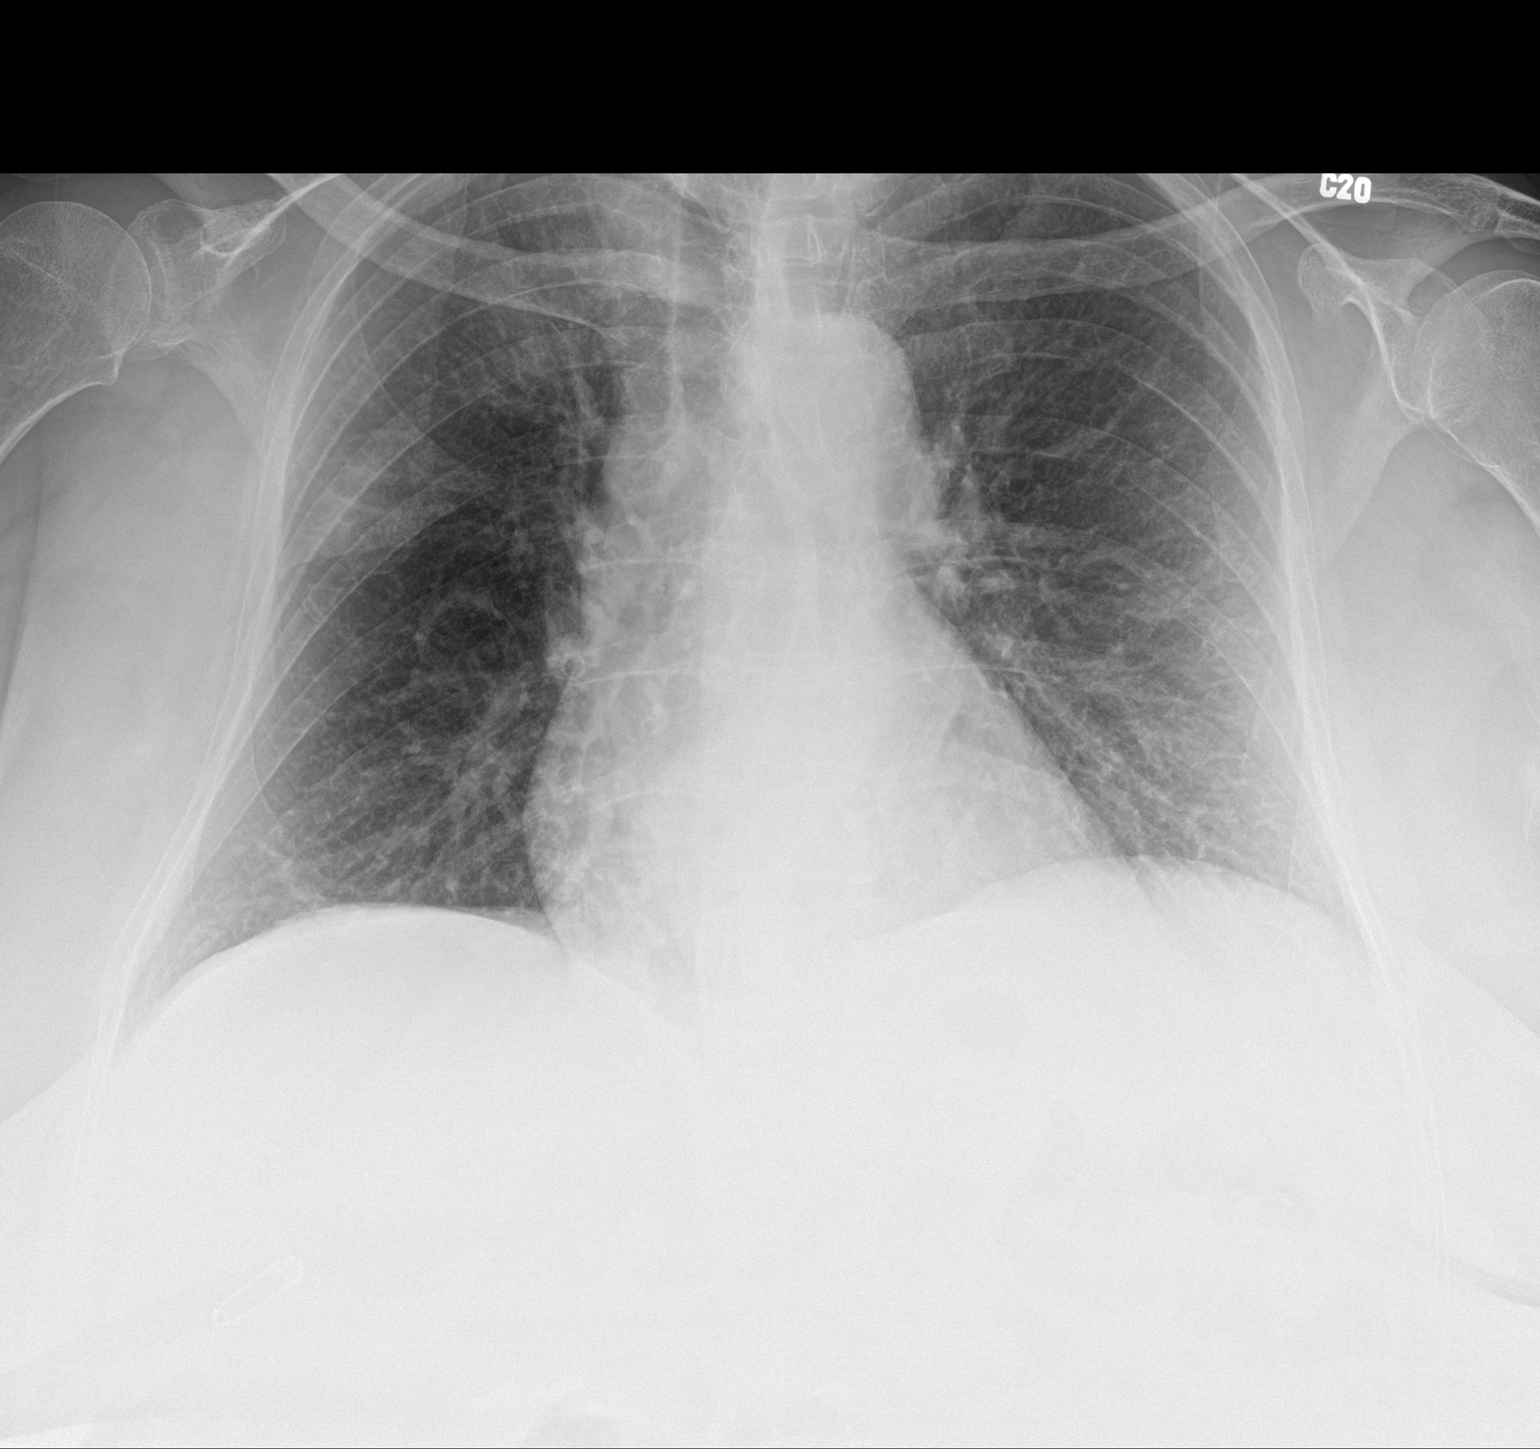

[1 of 1 positions shown; findings below may reference images not displayed]

FINDINGS: The heart size and mediastinal contours are within normal limits.
The thoracic aorta is slightly atherosclerotic and uncoiled in
appearance. No aneurysm. No effusion or pneumothorax. Subtle
pulmonary opacity in the right upper lobe consistent with pneumonia.
This is superimposed on mild chronic interstitial prominence.. The
visualized skeletal structures are unremarkable.
IMPRESSION: Small focus of airspace disease in the right upper lobe consistent
with pneumonia.

## 2019-01-24 ENCOUNTER — Emergency Department (HOSPITAL_COMMUNITY): Payer: 59

## 2019-01-24 ENCOUNTER — Encounter (HOSPITAL_COMMUNITY): Payer: Self-pay | Admitting: Emergency Medicine

## 2019-01-24 ENCOUNTER — Other Ambulatory Visit: Payer: Self-pay

## 2019-01-24 ENCOUNTER — Emergency Department (HOSPITAL_COMMUNITY)
Admission: EM | Admit: 2019-01-24 | Discharge: 2019-01-24 | Disposition: A | Discharge status: Home / Self Care | Payer: 59 | Attending: Emergency Medicine | Admitting: Emergency Medicine

## 2019-01-24 DIAGNOSIS — R0602 Shortness of breath: Secondary | ICD-10-CM | POA: Diagnosis present

## 2019-01-24 LAB — BASIC METABOLIC PANEL
Anion gap: 13 (ref 5–15)
Anion gap: 13 (ref 5–15)
BUN: 7 mg/dL — ABNORMAL LOW (ref 8–23)
BUN: 6 mg/dL — ABNORMAL LOW (ref 8–23)
CO2: 14 mmol/L — AB (ref 22–32)
CO2: 19 mmol/L — ABNORMAL LOW (ref 22–32)
Calcium: 8.6 mg/dL — ABNORMAL LOW (ref 8.9–10.3)
Calcium: 8.3 mg/dL — ABNORMAL LOW (ref 8.9–10.3)
Chloride: 109 mmol/L (ref 98–111)
Chloride: 108 mmol/L (ref 98–111)
Creatinine, Ser: 0.64 mg/dL (ref 0.44–1.00)
Creatinine, Ser: 0.77 mg/dL (ref 0.44–1.00)
GFR calc Af Amer: >60 mL/min (ref >60)
GFR calc non Af Amer: >60 mL/min (ref >60)
Glucose, Bld: 114 mg/dL — ABNORMAL HIGH (ref 70–99)
Glucose, Bld: 148 mg/dL — ABNORMAL HIGH (ref 70–99)
POTASSIUM: 3.7 mmol/L (ref 3.5–5.1)
Potassium: 4 mmol/L (ref 3.5–5.1)
Sodium: 136 mmol/L (ref 135–145)
Sodium: 140 mmol/L (ref 135–145)

## 2019-01-24 LAB — CBC WITH DIFFERENTIAL/PLATELET
Abs Immature Granulocytes: 0.03 10*3/uL (ref 0.00–0.07)
Basophils Absolute: 0.1 10*3/uL (ref 0.0–0.1)
Basophils Relative: 1 %
Eosinophils Absolute: 0.2 10*3/uL (ref 0.0–0.5)
Eosinophils Relative: 3 %
HCT: 36.9 % (ref 36.0–46.0)
Hemoglobin: 11.2 g/dL — ABNORMAL LOW (ref 12.0–15.0)
Immature Granulocytes: 0 %
Lymphocytes Relative: 35 %
Lymphs Abs: 3 10*3/uL (ref 0.7–4.0)
MCH: 27.8 pg (ref 26.0–34.0)
MCHC: 30.4 g/dL (ref 30.0–36.0)
MCV: 91.6 fL (ref 80.0–100.0)
Monocytes Absolute: 0.8 10*3/uL (ref 0.1–1.0)
Monocytes Relative: 10 %
NRBC: 0 % (ref 0.0–0.2)
Neutro Abs: 4.3 10*3/uL (ref 1.7–7.7)
Neutrophils Relative %: 51 %
Platelets: 375 10*3/uL (ref 150–400)
RBC: 4.03 MIL/uL (ref 3.87–5.11)
RDW: 17.4 % — ABNORMAL HIGH (ref 11.5–15.5)
WBC: 8.4 10*3/uL (ref 4.0–10.5)

## 2019-01-24 MED ORDER — HYDROCORTISONE NA SUCCINATE PF 100 MG IJ SOLR: 200.0000 mg | Freq: Once | INTRAMUSCULAR | Status: DC

## 2019-01-24 MED ORDER — LORAZEPAM 2 MG/ML IJ SOLN
0.5000 mg | Freq: Once | INTRAMUSCULAR | Status: AC
  Administered 2019-01-24: 0.5 mg via INTRAVENOUS
  Filled 2019-01-24: qty 1

## 2019-01-24 MED ORDER — LEVOFLOXACIN 750 MG PO TABS
750.0000 mg | ORAL_TABLET | Freq: Once | ORAL | Status: AC
  Administered 2019-01-24: 750 mg via ORAL
  Filled 2019-01-24: qty 1

## 2019-01-24 MED ORDER — DIPHENHYDRAMINE HCL 25 MG PO CAPS
50.0000 mg | ORAL_CAPSULE | Freq: Once | ORAL | Status: AC
  Administered 2019-01-24: 50 mg via ORAL
  Filled 2019-01-24: qty 2

## 2019-01-24 MED ORDER — METHYLPREDNISOLONE SODIUM SUCC 125 MG IJ SOLR
125.0000 mg | Freq: Once | INTRAMUSCULAR | Status: AC
  Administered 2019-01-24: 125 mg via INTRAVENOUS
  Filled 2019-01-24: qty 2

## 2019-01-24 MED ORDER — SODIUM CHLORIDE 0.9 % IV BOLUS
1000.0000 mL | Freq: Once | INTRAVENOUS | Status: AC
  Administered 2019-01-24: 1000 mL via INTRAVENOUS

## 2019-01-24 MED ORDER — MAGNESIUM SULFATE 2 GM/50ML IV SOLN
2.0000 g | INTRAVENOUS | Status: AC
  Administered 2019-01-24: 2 g via INTRAVENOUS
  Filled 2019-01-24: qty 50

## 2019-01-24 MED ORDER — ALBUTEROL SULFATE (2.5 MG/3ML) 0.083% IN NEBU
5.0000 mg | INHALATION_SOLUTION | Freq: Once | RESPIRATORY_TRACT | Status: AC
  Administered 2019-01-24: 5 mg via RESPIRATORY_TRACT
  Filled 2019-01-24: qty 6

## 2019-01-24 MED ORDER — IOHEXOL 350 MG/ML SOLN
75.0000 mL | Freq: Once | INTRAVENOUS | Status: AC | PRN
  Administered 2019-01-24: 75 mL via INTRAVENOUS

## 2019-01-24 MED ORDER — IPRATROPIUM BROMIDE 0.02 % IN SOLN
0.5000 mg | Freq: Once | RESPIRATORY_TRACT | Status: AC
  Administered 2019-01-24: 0.5 mg via RESPIRATORY_TRACT
  Filled 2019-01-24: qty 2.5

## 2019-01-24 NOTE — ED Notes (Signed)
Pt verbalized understanding of d/c instructions and has no further questions, VSS, NAD.  

## 2019-01-24 NOTE — ED Provider Notes (Signed)
[facility] EMERGENCY DEPARTMENT Provider Note   CSN: [id number] Arrival date & time: 01/24/19  2355    History   Chief Complaint Chief Complaint  Patient presents with  . Shortness of Breath    HPI Rachel Cole is a 63 y.o. female.    63 year old female with a history of MVP, obesity status post Roux-en-Y gastric bypass presents to the emergency department for shortness of breath.  She states that her symptoms began yesterday morning and have been progressive and constant, worsening.  She has had associated fatigue and feels that her symptoms are consistent with when she was admitted for pneumonia in December.  She has not taken any medications prior to arrival.  She received a nebulizer in triage and feels this has slightly improved her symptoms.  No associated fevers, chest pain, leg swelling, sick contacts, recent travel.  The history is provided by the patient. No language interpreter was used.  Shortness of Breath    Past Medical History:  Diagnosis Date  . ADHD   . History of blood transfusion 2007   "low HgB"  . History of hiatal hernia    "before gastric bypass; fixed it w/that OR" (10/12/2018)  . Mitral valve prolapse   . Mitral valve prolapse   . Pneumonia 10/12/2018   "1st time" (10/12/2018)    Patient Active Problem List   Diagnosis Date Noted  . Dyspnea 10/15/2018  . Right upper lobe pneumonia (HCC) 10/12/2018  . Obesity (BMI 35.0-39.9 without comorbidity) 10/12/2018  . Impingement syndrome of right ankle 10/26/2016    Past Surgical History:  Procedure Laterality Date  . ANKLE FRACTURE SURGERY Right 07/01/2016  . CESAREAN SECTION  1987; 1990  . CHOLECYSTECTOMY  2005  . DE QUERVAIN'S RELEASE Bilateral   . FRACTURE SURGERY    . ROUX-EN-Y GASTRIC BYPASS  2005   "et repaired hiatal hernia" (10/12/2018)  . TIBIA FRACTURE SURGERY Left 1964  . TONSILLECTOMY    . TUBAL LIGATION    . VAGINAL HYSTERECTOMY       OB History   No obstetric  history on file.      Home Medications    Prior to Admission medications   Medication Sig Start Date End Date Taking? Authorizing Provider  albuterol (PROVENTIL HFA;VENTOLIN HFA) 108 (90 Base) MCG/ACT inhaler Inhale 2 puffs into the lungs every 6 (six) hours as needed for wheezing or shortness of breath. 10/17/18   Rai, Delene Ruffini, MD  loratadine (CLARITIN) 10 MG tablet Take 1 tablet (10 mg total) by mouth daily as needed for allergies or rhinitis. 10/17/18   Cathren Harsh, MD    Family History History reviewed. No pertinent family history.  Social History Social History   Tobacco Use  . Smoking status: Never Smoker  . Smokeless tobacco: Never Used  Substance Use Topics  . Alcohol use: Yes    Comment: 10/12/2018 "might have 6 drinks/year; if that"  . Drug use: Never     Allergies   Bee venom; Cat hair extract; Ivp dye [iodinated diagnostic agents]; Penicillins; and Azithromycin   Review of Systems Review of Systems  Respiratory: Positive for shortness of breath.   Ten systems reviewed and are negative for acute change, except as noted in the HPI.    Physical Exam Updated Vital Signs BP 114/69   Pulse 97   Temp 97.8 F (36.6 C) (Oral)   Resp (!) 26   Ht 5\' 3"  (1.6 m)   Wt 88.5 kg  SpO2 99%   BMI 34.54 kg/m   Physical Exam Vitals signs and nursing note reviewed.  Constitutional:      General: She is not in acute distress.    Appearance: She is well-developed. She is not diaphoretic.     Comments: Obese, nontoxic  HENT:     Head: Normocephalic and atraumatic.  Eyes:     General: No scleral icterus.    Conjunctiva/sclera: Conjunctivae normal.  Neck:     Musculoskeletal: Normal range of motion.  Cardiovascular:     Rate and Rhythm: Normal rate and regular rhythm.     Pulses: Normal pulses.  Pulmonary:     Effort: Pulmonary effort is normal. No respiratory distress.     Comments: Mild expiratory wheeze in the lower lung fields.  There is also mild  stridor with forceful inspiration.  No signs of respiratory distress.  Oxygen saturations 100% on room air. Musculoskeletal: Normal range of motion.  Skin:    General: Skin is warm and dry.     Coloration: Skin is not pale.     Findings: No erythema or rash.  Neurological:     Mental Status: She is alert and oriented to person, place, and time.  Psychiatric:        Behavior: Behavior normal.      ED Treatments / Results  Labs (all labs ordered are listed, but only abnormal results are displayed) Labs Reviewed  CBC WITH DIFFERENTIAL/PLATELET - Abnormal; Notable for the following components:      Result Value   Hemoglobin 11.2 (*)    RDW 17.4 (*)    All other components within normal limits  BASIC METABOLIC PANEL - Abnormal; Notable for the following components:   CO2 14 (*)    Glucose, Bld 114 (*)    BUN 7 (*)    Calcium 8.6 (*)    All other components within normal limits    EKG EKG Interpretation  Date/Time:  Tuesday January 24 2019 04:12:57 EDT Ventricular Rate:  94 PR Interval:  134 QRS Duration: 85 QT Interval:  392 QTC Calculation: 491 R Axis:   29 Text Interpretation:  Sinus rhythm Atrial premature complex Baseline wander in lead(s) V1 Confirmed by Nicanor Alcon, April ([id number]) on 01/24/2019 4:37:00 AM   Radiology Dg Chest 2 View  Result Date: 01/24/2019 CLINICAL DATA:  Shortness of breath EXAM: CHEST - 2 VIEW COMPARISON:  10/12/2018 FINDINGS: Mild hazy right suprahilar opacity. No edema, effusion, or pneumothorax. Normal heart size. Aortic tortuosity. IMPRESSION: Right suprahilar opacity, suspect pneumonia. Electronically Signed   By: Marnee Spring M.D.   On: 01/24/2019 04:03    Procedures Procedures (including critical care time)  Medications Ordered in ED Medications  diphenhydrAMINE (BENADRYL) capsule 50 mg (has no administration in time range)  hydrocortisone sodium succinate (SOLU-CORTEF) 100 MG injection 200 mg (has no administration in time range)   albuterol (PROVENTIL) (2.5 MG/3ML) 0.083% nebulizer solution 5 mg (5 mg Nebulization Given 01/24/19 0241)  albuterol (PROVENTIL) (2.5 MG/3ML) 0.083% nebulizer solution 5 mg (5 mg Nebulization Given 01/24/19 0304)  ipratropium (ATROVENT) nebulizer solution 0.5 mg (0.5 mg Nebulization Given 01/24/19 0304)  methylPREDNISolone sodium succinate (SOLU-MEDROL) 125 mg/2 mL injection 125 mg (125 mg Intravenous Given 01/24/19 0307)  LORazepam (ATIVAN) injection 0.5 mg (0.5 mg Intravenous Given 01/24/19 0306)  sodium chloride 0.9 % bolus 1,000 mL (0 mLs Intravenous Stopped 01/24/19 0445)  magnesium sulfate IVPB 2 g 50 mL (0 g Intravenous Stopped 01/24/19 0441)  levofloxacin (LEVAQUIN) tablet 750 mg (  750 mg Oral Given 01/24/19 0443)    4:56 AM CURB-65 is 0. Ambulatory in the ED with SpO2 92% at lowest; averaging around 96% with ambulation. Endorses symptom improvement with nebulizer treatments, Solumedrol, MgSO4.  5:32 AM Spoke with Dr. Chase Picket of radiology about pretreatment for CT scan. OK to proceed with Benadryl at 6AM and CT at 7AM in light of patient receiving  Solumedrol at 3AM.  Patient confirms she has tolerated CT contrast with this pretreatment without issue in the past.   Initial Impression / Assessment and Plan / ED Course  I have reviewed the triage vital signs and the nursing notes.  Pertinent labs & imaging results that were available during my care of the patient were reviewed by me and considered in my medical decision making (see chart for details).        63 year old female presents to the emergency department for evaluation of shortness of breath and fatigue.  She was treated and hospitalized for pneumonia in December, feels as though it never fully resolved after completing a course of doxycycline.  She has had worsening symptoms over the past 24 hours.  Noted to be mildly tachypneic while in the ED.  Had mild scattered wheeze diffusely on initial auscultation which improved  following nebulizer treatments, though patient continues to have a stridorous-like sound on inspiration.  Question whether her method of breathing is contributing to the presence of this noise.  Her x-ray today is concerning for possible pneumonia in the right suprahilar lung.  This is actually the area where patient was diagnosed with pneumonia in December.  When comparing x-rays, there is little change between these 2 sets of images.  She had a completely negative CT angiogram in May 2019.  Given persistent abnormality, will obtain CT chest with contrast.  Patient currently undergoing pretreatment given history of contrast allergy.  She has been given 1 dose of Levaquin for presumed pneumonia, which will need to be continued x 4 days if CT imaging confirms this diagnosis.  Otherwise feels breathing has slightly improved with Solu-Medrol, DuoNeb, Ativan, fluids.  She has been able to ambulate in the emergency department without hypoxia.  Patient signed out to [facility], PA-C at shift change who will follow up on CT imaging and disposition appropriately.   Final Clinical Impressions(s) / ED Diagnoses   Final diagnoses:  SOB (shortness of breath)    ED Discharge Orders    None       Antony Madura, PA-C 01/24/19 0610    Palumbo, April, MD 01/24/19 [phone]

## 2019-01-24 NOTE — ED Provider Notes (Signed)
Physical Exam  BP 127/71   Pulse (!) 104   Temp 97.8 F (36.6 C) (Oral)   Resp (!) 25   Ht 5\' 3"  (1.6 m)   Wt 88.5 kg   SpO2 98%   BMI 34.54 kg/m   Physical Exam Vitals signs and nursing note reviewed.  Constitutional:      General: She is not in acute distress.    Appearance: She is well-developed. She is not diaphoretic.  HENT:     Head: Normocephalic and atraumatic.  Eyes:     General: No scleral icterus.    Conjunctiva/sclera: Conjunctivae normal.  Neck:     Musculoskeletal: Normal range of motion.  Pulmonary:     Effort: Pulmonary effort is normal. No respiratory distress.  Skin:    Findings: No rash.  Neurological:     Mental Status: She is alert.     ED Course/Procedures     Procedures  MDM  Care handed off from previous provider PA Humes.  Please see their note for further detail.  Briefly, patient 63 year old female who presents to ED for shortness of breath.  Symptoms began yesterday and have been gradually worsening.  Symptoms are similar to when she was admitted for pneumonia in December 2019.  Chest x-ray shows possible pneumonia in the right suprahilar lung.  This is the area where she was diagnosed with pneumonia in December as well.  She was given 1 dose of Levaquin, duo nebs, Ativan with improvement in her symptoms.  Plan is to obtain CT of the chest with premedication due to contrast dye allergy and reassess.  Will disposal according to CT findings.  7:44 AM  Dg Chest 2 View  Result Date: 01/24/2019 CLINICAL DATA:  Shortness of breath EXAM: CHEST - 2 VIEW COMPARISON:  10/12/2018 FINDINGS: Mild hazy right suprahilar opacity. No edema, effusion, or pneumothorax. Normal heart size. Aortic tortuosity. IMPRESSION: Right suprahilar opacity, suspect pneumonia. Electronically Signed   By: Marnee Spring M.D.   On: 01/24/2019 04:03   Ct Angio Chest Pe W And/or Wo Contrast  Result Date: 01/24/2019 CLINICAL DATA:  Shortness of breath and wheezing EXAM: CT  ANGIOGRAPHY CHEST WITH CONTRAST TECHNIQUE: Multidetector CT imaging of the chest was performed using the standard protocol during bolus administration of intravenous contrast. Multiplanar CT image reconstructions and MIPs were obtained to evaluate the vascular anatomy. CONTRAST:  16mL OMNIPAQUE IOHEXOL 350 MG/ML SOLN COMPARISON:  Chest radiograph October 15, 2018; chest CT January 24, 2019 FINDINGS: Cardiovascular: There is no demonstrable pulmonary embolus. There is no thoracic aortic aneurysm or dissection. Visualized great vessels appear normal. There is no pericardial effusion or pericardial thickening. Mediastinum/Nodes: Thyroid appears unremarkable. There is no appreciable thoracic adenopathy. There is a fairly small hiatal hernia. Lungs/Pleura: There is no appreciable edema or consolidation there is slight atelectasis in the right upper lobe, less than on chest radiograph obtained earlier in the day. No pleural effusion or pleural thickening evident. Upper Abdomen: There is postoperative change in the upper stomach region. There is hepatic steatosis. Visualized upper abdomen structures otherwise appear normal. Musculoskeletal: No blastic or lytic bone lesions. There is chronic anterior wedging of the T6 vertebral body. No chest wall lesions evident. Review of the MIP images confirms the above findings. IMPRESSION: 1. No demonstrable pulmonary embolus. No thoracic aortic aneurysm or dissection. 2. No edema or consolidation. Slight right upper lobe atelectasis, less than on chest radiograph from earlier in the day. 3. Fairly small hiatal hernia. Postoperative change proximal stomach. 4.  No demonstrable thoracic adenopathy. 5. Hepatic steatosis. Electronically Signed   By: Bretta Bang III M.D.   On: 01/24/2019 07:30   CT of the chest with no acute findings.  No concern for PE, dissection, consolidation.  I do not feel that antibiotics are indicated at this time as there is no evidence of pneumonia on CT.   We will have her follow-up with PCP and return to ED for any severe worsening symptoms.  Patient is hemodynamically stable, in NAD, and able to ambulate in the ED. Evaluation does not show pathology that would require ongoing emergent intervention or inpatient treatment. I explained the diagnosis to the patient. Pain has been managed and has no complaints prior to discharge. Patient is comfortable with above plan and is stable for discharge at this time. All questions were answered prior to disposition. Strict return precautions for returning to the ED were discussed. Encouraged follow up with PCP.    Portions of this note were generated with Scientist, clinical (histocompatibility and immunogenetics). Dictation errors may occur despite best attempts at proofreading.     Dietrich Pates, PA-C 01/24/19 1448    Vanetta Mulders, MD 01/25/19 1312

## 2019-01-24 NOTE — ED Triage Notes (Signed)
C/O of shortness of breathe. States she was admitted for pneumonia in December and this feels the same. Pt with loud wheezes on arrival.

## 2019-05-12 ENCOUNTER — Other Ambulatory Visit: Payer: Self-pay

## 2019-05-12 ENCOUNTER — Emergency Department (HOSPITAL_COMMUNITY)
Admission: EM | Admit: 2019-05-12 | Discharge: 2019-05-13 | Disposition: A | Discharge status: Home / Self Care | Payer: 59 | Attending: Emergency Medicine | Admitting: Emergency Medicine

## 2019-05-12 ENCOUNTER — Encounter (HOSPITAL_COMMUNITY): Payer: Self-pay | Admitting: *Deleted

## 2019-05-12 DIAGNOSIS — Z79899 Other long term (current) drug therapy: Secondary | ICD-10-CM | POA: Insufficient documentation

## 2019-05-12 DIAGNOSIS — Y999 Unspecified external cause status: Secondary | ICD-10-CM | POA: Diagnosis not present

## 2019-05-12 DIAGNOSIS — H1031 Unspecified acute conjunctivitis, right eye: Secondary | ICD-10-CM | POA: Diagnosis not present

## 2019-05-12 DIAGNOSIS — Y929 Unspecified place or not applicable: Secondary | ICD-10-CM | POA: Insufficient documentation

## 2019-05-12 DIAGNOSIS — H53149 Visual discomfort, unspecified: Secondary | ICD-10-CM | POA: Insufficient documentation

## 2019-05-12 DIAGNOSIS — S0501XA Injury of conjunctiva and corneal abrasion without foreign body, right eye, initial encounter: Secondary | ICD-10-CM | POA: Diagnosis not present

## 2019-05-12 DIAGNOSIS — R51 Headache: Secondary | ICD-10-CM | POA: Insufficient documentation

## 2019-05-12 DIAGNOSIS — R0602 Shortness of breath: Secondary | ICD-10-CM | POA: Diagnosis not present

## 2019-05-12 DIAGNOSIS — X58XXXA Exposure to other specified factors, initial encounter: Secondary | ICD-10-CM | POA: Insufficient documentation

## 2019-05-12 DIAGNOSIS — Y939 Activity, unspecified: Secondary | ICD-10-CM | POA: Insufficient documentation

## 2019-05-12 DIAGNOSIS — H5711 Ocular pain, right eye: Secondary | ICD-10-CM | POA: Diagnosis present

## 2019-05-12 MED ORDER — TETRACAINE HCL 0.5 % OP SOLN
2.0000 [drp] | Freq: Once | OPHTHALMIC | Status: AC
  Administered 2019-05-13: 2 [drp] via OPHTHALMIC
  Filled 2019-05-12: qty 4

## 2019-05-12 MED ORDER — FLUORESCEIN SODIUM 1 MG OP STRP
1.0000 | ORAL_STRIP | Freq: Once | OPHTHALMIC | Status: AC
  Administered 2019-05-13: 1 via OPHTHALMIC

## 2019-05-12 NOTE — ED Triage Notes (Signed)
Pt noticed R eye redness, drainage, and pain that started yesterday. Reports difficulty seeing out of eye due to swelling and increased pain. Pt also reports exertional wheezing, no cough, with hx of pneumonia x2.

## 2019-05-12 NOTE — ED Notes (Signed)
Pt c/o headache and pain in the right eye (8/10) and asks for something for pain.

## 2019-05-12 NOTE — ED Notes (Signed)
Pt is making grunting noise when she breathes again  Hyperventilating  sats good

## 2019-05-12 NOTE — ED Notes (Signed)
The pt is upset that she has been here so long and no provider has seen her yet  C/o pain  Ice pack given to help the eye pain.

## 2019-05-12 NOTE — ED Notes (Signed)
Pt c/o a severe headache from the eye pain  She has made numerus trips to the br because of bypass surgery 6-7 years ago  Hyperventilating intermittently

## 2019-05-12 NOTE — ED Notes (Signed)
Pt to br again

## 2019-05-12 NOTE — ED Notes (Signed)
The pt was moved from [facility]  She has pain and swelling in her rt eye after she had a contact lens stuck in her eye.  She has a forced nolise when she breathes sats 98%  No distress  Walked to restroom down the hallway with no breathing difficulty

## 2019-05-12 NOTE — ED Notes (Signed)
The pt still has not been seen pt c/o severe pain in her rt eye and a headache

## 2019-05-13 MED ORDER — IBUPROFEN 400 MG PO TABS
600.0000 mg | ORAL_TABLET | Freq: Once | ORAL | Status: AC
  Administered 2019-05-13: 600 mg via ORAL
  Filled 2019-05-13: qty 1

## 2019-05-13 MED ORDER — CIPROFLOXACIN HCL 0.3 % OP SOLN
2.0000 [drp] | Freq: Once | OPHTHALMIC | Status: AC
  Administered 2019-05-13: 01:00:00 2 [drp] via OPHTHALMIC
  Filled 2019-05-13: qty 2.5

## 2019-05-13 NOTE — ED Provider Notes (Signed)
[facility] EMERGENCY DEPARTMENT Provider Note   CSN: [id number] Arrival date & time: 05/12/19  1926    History   Chief Complaint Chief Complaint  Patient presents with  . Conjunctivitis  . Shortness of Breath    HPI Rachel Cole is a 63 y.o. female presents to ER for evaluation of right eye pain that began yesterday.  Patient wears colored contacts and wears them for most of the day.  Yesterday morning felt some discomfort in her right eye and so she took it out, it started to feel better so she put another contact back in the afternoon.  She slept with her contacts in through the night and in the morning she noticed eye redness, blurred vision, photophobia, yellow drainage from the eyes, eyelash crusting and matting so she tried to take the contact out but states it was very difficult.  She tried for several minutes to take it out and she thinks she may have scraped her eye with her finger nails doing this.  She eventually got the contact out.  Since she has noticed persistent tearing in the eye, drainage, photophobia and mild redness and swelling to the upper eyelid.  No interventions.  No alleviating factors.  Since arriving to the ER she has developed a right-sided headache that is moderate, throbbing and pulsating.  She attributes this to the eye discomfort and blurred vision.  She walked to the ER from home because she did not want to drive.  Reports having some shortness of breath during her walk under the heat but states her breathing has been more labored since getting over pneumonia a few months ago. States her breathing is better now.  No fever, cough, CP.       HPI  Past Medical History:  Diagnosis Date  . ADHD   . History of blood transfusion 2007   "low HgB"  . History of hiatal hernia    "before gastric bypass; fixed it w/that OR" (10/12/2018)  . Mitral valve prolapse   . Mitral valve prolapse   . Pneumonia 10/12/2018   "1st time" (10/12/2018)     Patient Active Problem List   Diagnosis Date Noted  . Dyspnea 10/15/2018  . Right upper lobe pneumonia ([place]) 10/12/2018  . Obesity (BMI 35.0-39.9 without comorbidity) 10/12/2018  . Impingement syndrome of right ankle 10/26/2016    Past Surgical History:  Procedure Laterality Date  . ANKLE FRACTURE SURGERY Right 07/01/2016  . CESAREAN SECTION  1987; 1990  . CHOLECYSTECTOMY  2005  . DE QUERVAIN'S RELEASE Bilateral   . FRACTURE SURGERY    . ROUX-EN-Y GASTRIC BYPASS  2005   "et repaired hiatal hernia" (10/12/2018)  . TIBIA FRACTURE SURGERY Left 1964  . TONSILLECTOMY    . TUBAL LIGATION    . VAGINAL HYSTERECTOMY       OB History   No obstetric history on file.      Home Medications    Prior to Admission medications   Medication Sig Start Date End Date Taking? Authorizing Provider  albuterol (PROVENTIL HFA;VENTOLIN HFA) 108 (90 Base) MCG/ACT inhaler Inhale 2 puffs into the lungs every 6 (six) hours as needed for wheezing or shortness of breath. 10/17/18   Rai, Ripudeep K, MD  amphetamine-dextroamphetamine (ADDERALL) 20 MG tablet Take 20 mg by mouth 3 (three) times daily.    [provider]  loratadine (CLARITIN) 10 MG tablet Take 1 tablet (10 mg total) by mouth daily as needed for allergies or rhinitis. Patient not  taking: Reported on 01/24/2019 10/17/18   Cathren Harshai, Ripudeep K, MD  Vitamin D, Ergocalciferol, (DRISDOL) 1.25 MG (50000 UT) CAPS capsule Take 50,000 Units by mouth every Saturday.    [provider]    Family History No family history on file.  Social History Social History   Tobacco Use  . Smoking status: Never Smoker  . Smokeless tobacco: Never Used  Substance Use Topics  . Alcohol use: Yes    Comment: 10/12/2018 "might have 6 drinks/year; if that"  . Drug use: Never     Allergies   Bee venom, Cat hair extract, Ivp dye [iodinated diagnostic agents], Penicillins, and Azithromycin   Review of Systems Review of Systems  Eyes: Positive for  photophobia, pain, discharge, redness and visual disturbance.  Neurological: Positive for headaches.  All other systems reviewed and are negative.    Physical Exam Updated Vital Signs BP (!) 163/102 (BP Location: Right Arm)   Pulse 99   Temp 98.3 F (36.8 C) (Oral)   Resp 18   SpO2 98%   Physical Exam Vitals signs and nursing note reviewed.  Constitutional:      General: She is not in acute distress.    Appearance: She is well-developed.     Comments: NAD.  HENT:     Head: Normocephalic and atraumatic.     Comments: No temporal tenderness. No significant periorbital edema, erythema, tenderness.     Right Ear: External ear normal.     Left Ear: External ear normal.     Nose: Nose normal.  Eyes:     Intraocular pressure: Right eye pressure is 23 mmHg.     Conjunctiva/sclera:     Right eye: Right conjunctiva is injected. Chemosis present.     Pupils:     Right eye: Corneal abrasion and fluorescein uptake present.     Slit lamp exam:    Right eye: Photophobia present.     Visual Fields: Right eye visual fields normal and left eye visual fields normal.      Comments: RIGHT EYE: PERRL. +Direct hotosensitivity. No consensual photosensitivity. EOMs intact, light sensitivity and tearing with eye movements. Upper lid margin with mild edema and erythema but no tenderness, fluctuance, abscess, palpable mass, lesions.  Conjunctiva and sclera injected with prominent vessels.  No limbic flush.  IOP 23. punctuate area of fluorescein uptake directly over pupil. Visual acuity diminished compared to LEFT but patient is not wearing contacts, states at baseline she has blurred vision and cannot read at arms length. No cell or flare on slit lamp exam.   LEFT EYE: PERRL.  No direct or consensual photosensitivity. EOMs intact, painless. Upper/lower lids without erythema, edema, tenderness, warmth, palpable mass, lesions.  Conjunctiva and sclera white without prominent vessels.  No limbic flush.  No  cell or flare on slit lamp exam.   Neck:     Musculoskeletal: Normal range of motion and neck supple.  Cardiovascular:     Rate and Rhythm: Normal rate and regular rhythm.     Heart sounds: Normal heart sounds. No murmur.  Pulmonary:     Effort: Pulmonary effort is normal.     Breath sounds: Normal breath sounds. No wheezing.  Musculoskeletal: Normal range of motion.        General: No deformity.  Skin:    General: Skin is warm and dry.     Capillary Refill: Capillary refill takes less than 2 seconds.  Neurological:     Mental Status: She is alert and oriented  to person, place, and time.  Psychiatric:        Behavior: Behavior normal.        Thought Content: Thought content normal.        Judgment: Judgment normal.    ED Treatments / Results  Labs (all labs ordered are listed, but only abnormal results are displayed) Labs Reviewed - No data to display  EKG None  Radiology No results found.  Procedures Procedures (including critical care time)  Medications Ordered in ED Medications  tetracaine (PONTOCAINE) 0.5 % ophthalmic solution 2 drop (2 drops Right Eye Given 05/13/19 0013)  fluorescein ophthalmic strip 1 strip (1 strip Right Eye Given 05/13/19 0100)  ibuprofen (ADVIL) tablet 600 mg (600 mg Oral Given 05/13/19 0012)  ciprofloxacin (CILOXAN) 0.3 % ophthalmic solution 2 drop (2 drops Right Eye Given 05/13/19 0059)     Initial Impression / Assessment and Plan / ED Course  I have reviewed the triage vital signs and the nursing notes.  Pertinent labs & imaging results that were available during my care of the patient were reviewed by me and considered in my medical decision making (see chart for details).    63 year old presents with right eye redness, drainage, photophobia, blurred vision.  She wears contacts and recently slept through the night with them and thinks she scraped her eye when removing it.  Exam is consistent with likely bacterial conjunctivitis with secondary  inflammatory chemosis.  Her right upper eyelid margins are slightly erythematous and edematous but not significantly tender.  She has photosensitivity with eye movements but no significant pain with EOMs and at this time I do not think there is periorbital/orbital cellulitis.  She has no fever.  No cell or flare on slit like exam.  Although given her symptoms and inflammation she is at risk for postinfectious/traumatic iritis.  I suspect her right-sided headache is secondary to the eye discomfort, blurred vision and keeping her eye closed she has no right-sided temporal tenderness and I doubt temporal arteritis or other intracranial process such as bleed.  History and exam is not consistent with CRAO/CRVO, retinal detachment, open globe, glaucoma or endophthalmitis.  She reports some blurred vision but during visual acuity states her blurred vision is at her baseline from not wearing her glasses. She had significant improvement in her pain after tetracaine.  We will discharge with ciprofloxacin ophthalmic drops which were given to her in the ER to facilitate since she is not driving, high-dose NSAIDs, warm compresses and close follow-up with her ophthalmologist.  Return precautions discussed.  Patient is comfortable with this plan.  Final Clinical Impressions(s) / ED Diagnoses   Final diagnoses:  Acute bacterial conjunctivitis of right eye  Abrasion of right cornea, initial encounter    ED Discharge Orders    None       Jerrell MylarGibbons, Raegyn Renda J, PA-C 05/13/19 1614    Little, Ambrose Finlandachel Morgan, MD 05/13/19 1758

## 2019-05-13 NOTE — Discharge Instructions (Signed)
You were seen in the ER for right eye redness, drainage, blurred vision.  You have a tiny corneal abrasion, this is a small cut in your eye.  You also have an associated infection of your eye.  Use antibiotic drops as prescribed.  2 drops on the right eye 4 times a day.  Apply a warm rag to the eyelids to clean drainage and help with inflammation.  You can alternate ibuprofen and acetaminophen every 6-8 hours for pain.  Do not touch the left eye with dirty hands as you can cause an infection on the left eye from the right eye.  Wash her hands well.  Do not put a contact lens in the right eye until you are evaluated by your eye doctor.  You need close reevaluation by an eye doctor in the next 2 to 3 days to ensure your symptoms are improving.

## 2019-09-04 ENCOUNTER — Other Ambulatory Visit: Payer: Self-pay

## 2019-09-04 ENCOUNTER — Encounter (HOSPITAL_COMMUNITY): Payer: Self-pay | Admitting: Emergency Medicine

## 2019-09-04 ENCOUNTER — Emergency Department (HOSPITAL_COMMUNITY)
Admission: EM | Admit: 2019-09-04 | Discharge: 2019-09-04 | Disposition: A | Discharge status: Home / Self Care | Payer: 59 | Attending: Emergency Medicine | Admitting: Emergency Medicine

## 2019-09-04 DIAGNOSIS — Z91030 Bee allergy status: Secondary | ICD-10-CM | POA: Insufficient documentation

## 2019-09-04 DIAGNOSIS — K0889 Other specified disorders of teeth and supporting structures: Secondary | ICD-10-CM | POA: Diagnosis present

## 2019-09-04 DIAGNOSIS — Z79899 Other long term (current) drug therapy: Secondary | ICD-10-CM | POA: Diagnosis not present

## 2019-09-04 DIAGNOSIS — Z881 Allergy status to other antibiotic agents status: Secondary | ICD-10-CM | POA: Diagnosis not present

## 2019-09-04 DIAGNOSIS — Z880 Allergy status to penicillin: Secondary | ICD-10-CM | POA: Diagnosis not present

## 2019-09-04 DIAGNOSIS — I341 Nonrheumatic mitral (valve) prolapse: Secondary | ICD-10-CM | POA: Insufficient documentation

## 2019-09-04 MED ORDER — OXYCODONE HCL 5 MG PO TABS: 5.0000 mg | ORAL_TABLET | Freq: Four times a day (QID) | ORAL | 0 refills | Status: AC | PRN

## 2019-09-04 MED ORDER — OXYCODONE-ACETAMINOPHEN 5-325 MG PO TABS
1.0000 | ORAL_TABLET | ORAL | Status: DC | PRN
  Administered 2019-09-04: 1 via ORAL
  Filled 2019-09-04 (×2): qty 1

## 2019-09-04 NOTE — ED Notes (Signed)
Patient verbalizes understanding of discharge instructions . Opportunity for questions and answers were provided . Armband removed by staff ,Pt discharged from ED. W/C  offered at D/C  and Declined W/C at D/C and was escorted to lobby by RN.  

## 2019-09-04 NOTE — Discharge Instructions (Addendum)
Please read attached information. If you experience any new or worsening signs or symptoms please return to the emergency room for evaluation. Please follow-up with your primary care provider or specialist as discussed. Please use medication prescribed only as directed and discontinue taking if you have any concerning signs or symptoms.   °

## 2019-09-04 NOTE — ED Triage Notes (Signed)
Pt had teeth removed this morning and was told to take tylenol for pain. Pt states she is in so much pain states she is having a panic attack. PT breathing loudly. 100% on room air. When pt is talking, she is talking without difficulty.

## 2019-09-04 NOTE — ED Provider Notes (Signed)
[facility] EMERGENCY DEPARTMENT Provider Note   CSN: [id number] Arrival date & time: 09/04/19  1530     History   Chief Complaint Chief Complaint  Patient presents with  . Dental Pain  . Panic Attack    HPI Rachel Cole is a 63 y.o. female.     HPI   63 year old female presents today with complaints of dental pain.  Patient notes she had all of her top teeth removed today.  She notes she was discharged home on Tylenol as she cannot take anti-inflammatories.  She notes significant pain to the upper front gums.  She notes very minimal bleeding no specific complications other than pain.    Past Medical History:  Diagnosis Date  . ADHD   . History of blood transfusion 2007   "low HgB"  . History of hiatal hernia    "before gastric bypass; fixed it w/that OR" (10/12/2018)  . Mitral valve prolapse   . Mitral valve prolapse   . Pneumonia 10/12/2018   "1st time" (10/12/2018)    Patient Active Problem List   Diagnosis Date Noted  . Dyspnea 10/15/2018  . Right upper lobe pneumonia 10/12/2018  . Obesity (BMI 35.0-39.9 without comorbidity) 10/12/2018  . Impingement syndrome of right ankle 10/26/2016    Past Surgical History:  Procedure Laterality Date  . ANKLE FRACTURE SURGERY Right 07/01/2016  . CESAREAN SECTION  1987; 1990  . CHOLECYSTECTOMY  2005  . DE QUERVAIN'S RELEASE Bilateral   . FRACTURE SURGERY    . ROUX-EN-Y GASTRIC BYPASS  2005   "et repaired hiatal hernia" (10/12/2018)  . TIBIA FRACTURE SURGERY Left 1964  . TONSILLECTOMY    . TUBAL LIGATION    . VAGINAL HYSTERECTOMY       OB History   No obstetric history on file.      Home Medications    Prior to Admission medications   Medication Sig Start Date End Date Taking? Authorizing Provider  albuterol (PROVENTIL HFA;VENTOLIN HFA) 108 (90 Base) MCG/ACT inhaler Inhale 2 puffs into the lungs every 6 (six) hours as needed for wheezing or shortness of breath. 10/17/18   Rai, Ripudeep K, MD   amphetamine-dextroamphetamine (ADDERALL) 20 MG tablet Take 20 mg by mouth 3 (three) times daily.    [provider]  loratadine (CLARITIN) 10 MG tablet Take 1 tablet (10 mg total) by mouth daily as needed for allergies or rhinitis. Patient not taking: Reported on 01/24/2019 10/17/18   Rai, Delene Ruffini, MD  oxyCODONE (ROXICODONE) 5 MG immediate release tablet Take 1 tablet (5 mg total) by mouth every 6 (six) hours as needed for severe pain. 09/04/19   Alphonza Tramell, Tinnie Gens, PA-C  Vitamin D, Ergocalciferol, (DRISDOL) 1.25 MG (50000 UT) CAPS capsule Take 50,000 Units by mouth every Saturday.    [provider]    Family History No family history on file.  Social History Social History   Tobacco Use  . Smoking status: Never Smoker  . Smokeless tobacco: Never Used  Substance Use Topics  . Alcohol use: Yes    Comment: 10/12/2018 "might have 6 drinks/year; if that"  . Drug use: Never     Allergies   Bee venom, Cat hair extract, Ivp dye [iodinated diagnostic agents], Penicillins, and Azithromycin   Review of Systems Review of Systems  All other systems reviewed and are negative.    Physical Exam Updated Vital Signs BP 123/72 (BP Location: Left Arm)   Pulse 100   Temp 97.7 F (36.5 C) (Oral)  Resp (!) 22   SpO2 100%   Physical Exam Vitals signs and nursing note reviewed.  Constitutional:      Appearance: She is well-developed.  HENT:     Head: Normocephalic and atraumatic.     Comments: Upper dentition status post extraction with no active bleeding Eyes:     General: No scleral icterus.       Right eye: No discharge.        Left eye: No discharge.     Conjunctiva/sclera: Conjunctivae normal.     Pupils: Pupils are equal, round, and reactive to light.  Neck:     Musculoskeletal: Normal range of motion.     Vascular: No JVD.     Trachea: No tracheal deviation.  Pulmonary:     Effort: Pulmonary effort is normal.     Breath sounds: No stridor.  Neurological:      Mental Status: She is alert and oriented to person, place, and time.     Coordination: Coordination normal.  Psychiatric:        Behavior: Behavior normal.        Thought Content: Thought content normal.        Judgment: Judgment normal.      ED Treatments / Results  Labs (all labs ordered are listed, but only abnormal results are displayed) Labs Reviewed - No data to display  EKG None  Radiology No results found.  Procedures Procedures (including critical care time)  Medications Ordered in ED Medications - No data to display   Initial Impression / Assessment and Plan / ED Course  I have reviewed the triage vital signs and the nursing notes.  Pertinent labs & imaging results that were available during my care of the patient were reviewed by me and considered in my medical decision making (see chart for details).        63 year old female presents today with dental pain after having her teeth removed.  I will prescribe oxycodone, she will use Tylenol as needed for discomfort, oxycodone for severe pain, she will follow-up as an outpatient with oral surgeon,'s return precautions given.  She verbalized understanding and agreement to today's plan.  She understands risks of using narcotic pain medication.  Final Clinical Impressions(s) / ED Diagnoses   Final diagnoses:  Pain, dental    ED Discharge Orders         Ordered    oxyCODONE (ROXICODONE) 5 MG immediate release tablet  Every 6 hours PRN     09/04/19 1707           Okey Regal, PA-C 09/05/19 0932    Lajean Saver, MD 09/07/19 1401

## 2019-09-11 ENCOUNTER — Ambulatory Visit: Payer: Self-pay

## 2019-09-11 ENCOUNTER — Ambulatory Visit: Payer: 59 | Admitting: Orthopedic Surgery

## 2019-09-11 ENCOUNTER — Encounter: Payer: Self-pay | Admitting: Orthopedic Surgery

## 2019-09-11 ENCOUNTER — Other Ambulatory Visit: Payer: Self-pay

## 2019-09-11 VITALS — Ht 63.0 in | Wt 195.0 lb

## 2019-09-11 DIAGNOSIS — I872 Venous insufficiency (chronic) (peripheral): Secondary | ICD-10-CM | POA: Diagnosis not present

## 2019-09-11 DIAGNOSIS — M25571 Pain in right ankle and joints of right foot: Secondary | ICD-10-CM

## 2019-09-11 NOTE — Progress Notes (Signed)
Office Visit Note   Patient: Rachel Cole           Date of Birth: [birth year]           MRN: [id number] Visit Date: 09/11/2019              Requested by: August Albino, MD No address on file PCP: August Albino, MD  Chief Complaint  Patient presents with  . Right Ankle - Pain      HPI: Patient is a 63 year old woman who presents complaining of pain and swelling in the right leg right foot and right ankle.  Patient states she has not been very active this past week and has had increased swelling.  She is status post open reduction internal fixation of her ankle fracture and she is concerned that there may be hardware failure.  Patient is also status post right ankle arthroscopy and debridement in 2017.  Assessment & Plan: Visit Diagnoses:  1. Pain in right ankle and joints of right foot   2. Venous stasis dermatitis of right lower extremity     Plan: Recommended knee-high compression stockings size extra-large.  Discussed that if we can get the swelling down then we could consider injection of the ankle.  Discussed proper application and to ensure there are no wrinkles in the stocking.  Follow-Up Instructions: Return if symptoms worsen or fail to improve.   Ortho Exam  Patient is alert, oriented, no adenopathy, well-dressed, normal affect, normal respiratory effort. Examination patient has a good dorsalis pedis pulse she has good range of motion the ankle with about 10 degrees of dorsiflexion past neutral and no calf pain with dorsiflexion of the ankle.  Patient has no tenderness to palpation along the saphenous vein.  Her leg does have brawny skin color changes and significant pitting edema up to the tibial tubercle involving the calf and as well as swelling in the foot.  There is no pain with passive range of motion of the ankle.  Patient's calf measures 42 cm in circumference.  Imaging: Xr Ankle Complete Right  Result Date: 09/11/2019 2 view radiographs of the right ankle  shows stable hardware without failure the ankle mortise is congruent no joint space collapse.  No images are attached to the encounter.  Labs: Lab Results  Component Value Date   REPTSTATUS 10/12/2018 FINAL 10/12/2018   REPTSTATUS 10/15/2018 FINAL 10/12/2018   GRAMSTAIN  10/12/2018    ABUNDANT WBC PRESENT, PREDOMINANTLY PMN RARE SQUAMOUS EPITHELIAL CELLS PRESENT RARE GRAM POSITIVE COCCI RARE YEAST    CULT  10/12/2018    FEW Consistent with normal respiratory flora. Performed at [facility], [address], [place], [place] [address]      Lab Results  Component Value Date   ALBUMIN 3.5 04/04/2018   ALBUMIN 3.4 (L) 08/23/2017   ALBUMIN 3.4 (L) 12/21/2016    Lab Results  Component Value Date   MG 1.9 10/15/2018   No results found for: VD25OH  No results found for: PREALBUMIN CBC EXTENDED Latest Ref Rng & Units 01/24/2019 10/16/2018 10/14/2018  WBC 4.0 - 10.5 K/uL 8.4 8.9 5.4  RBC 3.87 - 5.11 MIL/uL 4.03 3.72(L) 3.56(L)  HGB 12.0 - 15.0 g/dL 11.2(L) 9.8(L) 9.5(L)  HCT 36.0 - 46.0 % 36.9 33.4(L) 32.5(L)  PLT 150 - 400 K/uL 375 313 239  NEUTROABS 1.7 - 7.7 K/uL 4.3 - -  LYMPHSABS 0.7 - 4.0 K/uL 3.0 - -     Body mass index is 34.54  kg/m.  Orders:  Orders Placed This Encounter  Procedures  . XR Ankle Complete Right   No orders of the defined types were placed in this encounter.    Procedures: No procedures performed  Clinical Data: No additional findings.  ROS:  All other systems negative, except as noted in the HPI. Review of Systems  Objective: Vital Signs: Ht 5\' 3"  (1.6 m)   Wt 195 lb (88.5 kg)   BMI 34.54 kg/m   Specialty Comments:  No specialty comments available.  PMFS History: Patient Active Problem List   Diagnosis Date Noted  . Dyspnea 10/15/2018  . Right upper lobe pneumonia 10/12/2018  . Obesity (BMI 35.0-39.9 without comorbidity) 10/12/2018  . Impingement syndrome of right ankle 10/26/2016   Past Medical History:   Diagnosis Date  . ADHD   . History of blood transfusion 2007   "low HgB"  . History of hiatal hernia    "before gastric bypass; fixed it w/that OR" (10/12/2018)  . Mitral valve prolapse   . Mitral valve prolapse   . Pneumonia 10/12/2018   "1st time" (10/12/2018)    History reviewed. No pertinent family history.  Past Surgical History:  Procedure Laterality Date  . ANKLE FRACTURE SURGERY Right 07/01/2016  . CESAREAN SECTION  1987; 1990  . CHOLECYSTECTOMY  2005  . DE QUERVAIN'S RELEASE Bilateral   . FRACTURE SURGERY    . ROUX-EN-Y GASTRIC BYPASS  2005   "et repaired hiatal hernia" (10/12/2018)  . TIBIA FRACTURE SURGERY Left 1964  . TONSILLECTOMY    . TUBAL LIGATION    . VAGINAL HYSTERECTOMY     Social History   Occupational History  . Occupation: paralegal  Tobacco Use  . Smoking status: Never Smoker  . Smokeless tobacco: Never Used  Substance and Sexual Activity  . Alcohol use: Yes    Comment: 10/12/2018 "might have 6 drinks/year; if that"  . Drug use: Never  . Sexual activity: Not Currently

## 2019-09-23 ENCOUNTER — Encounter (HOSPITAL_COMMUNITY): Payer: Self-pay | Admitting: *Deleted

## 2019-09-23 ENCOUNTER — Emergency Department (HOSPITAL_COMMUNITY)
Admission: EM | Admit: 2019-09-23 | Discharge: 2019-09-23 | Disposition: A | Discharge status: Home / Self Care | Payer: BC Managed Care – PPO | Attending: Emergency Medicine | Admitting: Emergency Medicine

## 2019-09-23 ENCOUNTER — Other Ambulatory Visit: Payer: Self-pay

## 2019-09-23 DIAGNOSIS — Y939 Activity, unspecified: Secondary | ICD-10-CM | POA: Diagnosis not present

## 2019-09-23 DIAGNOSIS — Y999 Unspecified external cause status: Secondary | ICD-10-CM | POA: Insufficient documentation

## 2019-09-23 DIAGNOSIS — F909 Attention-deficit hyperactivity disorder, unspecified type: Secondary | ICD-10-CM | POA: Insufficient documentation

## 2019-09-23 DIAGNOSIS — T162XXA Foreign body in left ear, initial encounter: Secondary | ICD-10-CM | POA: Insufficient documentation

## 2019-09-23 DIAGNOSIS — Z79899 Other long term (current) drug therapy: Secondary | ICD-10-CM | POA: Insufficient documentation

## 2019-09-23 DIAGNOSIS — X58XXXA Exposure to other specified factors, initial encounter: Secondary | ICD-10-CM | POA: Insufficient documentation

## 2019-09-23 DIAGNOSIS — Y929 Unspecified place or not applicable: Secondary | ICD-10-CM | POA: Diagnosis not present

## 2019-09-23 NOTE — ED Provider Notes (Signed)
[facility]   CSN: [id number] Arrival date & time: 09/23/19  1634     History   Chief Complaint Chief Complaint  Patient presents with  . Otalgia    HPI Rachel Cole is a 63 y.o. female.     63 year old female presenting to the emergency department for foreign body in the left ear.  Patient reports that she wears hearing aids and there is a rubber part of the hearing aid that broke off and is stuck in her ear.  She tried to get it out on her own with some tweezers but reports that she pushed it further into her ear.  She denies any significant pain, drainage, loss of hearing, tinnitus.     Past Medical History:  Diagnosis Date  . ADHD   . History of blood transfusion 2007   "low HgB"  . History of hiatal hernia    "before gastric bypass; fixed it w/that OR" (10/12/2018)  . Mitral valve prolapse   . Mitral valve prolapse   . Pneumonia 10/12/2018   "1st time" (10/12/2018)    Patient Active Problem List   Diagnosis Date Noted  . Dyspnea 10/15/2018  . Right upper lobe pneumonia 10/12/2018  . Obesity (BMI 35.0-39.9 without comorbidity) 10/12/2018  . Impingement syndrome of right ankle 10/26/2016    Past Surgical History:  Procedure Laterality Date  . ANKLE FRACTURE SURGERY Right 07/01/2016  . CESAREAN SECTION  1987; 1990  . CHOLECYSTECTOMY  2005  . DE QUERVAIN'S RELEASE Bilateral   . FRACTURE SURGERY    . ROUX-EN-Y GASTRIC BYPASS  2005   "et repaired hiatal hernia" (10/12/2018)  . TIBIA FRACTURE SURGERY Left 1964  . TONSILLECTOMY    . TUBAL LIGATION    . VAGINAL HYSTERECTOMY       OB History   No obstetric history on file.      Home Medications    Prior to Admission medications   Medication Sig Start Date End Date Taking? Authorizing Provider  albuterol (PROVENTIL HFA;VENTOLIN HFA) 108 (90 Base) MCG/ACT inhaler Inhale 2 puffs into the lungs every 6 (six) hours as needed for wheezing or shortness of  breath. 10/17/18   Rai, Ripudeep K, MD  amphetamine-dextroamphetamine (ADDERALL) 20 MG tablet Take 20 mg by mouth 3 (three) times daily.    [provider]  loratadine (CLARITIN) 10 MG tablet Take 1 tablet (10 mg total) by mouth daily as needed for allergies or rhinitis. Patient not taking: Reported on 01/24/2019 10/17/18   Rai, Delene Ruffiniipudeep K, MD  oxyCODONE (ROXICODONE) 5 MG immediate release tablet Take 1 tablet (5 mg total) by mouth every 6 (six) hours as needed for severe pain. 09/04/19   Hedges, Tinnie GensJeffrey, PA-C  Vitamin D, Ergocalciferol, (DRISDOL) 1.25 MG (50000 UT) CAPS capsule Take 50,000 Units by mouth every Saturday.    [provider]    Family History No family history on file.  Social History Social History   Tobacco Use  . Smoking status: Never Smoker  . Smokeless tobacco: Never Used  Substance Use Topics  . Alcohol use: Yes    Comment: 10/12/2018 "might have 6 drinks/year; if that"  . Drug use: Never     Allergies   Bee venom, Cat hair extract, Ivp dye [iodinated diagnostic agents], Penicillins, and Azithromycin   Review of Systems Review of Systems  Constitutional: Negative for fever.  HENT: Negative for congestion, ear discharge and ear pain.   Neurological: Negative for dizziness.  Physical Exam Updated Vital Signs BP (!) 161/92   Pulse (!) 106   Temp 97.6 F (36.4 C) (Oral)   Resp 18   Ht 5\' 1"  (1.549 m)   Wt 77.1 kg   SpO2 96%   BMI 32.12 kg/m   Physical Exam Vitals signs and nursing note reviewed.  Constitutional:      Appearance: Normal appearance.  HENT:     Head: Normocephalic.     Ears:     Comments: There is a visible gray-colored rubber foreign body in the left ear canal.  No drainage, no swelling, no tragal tenderness. Eyes:     Conjunctiva/sclera: Conjunctivae normal.  Pulmonary:     Effort: Pulmonary effort is normal.  Skin:    General: Skin is dry.  Neurological:     Mental Status: She is alert.  Psychiatric:         Mood and Affect: Mood normal.      ED Treatments / Results  Labs (all labs ordered are listed, but only abnormal results are displayed) Labs Reviewed - No data to display  EKG None  Radiology No results found.  Procedures .Foreign Body Removal  Date/Time: 09/23/2019 5:42 PM Performed by: Alveria Apley, PA-C Authorized by: Alveria Apley, PA-C  Consent: Verbal consent obtained. Consent given by: patient Patient understanding: patient states understanding of the procedure being performed Patient consent: the patient's understanding of the procedure matches consent given Procedure consent: procedure consent matches procedure scheduled Relevant documents: relevant documents present and verified Test results: test results available and properly labeled Site marked: the operative site was marked Patient identity confirmed: verbally with patient Body area: ear Location details: left ear Localization method: visualized Removal mechanism: forceps Complexity: simple 1 objects recovered. Objects recovered: Rubber piece of hearing aid Post-procedure assessment: foreign body removed Patient tolerance: patient tolerated the procedure well with no immediate complications   (including critical care time)  Medications Ordered in ED Medications - No data to display   Initial Impression / Assessment and Plan / ED Course  I have reviewed the triage vital signs and the nursing notes.  Pertinent labs & imaging results that were available during my care of the patient were reviewed by me and considered in my medical decision making (see chart for details).        Based on review of vitals, medical screening exam, lab work and/or imaging, there does not appear to be an acute, emergent etiology for the patient's symptoms. Counseled pt on good return precautions and encouraged both PCP and ED follow-up as needed.  Prior to discharge, I also discussed incidental imaging  findings with patient in detail and advised appropriate, recommended follow-up in detail.  Clinical Impression: 1. Foreign body of left ear, initial encounter     Disposition: Discharge  Prior to providing a prescription for a controlled substance, I independently reviewed the patient's recent prescription history on the [facility]. The patient had no recent or regular prescriptions and was deemed appropriate for a brief, less than 3 day prescription of narcotic for acute analgesia.  This note was prepared with assistance of Systems analyst. Occasional wrong-word or sound-a-like substitutions may have occurred due to the inherent limitations of voice recognition software.   Final Clinical Impressions(s) / ED Diagnoses   Final diagnoses:  Foreign body of left ear, initial encounter    ED Discharge Orders    None       Alveria Apley,  PA-C 09/23/19 1743    Rolan Bucco, MD 09/23/19 6045

## 2019-09-23 NOTE — ED Triage Notes (Signed)
The pt has a piece of her hearinf aid stuck in her ear since this am and she is unable to remove it  When she attempted it earlier she jujst pushed it further inside

## 2019-09-23 NOTE — Discharge Instructions (Addendum)
Thank you for allowing me to care for you today. Please return to the emergency department if you have new or worsening symptoms.   

## 2019-09-23 NOTE — ED Notes (Signed)
Patient verbalizes understanding of discharge instructions. Opportunity for questioning and answers were provided. Pt discharged from ED. 

## 2019-10-11 ENCOUNTER — Emergency Department (HOSPITAL_BASED_OUTPATIENT_CLINIC_OR_DEPARTMENT_OTHER): Payer: BC Managed Care – PPO

## 2019-10-11 ENCOUNTER — Emergency Department (HOSPITAL_COMMUNITY)
Admission: EM | Admit: 2019-10-11 | Discharge: 2019-10-11 | Discharge status: Against Medical Advice | Payer: BC Managed Care – PPO | Attending: Emergency Medicine | Admitting: Emergency Medicine

## 2019-10-11 ENCOUNTER — Other Ambulatory Visit: Payer: Self-pay

## 2019-10-11 DIAGNOSIS — R0600 Dyspnea, unspecified: Secondary | ICD-10-CM

## 2019-10-11 DIAGNOSIS — M7989 Other specified soft tissue disorders: Secondary | ICD-10-CM

## 2019-10-11 DIAGNOSIS — R609 Edema, unspecified: Secondary | ICD-10-CM | POA: Diagnosis not present

## 2019-10-11 DIAGNOSIS — R0602 Shortness of breath: Secondary | ICD-10-CM | POA: Diagnosis present

## 2019-10-11 DIAGNOSIS — Z5320 Procedure and treatment not carried out because of patient's decision for unspecified reasons: Secondary | ICD-10-CM | POA: Insufficient documentation

## 2019-10-11 DIAGNOSIS — R0609 Other forms of dyspnea: Secondary | ICD-10-CM

## 2019-10-11 DIAGNOSIS — R2241 Localized swelling, mass and lump, right lower limb: Secondary | ICD-10-CM | POA: Insufficient documentation

## 2019-10-11 DIAGNOSIS — Z79899 Other long term (current) drug therapy: Secondary | ICD-10-CM | POA: Insufficient documentation

## 2019-10-11 MED ORDER — ALBUTEROL SULFATE HFA 108 (90 BASE) MCG/ACT IN AERS
4.0000 | INHALATION_SPRAY | Freq: Once | RESPIRATORY_TRACT | Status: AC
  Administered 2019-10-11: 4 via RESPIRATORY_TRACT
  Filled 2019-10-11: qty 6.7

## 2019-10-11 NOTE — ED Notes (Signed)
Patient refused lab draw. RN made aware. 

## 2019-10-11 NOTE — ED Provider Notes (Signed)
[facility]-EMERGENCY DEPT Provider Note   CSN: [id number] Arrival date & time: 10/11/19  1640     History   Chief Complaint Chief Complaint  Patient presents with  . Shortness of Breath    HPI Rachel Cole is a 63 y.o. female.     Patient complains of shortness of breath.  She also complains of swelling in her leg  The history is provided by the patient. No language interpreter was used.  Shortness of Breath Severity:  Moderate Onset quality:  Sudden Timing:  Constant Progression:  Worsening Chronicity:  New Context: activity   Relieved by:  Nothing Associated symptoms: no abdominal pain, no chest pain, no cough, no headaches and no rash     Past Medical History:  Diagnosis Date  . ADHD   . History of blood transfusion 2007   "low HgB"  . History of hiatal hernia    "before gastric bypass; fixed it w/that OR" (10/12/2018)  . Mitral valve prolapse   . Mitral valve prolapse   . Pneumonia 10/12/2018   "1st time" (10/12/2018)    Patient Active Problem List   Diagnosis Date Noted  . Dyspnea 10/15/2018  . Right upper lobe pneumonia 10/12/2018  . Obesity (BMI 35.0-39.9 without comorbidity) 10/12/2018  . Impingement syndrome of right ankle 10/26/2016    Past Surgical History:  Procedure Laterality Date  . ANKLE FRACTURE SURGERY Right 07/01/2016  . CESAREAN SECTION  1987; 1990  . CHOLECYSTECTOMY  2005  . DE QUERVAIN'S RELEASE Bilateral   . FRACTURE SURGERY    . ROUX-EN-Y GASTRIC BYPASS  2005   "et repaired hiatal hernia" (10/12/2018)  . TIBIA FRACTURE SURGERY Left 1964  . TONSILLECTOMY    . TUBAL LIGATION    . VAGINAL HYSTERECTOMY       OB History   No obstetric history on file.      Home Medications    Prior to Admission medications   Medication Sig Start Date End Date Taking? Authorizing Provider  albuterol (PROVENTIL HFA;VENTOLIN HFA) 108 (90 Base) MCG/ACT inhaler Inhale 2 puffs into the lungs every 6 (six) hours as needed for  wheezing or shortness of breath. 10/17/18  Yes Rai, Ripudeep K, MD  amphetamine-dextroamphetamine (ADDERALL) 20 MG tablet Take 20 mg by mouth 3 (three) times daily.   Yes [provider]  SYMBICORT 160-4.5 MCG/ACT inhaler Inhale 2 puffs into the lungs daily as needed (sob and wheezing).  09/17/19  Yes [provider]  Vitamin D, Ergocalciferol, (DRISDOL) 1.25 MG (50000 UT) CAPS capsule Take 50,000 Units by mouth every Saturday.   Yes [provider]  loratadine (CLARITIN) 10 MG tablet Take 1 tablet (10 mg total) by mouth daily as needed for allergies or rhinitis. Patient not taking: Reported on 01/24/2019 10/17/18   Rai, Delene Ruffiniipudeep K, MD  oxyCODONE (ROXICODONE) 5 MG immediate release tablet Take 1 tablet (5 mg total) by mouth every 6 (six) hours as needed for severe pain. Patient not taking: Reported on 10/11/2019 09/04/19   Eyvonne MechanicHedges, Jeffrey, PA-C    Family History No family history on file.  Social History Social History   Tobacco Use  . Smoking status: Never Smoker  . Smokeless tobacco: Never Used  Substance Use Topics  . Alcohol use: Yes    Comment: 10/12/2018 "might have 6 drinks/year; if that"  . Drug use: Never     Allergies   Bee venom, Cat hair extract, Ivp dye [iodinated diagnostic agents], Penicillins, and Azithromycin   Review of  Systems Review of Systems  Constitutional: Negative for appetite change and fatigue.  HENT: Negative for congestion, ear discharge and sinus pressure.   Eyes: Negative for discharge.  Respiratory: Positive for shortness of breath. Negative for cough.   Cardiovascular: Negative for chest pain.  Gastrointestinal: Negative for abdominal pain and diarrhea.  Genitourinary: Negative for frequency and hematuria.  Musculoskeletal: Negative for back pain.  Skin: Negative for rash.  Neurological: Negative for seizures and headaches.  Psychiatric/Behavioral: Negative for hallucinations.     Physical Exam Updated Vital Signs  BP (!) 141/84   Pulse 86   Ht 5\' 1"  (1.549 m)   Wt 77 kg   SpO2 100%   BMI 32.07 kg/m   Physical Exam Vitals signs reviewed.  Constitutional:      Appearance: She is well-developed.  HENT:     Head: Normocephalic.     Mouth/Throat:     Mouth: Mucous membranes are moist.  Eyes:     General: No scleral icterus.    Conjunctiva/sclera: Conjunctivae normal.  Neck:     Musculoskeletal: Neck supple.     Thyroid: No thyromegaly.  Cardiovascular:     Rate and Rhythm: Normal rate and regular rhythm.     Heart sounds: No murmur. No friction rub. No gallop.   Pulmonary:     Breath sounds: No stridor. No wheezing or rales.  Chest:     Chest wall: No tenderness.  Abdominal:     General: There is no distension.     Tenderness: There is no abdominal tenderness. There is no rebound.  Musculoskeletal:     Comments: Mild swelling right lower leg  Lymphadenopathy:     Cervical: No cervical adenopathy.  Skin:    Findings: No erythema or rash.  Neurological:     Mental Status: She is oriented to person, place, and time.     Motor: No abnormal muscle tone.     Coordination: Coordination normal.  Psychiatric:        Behavior: Behavior normal.      ED Treatments / Results  Labs (all labs ordered are listed, but only abnormal results are displayed) Labs Reviewed - No data to display  EKG None  Radiology Vas [place] Lower Extremity Venous (dvt) (only [facility])  Result Date: 10/11/2019  Lower Venous Study Indications: Swelling, and Edema.  Comparison Study: no prior Performing Technologist: Abram Sander RVS  Examination Guidelines: A complete evaluation includes B-mode imaging, spectral Doppler, color Doppler, and power Doppler as needed of all accessible portions of each vessel. Bilateral testing is considered an integral part of a complete examination. Limited examinations for reoccurring indications may be performed as noted.   +---------+---------------+---------+-----------+----------+--------------+ RIGHT    CompressibilityPhasicitySpontaneityPropertiesThrombus Aging +---------+---------------+---------+-----------+----------+--------------+ CFV      Full           Yes      Yes                                 +---------+---------------+---------+-----------+----------+--------------+ SFJ      Full                                                        +---------+---------------+---------+-----------+----------+--------------+ FV Prox  Full                                                        +---------+---------------+---------+-----------+----------+--------------+  FV Mid   Full                                                        +---------+---------------+---------+-----------+----------+--------------+ FV DistalFull                                                        +---------+---------------+---------+-----------+----------+--------------+ PFV      Full                                                        +---------+---------------+---------+-----------+----------+--------------+ POP      Full           Yes      Yes                                 +---------+---------------+---------+-----------+----------+--------------+ PTV      Full                                                        +---------+---------------+---------+-----------+----------+--------------+ PERO                                                  Not visualized +---------+---------------+---------+-----------+----------+--------------+   +----+---------------+---------+-----------+----------+--------------+ LEFTCompressibilityPhasicitySpontaneityPropertiesThrombus Aging +----+---------------+---------+-----------+----------+--------------+ CFV Full           Yes      Yes                                  +----+---------------+---------+-----------+----------+--------------+     Summary: Right: There is no evidence of deep vein thrombosis in the lower extremity. No cystic structure found in the popliteal fossa. Left: No evidence of common femoral vein obstruction.  *See table(s) above for measurements and observations.    Preliminary     Procedures Procedures (including critical care time)  Medications Ordered in ED Medications  albuterol (VENTOLIN HFA) 108 (90 Base) MCG/ACT inhaler 4 puff (4 puffs Inhalation Given 10/11/19 1750)     Initial Impression / Assessment and Plan / ED Course  I have reviewed the triage vital signs and the nursing notes.  Pertinent labs & imaging results that were available during my care of the patient were reviewed by me and considered in my medical decision making (see chart for details).       Patient with shortness of breath.  Patient left AMA before we were able to discuss her labs and disposition Final Clinical Impressions(s) / ED Diagnoses   Final diagnoses:  Dyspnea on exertion    ED Discharge Orders    None  Bethann Berkshire, MD 10/11/19 2359

## 2019-10-11 NOTE — Progress Notes (Signed)
Lower extremity venous has been completed.   Preliminary results in CV Proc.   Abram Sander 10/11/2019 5:41 PM

## 2019-10-11 NOTE — ED Triage Notes (Signed)
Pt c/o of SOB when walking to work approx 30 min ago. Per EMS, pt dx with COVID-19 in March and has had episodes of wheezing/SOB since then. Patient at 97% on RA w/ EMS.   BP 125/71 P 94 97-100% RA

## 2020-03-27 NOTE — ED Notes (Signed)
 ED Triage Note       ED Triage Adult Entered On:  03/27/2020 20:13 EDT    Performed On:  03/27/2020 20:10 EDT by Sheryle, RN, SaraAnn               Triage   Chief Complaint :   Anita Vazquez c/o SOB x1 yr since having COVID. 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 was taking warfarin but stopped taking in Feb due to financial reasons   Numeric Rating Pain Scale :   4   [place] Mode of Arrival :   Ambulance   Infectious Disease Documentation :   Document assessment   Temperature Oral :   36.6 degC(Converted to: 97.9 degF)    Heart Rate Monitored :   81 bpm   Respiratory Rate :   20 br/min   SpO2 :   98 %   Oxygen Therapy :   Room air   Patient presentation :   None of the above   Chief Complaint or Presentation suggest infection :   No   Weight Dosing :   99.9 kg(Converted to: 220 lb 4 oz)    Height :   161 cm(Converted to: 5 ft 3 in)    Body Mass Index Dosing :   39 kg/m2   [place], RN, SaraAnn - 03/27/2020 20:10 EDT   DCP GENERIC CODE   Tracking Acuity :   3   Tracking Group :   ED [employer]   [place], RN, SaraAnn - 03/27/2020 20:10 EDT   ED General Section :   Document assessment   Pregnancy Status :   Patient denies   ED Allergies Section :   Document assessment   ED Reason for Visit Section :   Document assessment   ED Quick Assessment :   Patient appears awake, alert, oriented to baseline. Skin warm and dry. Moves all extremities. Respiration even and unlabored. Appears in no apparent distress.   Sheryle RN, SaraAnn - 03/27/2020 20:10 EDT   PTA/Triage Treatments   ED PTA Pre-Arrival Service :   [facility]   [place], [place], SaraAnn - 03/27/2020 20:10 EDT   ID Risk Screen Symptoms   Recent Travel History :   No recent travel   Close Contact with COVID-19 ID :   No   Last 14 days COVID-19 ID :   No   TB Symptom Screen :   No symptoms   C. diff Symptom/History ID :   Neither of the above   Sheryle, RN, SaraAnn - 03/27/2020 20:10 EDT   Allergies   (As Of: 03/27/2020 20:13:21 EDT)   Allergies (Active)    penicillins  Estimated Onset Date:   Unspecified ; Created By:   PRICE, RN, LYLE SAILOR; Reaction Status:   Active ; Category:   Drug ; Substance:   penicillins ; Type:   Allergy ; Severity:   Severe ; Updated By:   GRETEL RN, LYLE SAILOR; Reviewed Date:   03/27/2020 20:11 EDT        Psycho-Social   Last 3 mo, thoughts killing self/others :   Patient denies   Right click within box for Suspected Abuse policy link. :   None   Feels Safe Where Live :   Yes   [place], RN, SaraAnn - 03/27/2020 20:10 EDT   ED Reason for Visit   (As Of: 03/27/2020 20:13:21 EDT)   Problems(Active)    Alteration in comfort: pain (SNOMED  CT  :65759985 )  Name of Problem:   Alteration in comfort: pain ; Recorder:   SYSTEM,  SYSTEM; Confirmation:   Confirmed ; Classification:   Interdisciplinary ; Code:   [id number] ; Last Updated:   07/02/2016 6:20 EDT ; Life Cycle Date:   07/02/2016 ; Life Cycle Status:   Active ; Vocabulary:   SNOMED CT   ; Comments:        07/02/2016 6:20 - SYSTEM,  SYSTEM  Problem added automatically by system based on initiation of Alteration in Comfort Plan of Care      Anemia (SNOMED CT  :[id number]/w )  Name of Problem:   Anemia ; Recorder:   PRICE, RN, BROOKE N; Confirmation:   Confirmed ; Classification:   Medical ; Code:   [id number]/w ; Contributor System:   PowerChart ; Last Updated:   05/19/2016 7:01 EDT ; Life Cycle Date:   05/19/2016 ; Life Cycle Status:   Active ; Vocabulary:   SNOMED CT        At risk for infection (SNOMED CT  :786229980 )  Name of Problem:   At risk for infection ; Recorder:   SYSTEM,  SYSTEM; Confirmation:   Confirmed ; Classification:   Interdisciplinary ; Code:   [id number] ; Last Updated:   07/03/2016 18:47 EDT ; Life Cycle Date:   07/03/2016 ; Life Cycle Status:   Active ; Vocabulary:   SNOMED CT   ; Comments:        07/03/2016 18:47 - SYSTEM,  SYSTEM  Problem added automatically by system based on initiation of Risk For Infection Plan of Care      Bowel dysfunction (SNOMED  CT  :646865986 )  Name of Problem:   Bowel dysfunction ; Recorder:   SYSTEM,  SYSTEM; Confirmation:   Confirmed ; Classification:   Interdisciplinary ; Code:   [id number] ; Last Updated:   07/03/2016 18:47 EDT ; Life Cycle Date:   07/03/2016 ; Life Cycle Status:   Active ; Vocabulary:   SNOMED CT   ; Comments:        07/03/2016 18:47 - SYSTEM,  SYSTEM  Problem added automatically by system based on initiation of Bowel Dysfunction Plan of Care      Impaired tissue integrity (SNOMED CT  :[id number] )  Name of Problem:   Impaired tissue integrity ; Recorder:   SYSTEM,  SYSTEM; Confirmation:   Confirmed ; Classification:   Interdisciplinary ; Code:   [id number] ; Last Updated:   07/02/2016 6:20 EDT ; Life Cycle Date:   07/02/2016 ; Life Cycle Status:   Active ; Vocabulary:   SNOMED CT   ; Comments:        07/02/2016 6:20 - SYSTEM,  SYSTEM  Problem added automatically by system based on initiation of Impaired Tissue Integrity Plan of Care        Diagnoses(Active)    Shortness of breath  Date:   03/27/2020 ; Diagnosis Type:   Reason For Visit ; Confirmation:   Complaint of ; Clinical Dx:   Shortness of breath ; Classification:   Medical ; Clinical Service:   Non-Specified ; Code:   PNED ; Probability:   0 ; Diagnosis Code:   [id number]

## 2020-03-27 NOTE — ED Notes (Signed)
ED Triage Note       ED Secondary Triage Entered On:  03/27/2020 20:14 EDT    Performed On:  03/27/2020 20:13 EDT by Ouida Sills, RN, SaraAnn               General Information   Barriers to Learning :   None evident   ED Home Meds Section :   Document assessment   [facility] ED Fall Risk Section :   Document assessment   ED Advance Directives Section :   Document assessment   ED Palliative Screen :   N/A (prefilled for <65yo)   Marcos Eke - 03/27/2020 20:13 EDT   (As Of: 03/27/2020 20:14:02 EDT)   Problems(Active)    Alteration in comfort: pain (SNOMED CT  :42683419 )  Name of Problem:   Alteration in comfort: pain ; Recorder:   SYSTEM,  SYSTEM; Confirmation:   Confirmed ; Classification:   Interdisciplinary ; Code:   [id number] ; Last Updated:   07/02/2016 6:20 EDT ; Life Cycle Date:   07/02/2016 ; Life Cycle Status:   Active ; Vocabulary:   SNOMED CT   ; Comments:        07/02/2016 6:20 - SYSTEM,  SYSTEM  Problem added automatically by system based on initiation of Alteration in Comfort Plan of Care      Anemia (SNOMED CT  :ACSYtQDsdrRUkXtoqf79/w )  Name of Problem:   Anemia ; Recorder:   PRICE, RN, BROOKE N; Confirmation:   Confirmed ; Classification:   Medical ; Code:   ACSYtQDsdrRUkXtoqf79/w ; Contributor System:   Dietitian ; Last Updated:   05/19/2016 7:01 EDT ; Life Cycle Date:   05/19/2016 ; Life Cycle Status:   Active ; Vocabulary:   SNOMED CT        At risk for infection (SNOMED CT  :921194174 )  Name of Problem:   At risk for infection ; Recorder:   SYSTEM,  SYSTEM; Confirmation:   Confirmed ; Classification:   Interdisciplinary ; Code:   [id number] ; Last Updated:   07/03/2016 18:47 EDT ; Life Cycle Date:   07/03/2016 ; Life Cycle Status:   Active ; Vocabulary:   SNOMED CT   ; Comments:        07/03/2016 18:47 - SYSTEM,  SYSTEM  Problem added automatically by system based on initiation of Risk For Infection Plan of Care      Bowel dysfunction (SNOMED CT  :631497026 )  Name of Problem:   Bowel dysfunction ; Recorder:    SYSTEM,  SYSTEM; Confirmation:   Confirmed ; Classification:   Interdisciplinary ; Code:   [id number] ; Last Updated:   07/03/2016 18:47 EDT ; Life Cycle Date:   07/03/2016 ; Life Cycle Status:   Active ; Vocabulary:   SNOMED CT   ; Comments:        07/03/2016 18:47 - SYSTEM,  SYSTEM  Problem added automatically by system based on initiation of Bowel Dysfunction Plan of Care      Impaired tissue integrity (SNOMED CT  :77412878 )  Name of Problem:   Impaired tissue integrity ; Recorder:   SYSTEM,  SYSTEM; Confirmation:   Confirmed ; Classification:   Interdisciplinary ; Code:   [id number] ; Last Updated:   07/02/2016 6:20 EDT ; Life Cycle Date:   07/02/2016 ; Life Cycle Status:   Active ; Vocabulary:   SNOMED CT   ; Comments:        07/02/2016 6:20 - SYSTEM,  SYSTEM  Problem  added automatically by system based on initiation of Impaired Tissue Integrity Plan of Care        Diagnoses(Active)    Shortness of breath  Date:   03/27/2020 ; Diagnosis Type:   Reason For Visit ; Confirmation:   Complaint of ; Clinical Dx:   Shortness of breath ; Classification:   Medical ; Clinical Service:   Non-Specified ; Code:   PNED ; Probability:   0 ; Diagnosis Code:   [id number]             -    Procedure History   (As Of: 03/27/2020 20:14:02 EDT)     Anesthesia Minutes:   0 ; Procedure Name:   Gastric bypass operation ; Procedure Minutes:   0            Anesthesia Minutes:   0 ; Procedure Name:   C section delivery ; Procedure Minutes:   0            Procedure Dt/Tm:   07/01/2016 17:44:00 EDT ; Location:   [facility] ; Provider:   Matthew Folks; Anesthesia Type:   General ; :   Colonel Bald; Anesthesia Minutes:   0 ; Procedure Name:   Ankle ORIF (Right, Ankle) ; Procedure Minutes:   11 ; Comments:     07/01/2016 18:47 EDT - Willaim Rayas, RN, Gaston Islam  auto-populated from documented surgical case ; Clinical Service:   Surgery            Anesthesia Minutes:   0 ; Procedure Name:   Cholecystectomy ; Procedure  Minutes:   0            [facility] Fall Risk Assessment Tool   Hx of falling last 3 months ED Fall :   No   Patient confused or disoriented ED Fall :   No   Patient intoxicated or sedated ED Fall :   No   Patient impaired gait ED Fall :   No   Use a mobility assistance device ED Fall :   No   Patient altered elimination ED Fall :   No   [facility] ED Fall Score :   0    Talbert Nan - 03/27/2020 20:13 EDT   ED Advance Directive   Advance Directive :   No   Willey Blade RN, June Leap - 03/27/2020 20:13 EDT   Med Hx   Medication List   (As Of: 03/27/2020 20:14:02 EDT)   No Known Home Medications     Willey Blade, RN, June Leap - 03/27/2020 20:13:51      Home Meds    LORazepam  :   LORazepam ; Status:   Completed ; Ordered As Mnemonic:   Ativan 1 mg oral tablet ; Simple Display Line:   1 mg, 1 tabs, Oral, TID, PRN: anxiety, 0 Refill(s) ; Ordering Provider:   Faustino Congress; Catalog Code:   LORazepam ; Order Dt/Tm:   07/12/2016 18:05:15 EDT          oxyCODONE  :   oxyCODONE ; Status:   Completed ; Ordered As Mnemonic:   oxyCODONE 5 mg oral tablet ; Simple Display Line:   20 mg, 4 tabs, Oral, q6hr, PRN: moderate pain (4-7), 0 Refill(s) ; Ordering Provider:   Faustino Congress; Catalog Code:   oxyCODONE ; Order Dt/Tm:   07/12/2016 18:05:51 EDT

## 2020-03-27 NOTE — ED Notes (Signed)
ED Patient Education Note     Patient Education Materials Follows:  Allergy     Shortness of Breath    Shortness of breath means you have trouble breathing. Shortness of breath needs medical care right away.      HOME CARE     Do not smoke.      Avoid being around chemicals or things (paint fumes, dust) that may bother your breathing.      Rest as needed. Slowly begin your normal activities.     Only take medicines as told by your doctor.     Keep all doctor visits as told.    GET HELP RIGHT AWAY IF:     Your shortness of breath gets worse.     You feel lightheaded, pass out (faint), or have a cough that is not helped by medicine.     You cough up blood.     You have pain with breathing.     You have pain in your chest, arms, shoulders, or belly (abdomen).     You have a fever.     You cannot walk up stairs or exercise the way you normally do.     You do not get better in the time expected.     You have a hard time doing normal activities even with rest.     You have problems with your medicines.     You have any new symptoms.    MAKE SURE YOU:     Understand these instructions.     Will watch your condition.     Will get help right away if you are not doing well or get worse.    This information is not intended to replace advice given to you by your health care provider. Make sure you discuss any questions you have with your health care provider.    Document Released: 04/13/2008 Document Revised: 10/31/2013 Document Reviewed: 01/11/2012  Elsevier Interactive Patient Education ?2016 Elsevier Inc.      Behavioral Health     Alcohol Use Disorder    Alcohol use disorder is a mental disorder. It is not a one-time incident of heavy drinking. Alcohol use disorder is the excessive and uncontrollable use of alcohol over time that leads to problems with functioning in one or more areas of daily living. People with this disorder risk harming themselves and others when they drink to excess. Alcohol use disorder also can cause  other mental disorders, such as mood and anxiety disorders, and serious physical problems. People with alcohol use disorder often misuse other drugs.     Alcohol use disorder is common and widespread. Some people with this disorder drink alcohol to cope with or escape from negative life events. Others drink to relieve chronic pain or symptoms of mental illness. People with a family history of alcohol use disorder are at higher risk of losing control and using alcohol to excess.     Drinking too much alcohol can cause injury, accidents, and health problems. One drink can be too much when you are:     Working.      Pregnant or      breastfeeding.      Taking      medicines. Ask your doctor.      Driving or      planning to drive.     SYMPTOMS    Signs and symptoms of alcohol use disorder may include the following:      Consumption of?alcohol  in?larger amounts or over a longer period of time than intended.     Multiple unsuccessful attempts to cut?down or control alcohol use. ?     A great deal of time spent obtaining alcohol, using alcohol, or recovering from the effects of alcohol (hangover).     A strong desire or urge to use alcohol (cravings). ?     Continued use of alcohol despite problems at work, school, or home because of alcohol use. ?     Continued use of alcohol despite problems in relationships because of alcohol use.     Continued use of alcohol in situations when it is physically hazardous, such as driving a car.      Continued use of alcohol despite awareness of a physical or psychological problem that is likely related to alcohol use. Physical problems related to alcohol use can involve the brain, heart, liver, stomach, and intestines. Psychological problems related to alcohol use include intoxication, depression, anxiety, psychosis, delirium, and dementia. ?     The need for increased amounts of alcohol to achieve the same desired effect, or a decreased effect from the consumption of the same amount  of alcohol (tolerance).     Withdrawal symptoms upon reducing or stopping alcohol use, or alcohol use to reduce or avoid withdrawal symptoms. Withdrawal symptoms include:     ? Racing heart.    ? Hand tremor.    ? Difficulty sleeping.    ? Nausea.    ? Vomiting.    ? Hallucinations.    ? Restlessness.    ? Seizures.     DIAGNOSIS    Alcohol use disorder is diagnosed through an assessment by your health care provider. Your health care provider may start by asking three or four questions to screen for excessive or problematic alcohol use. To confirm a diagnosis of alcohol use disorder, at least two symptoms must be present within a 48-month period. The severity of alcohol use disorder depends on the number of symptoms:     Mild?two or three.     Moderate?four or five.     Severe?six or more.    Your health care provider may perform a physical exam or use results from lab tests to see if you have physical problems resulting from alcohol use. Your health care provider may refer you to a mental health professional for evaluation.    TREATMENT    Some people with alcohol use disorder are able to reduce their alcohol use to low-risk levels. Some people with alcohol use disorder need to quit drinking alcohol. When necessary, mental health professionals with specialized training in substance use treatment can help. Your health care provider can help you decide how severe your alcohol use disorder is and what type of treatment you need. The following forms of treatment are available:      Detoxification. Detoxification involves the use of prescription medicines to prevent alcohol withdrawal symptoms in the first week after quitting. This is important for people with a history of symptoms of withdrawal and for heavy drinkers who are likely to have withdrawal symptoms. Alcohol withdrawal can be dangerous and, in severe cases, cause death. Detoxification is usually provided in a hospital or in-patient substance use treatment  facility.     Counseling or talk therapy. Talk therapy is provided by substance use treatment counselors. It addresses the reasons people use alcohol and ways to keep them from drinking again. The goals of talk therapy are to help people with alcohol use  disorder find healthy activities and ways to cope with life stress, to identify and avoid triggers for alcohol use, and to handle cravings, which can cause relapse.     Medicines.?Different medicines can help treat alcohol use disorder through the following actions:    ? Decrease alcohol cravings.    ? Decrease the positive reward response felt from alcohol use.    ? Produce an uncomfortable physical reaction when alcohol is used (aversion therapy).     Support groups. Support groups are run by people who have quit drinking. They provide emotional support, advice, and guidance.     These forms of treatment are often combined. Some people with alcohol use disorder benefit from intensive combination treatment provided by specialized substance use treatment centers. Both inpatient and outpatient treatment programs are available.    This information is not intended to replace advice given to you by your health care provider. Make sure you discuss any questions you have with your health care provider.    Document Released: 12/03/2004 Document Revised: 11/16/2014 Document Reviewed: 02/02/2013  Elsevier Interactive Patient Education ?2016 Elsevier Inc.

## 2020-03-27 NOTE — ED Provider Notes (Signed)
Shortness of breath        Patient:   Anita Vazquez, Anita Vazquez            MRN: [id number]            FIN: [id number]               Age:   64 years     Sex:  Female     DOB:  1956/10/21   Associated Diagnoses:   Dyspnea; Alcohol use disorder, moderate, dependence; Alcoholic ketoacidosis   Author:   Andrew Blasius,  Timoteo Gaul      Basic Information   Time seen: Provider Seen (ST)   ED Provider/Time:    Aquilla Shambley,  Jayshun Galentine BARRON-MD / 03/27/2020 20:27  .   Additional information: Chief Complaint from Nursing Triage Note   Chief Complaint  Chief Complaint: pt c/o SOB x1 yr since having COVID. reports being told her left lung was "full of blood clots". was taking warfarin but stopped taking in Feb due to financial reasons (03/27/20 20:10:00).      History of Present Illness   The patient presents with difficulty breathing.  Patient is a 64 year old female who arrives via EMS.  Patient states that she has been dyspneic for the last year since being diagnosed with Covid.  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  She states that due to Covid, she lost her job and has been unable to afford her Coumadin.  She recently relocated here from [facility] and attempt to look for a job.  She works as a IT consultant.  Denies any chest pain.  She describes exertional dyspnea.  Normal vital signs on arrival.  Denies any headache or neck pain.  No extremity pain or skin rash.  Ambulatory.  No other complaints..        Review of Systems   Constitutional symptoms:  Negative except as documented in HPI.   Skin symptoms:  Negative except as documented in HPI.   Eye symptoms:  Negative except as documented in HPI.   ENMT symptoms:  Negative except as documented in HPI.   Respiratory symptoms:  Shortness of breath.   Cardiovascular symptoms:  Negative except as documented in HPI.   Gastrointestinal symptoms:  Negative except as documented in HPI.   Genitourinary symptoms   Musculoskeletal symptoms:  Negative except as  documented in HPI.   Neurologic symptoms:  Negative except as documented in HPI.   Psychiatric symptoms:  Negative except as documented in HPI.   Endocrine symptoms:  Negative except as documented in HPI.   Hematologic/Lymphatic symptoms:  Negative except as documented in HPI.   Allergy/immunologic symptoms:  Negative except as documented in HPI.      Health Status   Allergies:    Allergic Reactions (Selected)  Severe  Penicillins- No reactions were documented..   Medications:  (Selected)   Inpatient Medications  Ordered  Isovue-370: 100 mL, IV Contrast, Once.      Past Medical/ Family/ Social History   Surgical history:    Ankle ORIF (Right, Ankle) on 07/01/2016 at 59 Years.  Comments:  07/01/2016 18:47 EDT - Mathis Fare, RN, Ladona Horns  auto-populated from documented surgical case  Gastric bypass operation ([id number]).  C section delivery ([id number]).  Cholecystectomy ([id number])., Reviewed as documented in chart.   Family history: Reviewed as documented in chart.   Social history: Reviewed as documented in chart.   Problem list:    Active Problems (5)  Alteration in comfort: pain  Anemia   At risk for infection   Bowel dysfunction   Impaired tissue integrity   , per nurse's notes.      Physical Examination               Vital Signs   Vital Signs   2/95/6213 08:65 EDT Systolic Blood Pressure 784 mmHg  HI    Diastolic Blood Pressure 83 mmHg    Temperature Oral 36.6 degC    Heart Rate Monitored 81 bpm    Respiratory Rate 20 br/min    SpO2 98 %   .   Measurements   03/27/2020 20:13 EDT Body Mass Index est meas 38.54 kg/m2    Body Mass Index Measured 38.54 kg/m2   03/27/2020 20:10 EDT Height/Length Measured 161 cm    Weight Dosing 99.9 kg   .   Basic Oxygen Information   03/27/2020 20:10 EDT Oxygen Therapy Room air    SpO2 98 %   .   General:  Alert, no acute distress.    Skin:  Warm, dry, pink.    Head:  Normocephalic, atraumatic.    Neck:  Supple, trachea midline, no tenderness.    Eye:  Pupils are  equal, round and reactive to light, extraocular movements are intact, normal conjunctiva, vision grossly normal.    Ears, nose, mouth and throat:  Tympanic membranes clear, oral mucosa moist.    Cardiovascular:  Regular rate and rhythm, Normal peripheral perfusion.    Respiratory:  Lungs are clear to auscultation, respirations are non-labored.    Chest wall:  No tenderness, No deformity.    Back:  Nontender, Normal range of motion, Normal alignment.    Musculoskeletal:  Normal ROM, normal strength, no tenderness, no swelling, no deformity.    Gastrointestinal:  Soft, Nontender, Non distended, Normal bowel sounds.    Neurological:  Alert and oriented to person, place, time, and situation, No focal neurological deficit observed, normal sensory observed, normal motor observed.    Lymphatics:  No lymphadenopathy.   Psychiatric:  Cooperative, appropriate mood & affect, normal judgment.       Medical Decision Making   Electrocardiogram:  EKG reveals normal sinus rhythm, rate of 74.  No evidence of acute injury pattern..   Notes:  03/27/2020 21:45:04: Patient remains awake, alert and in no distress.  She is intoxicated.  She admits to drinking heavily on a very regular basis.  We will plan on replacing her thiamine and folic acid.  In regards to her decreased CO2, I suspect that she has a mild alcoholic ketoacidosis based on lack of caloric intake.  She is encouraged to increase her fruit and vegetable intake.  She is not vomiting.  At this point she is not remarkably acidotic and does not seem to require obvious admission.  No evidence of pulmonary embolus.  Chest CT is grossly normal.  Other screening lab studies are grossly normal.  She is encouraged to decrease her alcohol intake and will plan for discharge..      Reexamination/ Reevaluation   Vital signs   Basic Oxygen Information   03/27/2020 20:10 EDT Oxygen Therapy Room air    SpO2 98 %         Impression and Plan   Diagnosis   Dyspnea (ICD10-CM R06.00, Discharge,  Medical)   Alcohol use disorder, moderate, dependence (ICD10-CM F10.20, Discharge, Medical)   Alcoholic ketoacidosis (ONG29-BM E87.2, Discharge, Medical)   Plan   Condition: Improved.    Disposition: Discharged: to home.  Patient was given the following educational materials: Shortness of Breath, Easy-to-Read, Alcohol Use Disorder.    Follow up with: Follow up with primary care provider Within 1 week Make sure you are eating some food for calories and nutrition.  Drink plenty of nonalcoholic fluids.  Follow-up with primary care provider at your earliest convenience.  Return immediately if worse or any change.    Thank you for visiting with [place] today and trusting [facility] with your emergency health care needs.  Unless otherwise instructed, you should follow up with your primary care provider at your earliest convenience to discuss your ER visit, all findings, and for re-evaluation.  If you do not have a primary care provider, call [phone]) later today or tomorrow to obtain one.  If at any point in the meantime your symptoms change, or become worse, you need to seek re-evaluation in the Emergency Department.    .    Counseled: Patient, Regarding diagnosis, Regarding diagnostic results, At the time of discharge, I went back and reevaluated the patient. I repeated the exam. I carefully explained all of the test results, and answered any and all questions. Patient indicates complete understanding..    Notes: This note was created using voice recognition software and may contain typographic errors missed during final review.  The intent is to have a complete and accurate medical record, though some errors will occur due to the nature of the software and program.    As a valued partner in the safety effort, if you have noticed factual errors, please complete the health information amendment/correct form or call the [facility] Health Information Managment Office at [phone].  Armed forces training and education officer  Signed on 03/27/2020 09:48 PM EDT   ________________________________________________   Stella Bortle,  Yonis Carreon BARRON-MD               Modified by: Debera Lat on 03/27/2020 08:42 PM EDT      Modified by: Alexus Galka,  Yasamin Karel BARRON-MD on 03/27/2020 09:48 PM EDT

## 2020-03-27 NOTE — ED Notes (Signed)
ED Pre-Arrival Note        Pre-Arrival Summary    Name:  medic 55,    Current Date:  03/27/2020 20:08:27 EDT  Gender:  Female  Date of Birth:    Age:  64  Pre-Arrival Type:  EMS  ETA:  03/27/2020 20:26:00 EDT  Primary Care Physician:    Presenting Problem:  SOB, covid x2 mo ago  Pre-Arrival User:  Teena Dunk, RN, Jearld Lesch  Referring Source:    Location:  [facility]  BP:  123/80  HR:  76  O2:  98            PreArrival Communication Form  Emergency Department        Additional Patient Information:        Orders:  [    ] CBC                                            [     ] CT Head no contrast  [    ] BMP                                           [     ] CT Abdomen/Pelvis no contrast  [    ] PT/INR                                       [     ] CT Abdomen/Pelvis IV contrast, w/ oral contrast  [    ] Troponin                                   [     ] CT Abdomen/Pelvis IV contrast, no oral contrast  [    ] BNP                                            [     ] See ordersheet  [    ] CXR                                             [     ] Other:__________________________  [    ] EKG

## 2020-03-27 NOTE — ED Notes (Signed)
ED Patient Summary       ;       [facility] Emergency Department  [address] [place], [place] [address]  [phone]  Discharge Instructions (Patient)  _______________________________________     Name: Anita Vazquez, Anita Vazquez  DOB:  [birth year]                   MRN: [id number]                   FIN: [id number]  Reason For Visit: Shortness of breath; Shortness of breath; SOB,COVIDX2 MOS AGO  Final Diagnosis: Alcohol use disorder, moderate, dependence; Alcoholic ketoacidosis; Dyspnea     Visit Date: 03/27/2020 20:05:00  Address: [address] [place] [address]  Phone: [phone]     Emergency Department Providers:         Primary Physician:   BURNS, SCOTT [facility] would like to thank you for allowing [place] to assist you with your healthcare needs. The following includes patient education materials and information regarding your injury/illness.     Follow-up Instructions:  You were seen today on an emergency basis. Please contact your primary care doctor for a follow up appointment. If you received a referral to a specialist doctor, it is important you follow-up as instructed.    It is important that you call your follow-up doctor to schedule and confirm the location of your next appointment. Your doctor may practice at multiple locations. The office location of your follow-up appointment may be different to the one written on your discharge instructions.    If you do not have a primary care doctor, please call [phone] for help in finding a [facility] Provider. For help in finding a specialist doctor, please call [phone].    The [facility] "Ask a Nurse" line in staffed by Registered Nurses and is a free service to the community. We are available Monday - Friday from 8am to 5pm to answer your questions about your health. Please call [phone].    If your condition gets worse before your follow-up with your primary care doctor or  specialist, please return to the Emergency Department.      Coronavirus 2019 (COVID-19) Reminders:     Patients aged 82 and older, people with increased risk for severe COVID-19 disease, or frontline workers with increased occupational risk can make an appointment for a COVID-19 vaccine. Patients can contact their [facility] doctors' offices to schedule an appointment to receive the COVID-19 vaccine at the [facility] or send [place] an email at [email or web address]. Patients who do not have a [facility] physician can call [phone] to schedule vaccination appointments.            Scan this code with your phone camera to send an email to the address above.          Follow Up Appointments:  Primary Care Provider:      Name: PCP,  NONE      Phone:                  With: Address: When:   Follow up with primary care provider  Within 1 week   Comments:   Make sure you are eating some food for calories and nutrition. Drink plenty of nonalcoholic fluids. Follow-up with primary care provider at your earliest convenience. Return immediately  if worse or any change.     Thank you for visiting with [place] today and trusting Claretha Cooper with your emergency health care needs. Unless otherwise instructed, you should follow up with your primary care provider at your earliest convenience to discuss your ER visit, all findings, and for re-evaluation. If you do not have a primary care provider, call [phone]) later today or tomorrow to obtain one. If at any point in the meantime your symptoms change, or become worse, you need to seek re-evaluation in the Emergency Department.                 Printed Prescriptions:    Patient Education Materials:  Discharge Orders          Discharge Patient 03/27/20 21:48:00 EDT         Comment:      Alcohol Use Disorder; Shortness of Breath, Easy-to-Read     Alcohol Use Disorder    Alcohol use disorder is a mental disorder. It is not a one-time incident of  heavy drinking. Alcohol use disorder is the excessive and uncontrollable use of alcohol over time that leads to problems with functioning in one or more areas of daily living. People with this disorder risk harming themselves and others when they drink to excess. Alcohol use disorder also can cause other mental disorders, such as mood and anxiety disorders, and serious physical problems. People with alcohol use disorder often misuse other drugs.     Alcohol use disorder is common and widespread. Some people with this disorder drink alcohol to cope with or escape from negative life events. Others drink to relieve chronic pain or symptoms of mental illness. People with a family history of alcohol use disorder are at higher risk of losing control and using alcohol to excess.     Drinking too much alcohol can cause injury, accidents, and health problems. One drink can be too much when you are:     Working.      Pregnant or      breastfeeding.      Taking      medicines. Ask your doctor.      Driving or      planning to drive.     SYMPTOMS    Signs and symptoms of alcohol use disorder may include the following:      Consumption of?alcohol in?larger amounts or over a longer period of time than intended.     Multiple unsuccessful attempts to cut?down or control alcohol use. ?     A great deal of time spent obtaining alcohol, using alcohol, or recovering from the effects of alcohol (hangover).     A strong desire or urge to use alcohol (cravings). ?     Continued use of alcohol despite problems at work, school, or home because of alcohol use. ?     Continued use of alcohol despite problems in relationships because of alcohol use.     Continued use of alcohol in situations when it is physically hazardous, such as driving a car.      Continued use of alcohol despite awareness of a physical or psychological problem that is likely related to alcohol use. Physical problems related to alcohol use can involve the brain, heart,  liver, stomach, and intestines. Psychological problems related to alcohol use include intoxication, depression, anxiety, psychosis, delirium, and dementia. ?     The need for increased amounts of alcohol to achieve the same desired effect, or a decreased effect  from the consumption of the same amount of alcohol (tolerance).     Withdrawal symptoms upon reducing or stopping alcohol use, or alcohol use to reduce or avoid withdrawal symptoms. Withdrawal symptoms include:     ? Racing heart.    ? Hand tremor.    ? Difficulty sleeping.    ? Nausea.    ? Vomiting.    ? Hallucinations.    ? Restlessness.    ? Seizures.     DIAGNOSIS    Alcohol use disorder is diagnosed through an assessment by your health care provider. Your health care provider may start by asking three or four questions to screen for excessive or problematic alcohol use. To confirm a diagnosis of alcohol use disorder, at least two symptoms must be present within a 61-month period. The severity of alcohol use disorder depends on the number of symptoms:     Mild?two or three.     Moderate?four or five.     Severe?six or more.    Your health care provider may perform a physical exam or use results from lab tests to see if you have physical problems resulting from alcohol use. Your health care provider may refer you to a mental health professional for evaluation.    TREATMENT    Some people with alcohol use disorder are able to reduce their alcohol use to low-risk levels. Some people with alcohol use disorder need to quit drinking alcohol. When necessary, mental health professionals with specialized training in substance use treatment can help. Your health care provider can help you decide how severe your alcohol use disorder is and what type of treatment you need. The following forms of treatment are available:      Detoxification. Detoxification involves the use of prescription medicines to prevent alcohol withdrawal symptoms in the first week after  quitting. This is important for people with a history of symptoms of withdrawal and for heavy drinkers who are likely to have withdrawal symptoms. Alcohol withdrawal can be dangerous and, in severe cases, cause death. Detoxification is usually provided in a hospital or in-patient substance use treatment facility.     Counseling or talk therapy. Talk therapy is provided by substance use treatment counselors. It addresses the reasons people use alcohol and ways to keep them from drinking again. The goals of talk therapy are to help people with alcohol use disorder find healthy activities and ways to cope with life stress, to identify and avoid triggers for alcohol use, and to handle cravings, which can cause relapse.     Medicines.?Different medicines can help treat alcohol use disorder through the following actions:    ? Decrease alcohol cravings.    ? Decrease the positive reward response felt from alcohol use.    ? Produce an uncomfortable physical reaction when alcohol is used (aversion therapy).     Support groups. Support groups are run by people who have quit drinking. They provide emotional support, advice, and guidance.     These forms of treatment are often combined. Some people with alcohol use disorder benefit from intensive combination treatment provided by specialized substance use treatment centers. Both inpatient and outpatient treatment programs are available.    This information is not intended to replace advice given to you by your health care provider. Make sure you discuss any questions you have with your health care provider.    Document Released: 12/03/2004 Document Revised: 11/16/2014 Document Reviewed: 02/02/2013  Elsevier Interactive Patient Education ?2016 Elsevier Inc.  Shortness of Breath    Shortness of breath means you have trouble breathing. Shortness of breath needs medical care right away.      HOME CARE     Do not smoke.      Avoid being around chemicals or things (paint fumes,  dust) that may bother your breathing.      Rest as needed. Slowly begin your normal activities.     Only take medicines as told by your doctor.     Keep all doctor visits as told.    GET HELP RIGHT AWAY IF:     Your shortness of breath gets worse.     You feel lightheaded, pass out (faint), or have a cough that is not helped by medicine.     You cough up blood.     You have pain with breathing.     You have pain in your chest, arms, shoulders, or belly (abdomen).     You have a fever.     You cannot walk up stairs or exercise the way you normally do.     You do not get better in the time expected.     You have a hard time doing normal activities even with rest.     You have problems with your medicines.     You have any new symptoms.    MAKE SURE YOU:     Understand these instructions.     Will watch your condition.     Will get help right away if you are not doing well or get worse.    This information is not intended to replace advice given to you by your health care provider. Make sure you discuss any questions you have with your health care provider.    Document Released: 04/13/2008 Document Revised: 10/31/2013 Document Reviewed: 01/11/2012  Elsevier Interactive Patient Education ?2016 Elsevier Inc.         Allergy Info: penicillins     Medication Information:  [facility] ED Physicians provided you with a complete list of medications post discharge, if you have been instructed to stop taking a medication please ensure you also follow up with this information to your Primary Care Physician.  Unless otherwise noted, patient will continue to take medications as prescribed prior to the Emergency Room visit.  Any specific questions regarding your chronic medications and dosages should be discussed with your physician(s) and pharmacist.          No Known Home Medications      Medications Administered During Visit:              Medication Dose Route   iopamidol 100 mL IV Contrast   Sodium Chloride 0.9% 1000 mL IV  Piggyback   thiamine 100 mg Oral          Major Tests and Procedures:  The following procedures and tests were performed during your ED visit.  COMMON PROCEDURES%>  COMMON PROCEDURES COMMENTS%>          Laboratory Orders  Name Status Details   Add On Completed Blood, Stat, ST - Stat, Collected, 03/27/20 20:48:00 EDT, 03/27/20 20:48:00 EDT, BURNS,  SCOTT BARRON-MD, Print label Y/N, Etoh level, Draw Stat   CBCDIFF Completed Blood, Stat, ST - Stat, 03/27/20 20:28:00 EDT, 03/27/20 20:28:00 EDT, Nurse collect, BURNS,  SCOTT BARRON-MD, Print label Y/N   CMP Completed Blood, Stat, ST - Stat, 03/27/20 20:28:00 EDT, 03/27/20 20:28:00 EDT, Nurse collect, BURNS,  SCOTT BARRON-MD, Print label Y/N  COV FIA Completed Nasal Swab, Stat, ST - Stat, 03/27/20 20:40:00 EDT, 03/27/20 20:40:00 EDT, Nurse collect, BURNS,  SCOTT BARRON-MD, Print label Y/N   CPK Completed Blood, Stat, ST - Stat, 03/27/20 20:28:00 EDT, 03/27/20 20:28:00 EDT, Nurse collect, BURNS,  SCOTT BARRON-MD, Print label Y/N   Ethanol Completed Blood, Stat, ST - Stat, Collected, 03/27/20 20:31:00 EDT [id number], 03/27/20 20:31:00 EDT, Nurse collect, Venous Draw, 03/27/20 20:52:00 EDT, RH CP Login, BURNS,  SCOTT BARRON-MD, Print label Y/N, rh_accession_1, 4 mL SST PST/*B*/   Morphology Review Completed Blood, Stat, ST - Stat, 03/27/20 20:28:00 EDT, 03/27/20 20:28:00 EDT, Nurse collect, 03/27/20 20:31:00 [place]/Eastern, BURNS,  Timoteo Gaul, [id number]   NTproBNP Completed Blood, Stat, ST - Stat, 03/27/20 20:28:00 EDT, 03/27/20 20:28:00 EDT, Nurse collect, BURNS,  Timoteo Gaul, Print label Y/N   Trop T Completed Blood, Stat, ST - Stat, 03/27/20 20:28:00 EDT, 03/27/20 20:28:00 EDT, Nurse collect, BURNS,  Timoteo Gaul, Print label Y/N               Radiology Orders  Name Status Details   CT Angiography Chest w/ + w/o Contrast Completed 03/27/20 20:28:00 EDT, STAT 1 hour or less, Reason: PE suspected, high prob, Transport Mode: WHEELCHAIR, G[employer], [place]               Patient Care Orders  Name Status Details   COVID-19 Status Ordered 03/27/20 20:40:26 EDT, NOT VALID FOR pharmacy, laboratory, radiology., 03/27/20 20:40:26 EDT, COVID-19 Not Detected   DC ISO Order/Icons Ordered 03/27/20 21:16:57 EDT, 03/27/20 21:16:57 EDT   Discharge Patient Ordered 03/27/20 21:48:00 EDT   ED Assessment Adult Completed 03/27/20 20:13:22 EDT, 03/27/20 20:13:22 EDT   ED Secondary Triage Completed 03/27/20 20:13:22 EDT, 03/27/20 56:43:32 EDT   ED Triage Adult Completed 03/27/20 20:06:44 EDT, 03/27/20 20:06:44 EDT   Notify Provider Completed 03/27/20 20:40:26 EDT, This message can only be seen by Nursing, it is not visible to Pharmacy, Laboratory, or Radiology., 03/27/20 20:40:26 EDT   Patient Isolation Ordered 03/27/20 20:40:00 EDT, Contact and Airborne, Constant Indicator       ---------------------------------------------------------------------------------------------------------------------  [facility]) encourages you to self-enroll in the [facility] Patient Portal.  [facility] Patient Portal will allow you to manage your personal health information securely from your own electronic device now and in the future.  To begin your Patient Portal enrollment process, please visit [email or web address] Click on "Sign up now" under [facility].  If you find that you need additional assistance on the [facility] Patient Portal or need a copy of your medical records, please call the [facility] Medical Records Office at [phone].  Comment:

## 2020-03-27 NOTE — ED Notes (Signed)
ED Patient Education Note     Patient Education Materials Follows:  Allergy     Shortness of Breath    Shortness of breath means you have trouble breathing. Shortness of breath needs medical care right away.      HOME CARE     Do not smoke.      Avoid being around chemicals or things (paint fumes, dust) that may bother your breathing.      Rest as needed. Slowly begin your normal activities.     Only take medicines as told by your doctor.     Keep all doctor visits as told.    GET HELP RIGHT AWAY IF:     Your shortness of breath gets worse.     You feel lightheaded, pass out (faint), or have a cough that is not helped by medicine.     You cough up blood.     You have pain with breathing.     You have pain in your chest, arms, shoulders, or belly (abdomen).     You have a fever.     You cannot walk up stairs or exercise the way you normally do.     You do not get better in the time expected.     You have a hard time doing normal activities even with rest.     You have problems with your medicines.     You have any new symptoms.    MAKE SURE YOU:     Understand these instructions.     Will watch your condition.     Will get help right away if you are not doing well or get worse.    This information is not intended to replace advice given to you by your health care provider. Make sure you discuss any questions you have with your health care provider.    Document Released: 04/13/2008 Document Revised: 10/31/2013 Document Reviewed: 01/11/2012  Elsevier Interactive Patient Education ?2016 [place]     Alcohol Use Disorder    Alcohol use disorder is a mental disorder. It is not a one-time incident of heavy drinking. Alcohol use disorder is the excessive and uncontrollable use of alcohol over time that leads to problems with functioning in one or more areas of daily living. People with this disorder risk harming themselves and others when they drink to excess. Alcohol use disorder also can cause  other mental disorders, such as mood and anxiety disorders, and serious physical problems. People with alcohol use disorder often misuse other drugs.     Alcohol use disorder is common and widespread. Some people with this disorder drink alcohol to cope with or escape from negative life events. Others drink to relieve chronic pain or symptoms of mental illness. People with a family history of alcohol use disorder are at higher risk of losing control and using alcohol to excess.     Drinking too much alcohol can cause injury, accidents, and health problems. One drink can be too much when you are:     Working.      Pregnant or      breastfeeding.      Taking      medicines. Ask your doctor.      Driving or      planning to drive.     SYMPTOMS    Signs and symptoms of alcohol use disorder may include the following:      Consumption of?alcohol  in?larger amounts or over a longer period of time than intended.     Multiple unsuccessful attempts to cut?down or control alcohol use. ?     A great deal of time spent obtaining alcohol, using alcohol, or recovering from the effects of alcohol (hangover).     A strong desire or urge to use alcohol (cravings). ?     Continued use of alcohol despite problems at work, school, or home because of alcohol use. ?     Continued use of alcohol despite problems in relationships because of alcohol use.     Continued use of alcohol in situations when it is physically hazardous, such as driving a car.      Continued use of alcohol despite awareness of a physical or psychological problem that is likely related to alcohol use. Physical problems related to alcohol use can involve the brain, heart, liver, stomach, and intestines. Psychological problems related to alcohol use include intoxication, depression, anxiety, psychosis, delirium, and dementia. ?     The need for increased amounts of alcohol to achieve the same desired effect, or a decreased effect from the consumption of the same amount  of alcohol (tolerance).     Withdrawal symptoms upon reducing or stopping alcohol use, or alcohol use to reduce or avoid withdrawal symptoms. Withdrawal symptoms include:     ? Racing heart.    ? Hand tremor.    ? Difficulty sleeping.    ? Nausea.    ? Vomiting.    ? Hallucinations.    ? Restlessness.    ? Seizures.     DIAGNOSIS    Alcohol use disorder is diagnosed through an assessment by your health care provider. Your health care provider may start by asking three or four questions to screen for excessive or problematic alcohol use. To confirm a diagnosis of alcohol use disorder, at least two symptoms must be present within a 59-month period. The severity of alcohol use disorder depends on the number of symptoms:     Mild?two or three.     Moderate?four or five.     Severe?six or more.    Your health care provider may perform a physical exam or use results from lab tests to see if you have physical problems resulting from alcohol use. Your health care provider may refer you to a mental health professional for evaluation.    TREATMENT    Some people with alcohol use disorder are able to reduce their alcohol use to low-risk levels. Some people with alcohol use disorder need to quit drinking alcohol. When necessary, mental health professionals with specialized training in substance use treatment can help. Your health care provider can help you decide how severe your alcohol use disorder is and what type of treatment you need. The following forms of treatment are available:      Detoxification. Detoxification involves the use of prescription medicines to prevent alcohol withdrawal symptoms in the first week after quitting. This is important for people with a history of symptoms of withdrawal and for heavy drinkers who are likely to have withdrawal symptoms. Alcohol withdrawal can be dangerous and, in severe cases, cause death. Detoxification is usually provided in a hospital or in-patient substance use treatment  facility.     Counseling or talk therapy. Talk therapy is provided by substance use treatment counselors. It addresses the reasons people use alcohol and ways to keep them from drinking again. The goals of talk therapy are to help people with alcohol use  disorder find healthy activities and ways to cope with life stress, to identify and avoid triggers for alcohol use, and to handle cravings, which can cause relapse.     Medicines.?Different medicines can help treat alcohol use disorder through the following actions:    ? Decrease alcohol cravings.    ? Decrease the positive reward response felt from alcohol use.    ? Produce an uncomfortable physical reaction when alcohol is used (aversion therapy).     Support groups. Support groups are run by people who have quit drinking. They provide emotional support, advice, and guidance.     These forms of treatment are often combined. Some people with alcohol use disorder benefit from intensive combination treatment provided by specialized substance use treatment centers. Both inpatient and outpatient treatment programs are available.    This information is not intended to replace advice given to you by your health care provider. Make sure you discuss any questions you have with your health care provider.    Document Released: 12/03/2004 Document Revised: 11/16/2014 Document Reviewed: 02/02/2013  Elsevier Interactive Patient Education ?2016 Elsevier Inc.

## 2020-03-27 NOTE — Discharge Summary (Signed)
 ED Clinical Summary                        [facility]  [address]  [place], [place], [address]  [phone]           PERSON INFORMATION  Name: BETHANNIE, IGLEHART Age:  64 Years DOB: Mar 25, 1956   Sex: Female Language: English PCP: PCP,  NONE   Marital Status: Divorced Phone: [phone] Med Service: [facility]   MRN: [id number] Acct# [id number] Arrival: 03/27/2020 20:05:00   Visit Reason: Shortness of breath; Shortness of breath; SOB,COVIDX2 MOS AGO Acuity: 3 LOS: 000 02:42   Address:    [address] [place] [address]   Diagnosis:    Alcohol use disorder, moderate, dependence; Alcoholic ketoacidosis; Dyspnea  Medications:    Medications Administered During Visit:                Medication Dose Route   iopamidol 100 mL IV Contrast   Sodium Chloride 0.9% 1000 mL IV Piggyback   thiamine 100 mg Oral               Allergies      penicillins      Major Tests and Procedures:  The following procedures and tests were performed during your ED visit.  COMMON PROCEDURES%>  COMMON PROCEDURES COMMENTS%>                PROVIDER INFORMATION               Provider Role Assigned Sampson Langton, RN, SaraAnn ED Nurse 03/27/2020 20:09:53    BURNS, GLENDIA CIRRI ED Provider 03/27/2020 20:10:49 03/27/2020 20:14:21   BURNS, GLENDIA CIRRI ED Provider 03/27/2020 20:27:59    Mascorro, Meggin-RN ED Nurse 03/27/2020 21:32:54        Attending Physician:  DEFAULT,  DOCTOR      Admit Doc  DEFAULT,  DOCTOR     Consulting Doc       VITALS INFORMATION  Vital Sign Triage Latest   Temp Oral ORAL_1%> ORAL%>   Temp Temporal TEMPORAL_1%> TEMPORAL%>   Temp Intravascular INTRAVASCULAR_1%> INTRAVASCULAR%>   Temp Axillary AXILLARY_1%> AXILLARY%>   Temp Rectal RECTAL_1%> RECTAL%>   02 Sat 98 % 98 %   Respiratory Rate RATE_1%> RATE%>   Peripheral Pulse Rate PULSE RATE_1%> PULSE RATE%>   Apical Heart Rate HEART RATE_1%> HEART RATE%>   Blood Pressure BLOOD PRESSURE_1%>/ BLOOD PRESSURE_1%>83 mmHg BLOOD PRESSURE%> / BLOOD  PRESSURE%>83 mmHg                 Immunizations      No Immunizations Documented This Visit          DISCHARGE INFORMATION   Discharge Disposition: H Outpt-Sent Home   Discharge Location:  Home   Discharge Date and Time:  03/27/2020 22:47:36   ED Checkout Date and Time:  03/27/2020 22:47:36     DEPART REASON INCOMPLETE INFORMATION               Depart Action Incomplete Reason   Interactive View/I&O Recently assessed   Patient Understanding Recently assessed               Problems      Active           Anemia              Smoking Status      Never smoker  PATIENT EDUCATION INFORMATION  Instructions:     Alcohol Use Disorder; Shortness of Breath, Easy-to-Read     Follow up:                   With: Address: When:   Follow up with primary care provider  Within 1 week   Comments:   Make sure you are eating some food for calories and nutrition. Drink plenty of nonalcoholic fluids. Follow-up with primary care provider at your earliest convenience. Return immediately if worse or any change.     Thank you for visiting with us  today and trusting [facility] with your emergency health care needs. Unless otherwise instructed, you should follow up with your primary care provider at your earliest convenience to discuss your ER visit, all findings, and for re-evaluation. If you do not have a primary care provider, call [phone]) later today or tomorrow to obtain one. If at any point in the meantime your symptoms change, or become worse, you need to seek re-evaluation in the Emergency Department.                 ED PROVIDER DOCUMENTATION     Patient:   SHEMICKA, COHRS            MRN: [id number]            FIN: [id number]               Age:   37 years     Sex:  Female     DOB:  June 08, 1956   Associated Diagnoses:   Dyspnea; Alcohol use disorder, moderate, dependence; Alcoholic ketoacidosis   Author:   BURNS,  GLENDIA CIRRI      Basic Information   Time seen: Provider Seen (ST)   ED Provider/Time:    BURNS,  SCOTT  BARRON-MD / 03/27/2020 20:27  .   Additional information: Chief Complaint from Nursing Triage Note   Chief Complaint  Chief Complaint: pt c/o SOB x1 yr since having COVID. 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 was taking warfarin but stopped taking in Feb due to financial reasons (03/27/20 20:10:00).      History of Present Illness   The patient presents with difficulty breathing.  Patient is a 64 year old female who arrives via EMS.  Patient states that she has been dyspneic for the last year since being diagnosed with Covid.  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  She states that due to Covid, she lost her job and has been unable to afford her Coumadin.  She recently relocated here from [place] and attempt to look for a job.  She works as a IT consultant.  Denies any chest pain.  She describes exertional dyspnea.  Normal vital signs on arrival.  Denies any headache or neck pain.  No extremity pain or skin rash.  Ambulatory.  No other complaints..        Review of Systems   Constitutional symptoms:  Negative except as documented in HPI.   Skin symptoms:  Negative except as documented in HPI.   Eye symptoms:  Negative except as documented in HPI.   ENMT symptoms:  Negative except as documented in HPI.   Respiratory symptoms:  Shortness of breath.   Cardiovascular symptoms:  Negative except as documented in HPI.   Gastrointestinal symptoms:  Negative except as documented in HPI.   Genitourinary symptoms   Musculoskeletal symptoms:  Negative except as documented in  HPI.   Neurologic symptoms:  Negative except as documented in HPI.   Psychiatric symptoms:  Negative except as documented in HPI.   Endocrine symptoms:  Negative except as documented in HPI.   Hematologic/Lymphatic symptoms:  Negative except as documented in HPI.   Allergy/immunologic symptoms:  Negative except as documented in HPI.      Health Status   Allergies:    Allergic Reactions  (Selected)  Severe  Penicillins- No reactions were documented..   Medications:  (Selected)   Inpatient Medications  Ordered  Isovue-370: 100 mL, IV Contrast, Once.      Past Medical/ Family/ Social History   Surgical history:    Ankle ORIF (Right, Ankle) on 07/01/2016 at 59 Years.  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  C section delivery ([id number]).  Cholecystectomy ([id number])., Reviewed as documented in chart.   Family history: Reviewed as documented in chart.   Social history: Reviewed as documented in chart.   Problem list:    Active Problems (5)  Alteration in comfort: pain   Anemia   At risk for infection   Bowel dysfunction   Impaired tissue integrity   , per nurse's notes.      Physical Examination               Vital Signs   Vital Signs   03/27/2020 20:10 EDT Systolic Blood Pressure 149 mmHg  HI    Diastolic Blood Pressure 83 mmHg    Temperature Oral 36.6 degC    Heart Rate Monitored 81 bpm    Respiratory Rate 20 br/min    SpO2 98 %   .   Measurements   03/27/2020 20:13 EDT Body Mass Index est meas 38.54 kg/m2    Body Mass Index Measured 38.54 kg/m2   03/27/2020 20:10 EDT Height/Length Measured 161 cm    Weight Dosing 99.9 kg   .   Basic Oxygen Information   03/27/2020 20:10 EDT Oxygen Therapy Room air    SpO2 98 %   .   General:  Alert, no acute distress.    Skin:  Warm, dry, pink.    Head:  Normocephalic, atraumatic.    Neck:  Supple, trachea midline, no tenderness.    Eye:  Pupils are equal, round and reactive to light, extraocular movements are intact, normal conjunctiva, vision grossly normal.    Ears, nose, mouth and throat:  Tympanic membranes clear, oral mucosa moist.    Cardiovascular:  Regular rate and rhythm, Normal peripheral perfusion.    Respiratory:  Lungs are clear to auscultation, respirations are non-labored.    Chest wall:  No tenderness, No deformity.    Back:   Nontender, Normal range of motion, Normal alignment.    Musculoskeletal:  Normal ROM, normal strength, no tenderness, no swelling, no deformity.    Gastrointestinal:  Soft, Nontender, Non distended, Normal bowel sounds.    Neurological:  Alert and oriented to person, place, time, and situation, No focal neurological deficit observed, normal sensory observed, normal motor observed.    Lymphatics:  No lymphadenopathy.   Psychiatric:  Cooperative, appropriate mood & affect, normal judgment.       Medical Decision Making   Electrocardiogram:  EKG reveals normal sinus rhythm, rate of 74.  No evidence of acute injury pattern..   Notes:  03/27/2020 21:45:04: Patient remains awake, alert and in no distress.  She is intoxicated.  She admits to drinking  heavily on a very regular basis.  We will plan on replacing her thiamine and folic acid.  In regards to her decreased CO2, I suspect that she has a mild alcoholic ketoacidosis based on lack of caloric intake.  She is encouraged to increase her fruit and vegetable intake.  She is not vomiting.  At this point she is not remarkably acidotic and does not seem to require obvious admission.  No evidence of pulmonary embolus.  Chest CT is grossly normal.  Other screening lab studies are grossly normal.  She is encouraged to decrease her alcohol intake and will plan for discharge..      Reexamination/ Reevaluation   Vital signs   Basic Oxygen Information   03/27/2020 20:10 EDT Oxygen Therapy Room air    SpO2 98 %         Impression and Plan   Diagnosis   Dyspnea (ICD10-CM R06.00, Discharge, Medical)   Alcohol use disorder, moderate, dependence (ICD10-CM F10.20, Discharge, Medical)   Alcoholic ketoacidosis (ICD10-CM E87.2, Discharge, Medical)   Plan   Condition: Improved.    Disposition: Discharged: to home.    Patient was given the following educational materials: Shortness of Breath, Easy-to-Read, Alcohol Use Disorder.    Follow up with: Follow up with primary care provider Within 1  week Make sure you are eating some food for calories and nutrition.  Drink plenty of nonalcoholic fluids.  Follow-up with primary care provider at your earliest convenience.  Return immediately if worse or any change.    Thank you for visiting with us  today and trusting [facility] with your emergency health care needs.  Unless otherwise instructed, you should follow up with your primary care provider at your earliest convenience to discuss your ER visit, all findings, and for re-evaluation.  If you do not have a primary care provider, call [phone] later today or tomorrow to obtain one.  If at any point in the meantime your symptoms change, or become worse, you need to seek re-evaluation in the Emergency Department.    .    Counseled: Patient, Regarding diagnosis, Regarding diagnostic results, At the time of discharge, I went back and reevaluated the patient. I repeated the exam. I carefully explained all of the test results, and answered any and all questions. Patient indicates complete understanding..    Notes: This note was created using voice recognition software and may contain typographic errors missed during final review.  The intent is to have a complete and accurate medical record, though some errors will occur due to the nature of the software and program.    As a valued partner in the safety effort, if you have noticed factual errors, please complete the health information amendment/correct form or call the [facility] Health Information Managment Office at [phone].  SABRA

## 2020-03-28 LAB — CBC WITH AUTO DIFFERENTIAL
Absolute Baso #: 0.1 10*3/uL (ref 0.0–0.2)
Absolute Eos #: 0.1 10*3/uL (ref 0.0–0.5)
Absolute Lymph #: 2.5 10*3/uL (ref 1.0–3.2)
Absolute Mono #: 0.6 10*3/uL (ref 0.3–1.0)
Basophils %: 1.2 % (ref 0.0–2.0)
Eosinophils %: 1.2 % (ref 0.0–7.0)
Hematocrit: 39.6 % (ref 34.0–47.0)
Hemoglobin: 12.5 g/dL (ref 11.5–15.7)
Immature Grans (Abs): 0.04 10*3/uL (ref 0.00–0.06)
Immature Granulocytes: 0.6 % (ref 0.1–0.6)
Lymphocytes: 37.5 % (ref 15.0–45.0)
MCH: 30 pg (ref 27.0–34.5)
MCHC: 31.6 g/dL — ABNORMAL LOW (ref 32.0–36.0)
MCV: 95.2 fL (ref 81.0–99.0)
MPV: 10 fL (ref 7.2–13.2)
Monocytes: 8.7 % (ref 4.0–12.0)
NRBC Absolute: 0 10*3/uL (ref 0.000–0.012)
NRBC Automated: 0 % (ref 0.0–0.2)
Neutrophils %: 50.8 % (ref 42.0–74.0)
Neutrophils Absolute: 3.4 10*3/uL (ref 1.6–7.3)
Platelets: 217 10*3/uL (ref 140–440)
RBC: 4.16 x10e6/mcL (ref 3.60–5.20)
RDW: 20.2 % — ABNORMAL HIGH (ref 11.0–16.0)
WBC: 6.7 10*3/uL (ref 3.8–10.6)

## 2020-03-28 LAB — MORPHOLOGY CHECK
Platelet Estimate: ADEQUATE
RBC Morphology: ABNORMAL — AB

## 2020-03-28 LAB — COMPREHENSIVE METABOLIC PANEL
ALT: 133 U/L — ABNORMAL HIGH (ref 0–33)
AST: 202 U/L — ABNORMAL HIGH (ref 0–32)
Albumin/Globulin Ratio: 1.24 mmol/L (ref 1.00–2.00)
Albumin: 4.1 g/dL (ref 3.5–5.2)
Alk Phosphatase: 161 U/L — ABNORMAL HIGH (ref 35–117)
Anion Gap: 17 mmol/L (ref 2–17)
BUN: 7 mg/dL — ABNORMAL LOW (ref 8–23)
CALCIUM,CORRECTED,CCA: 8.8 mg/dL (ref 8.8–10.2)
CO2: 16 mmol/L — ABNORMAL LOW (ref 22–29)
Calcium: 8.9 mg/dL (ref 8.8–10.2)
Chloride: 102 mmol/L (ref 98–107)
Creatinine: 0.6 mg/dL (ref 0.5–1.0)
GFR African American: 112 mL/min/{1.73_m2} (ref >=90)
GFR Non-African American: 97 mL/min/{1.73_m2} (ref >=90)
Globulin: 3.3 g/dL (ref 1.9–4.4)
Glucose: 107 mg/dL — ABNORMAL HIGH (ref 70–99)
OSMOLALITY CALCULATED: 269 mOsm/kg — ABNORMAL LOW (ref 270–287)
Potassium: 3.8 mmol/L (ref 3.5–5.3)
Sodium: 135 mmol/L (ref 135–145)
Total Bilirubin: 0.41 mg/dL (ref 0.00–1.20)
Total Protein: 7.4 g/dL (ref 6.4–8.3)

## 2020-03-28 LAB — COVID-19, SURVEILLANCE (ASYMPTOMATIC/NO EXPOSURE, OR TEST OF CURE)
Lot/Kit Number: 706422
SARS Cov2 Ag FIA: NEGATIVE

## 2020-03-28 LAB — ADD ON LAB TEST

## 2020-03-28 LAB — CK: Total CK: 95 U/L (ref 20–180)

## 2020-03-28 LAB — TROPONIN T: Troponin T: <0.01 ng/mL (ref 0.000–0.010)

## 2020-03-28 LAB — N TERMINAL PROBNP (AKA NTPROBNP): NT Pro-BNP: 158 pg/mL — ABNORMAL HIGH (ref 0–125)

## 2020-03-28 LAB — ETHANOL: Ethanol Lvl: 241 mg/dL — ABNORMAL HIGH (ref 0.0–10.0)
